# Patient Record
Sex: Female | Born: 1945
Health system: Southern US, Community
[De-identification: ages and names within clinical notes are randomized; demographics above are authoritative.]

## PROBLEM LIST (undated history)

## (undated) DIAGNOSIS — I10 Essential (primary) hypertension: Secondary | ICD-10-CM

## (undated) DIAGNOSIS — M545 Low back pain, unspecified: Secondary | ICD-10-CM

## (undated) DIAGNOSIS — R7309 Other abnormal glucose: Secondary | ICD-10-CM

## (undated) DIAGNOSIS — M5430 Sciatica, unspecified side: Secondary | ICD-10-CM

## (undated) DIAGNOSIS — E7800 Pure hypercholesterolemia, unspecified: Secondary | ICD-10-CM

## (undated) DIAGNOSIS — E559 Vitamin D deficiency, unspecified: Secondary | ICD-10-CM

## (undated) DIAGNOSIS — M810 Age-related osteoporosis without current pathological fracture: Secondary | ICD-10-CM

## (undated) HISTORY — DX: Essential (primary) hypertension: I10

## (undated) HISTORY — DX: Low back pain: M54.5

## (undated) HISTORY — DX: Vitamin D deficiency, unspecified: E55.9

## (undated) HISTORY — PX: TUBAL LIGATION: SHX77

## (undated) HISTORY — DX: Low back pain, unspecified: M54.50

## (undated) HISTORY — DX: Pure hypercholesterolemia, unspecified: E78.00

## (undated) HISTORY — DX: Other abnormal glucose: R73.09

## (undated) HISTORY — DX: Age-related osteoporosis without current pathological fracture: M81.0

## (undated) HISTORY — DX: Sciatica, unspecified side: M54.30

---

## 2007-12-22 ENCOUNTER — Emergency Department (HOSPITAL_COMMUNITY): Admission: EM | Admit: 2007-12-22 | Discharge: 2007-12-22 | Payer: Self-pay | Admitting: Emergency Medicine

## 2010-12-07 LAB — DIFFERENTIAL
Eosinophils Absolute: 0
Eosinophils Relative: 1
Lymphs Abs: 1.6
Monocytes Absolute: 0.4
Monocytes Relative: 6

## 2010-12-07 LAB — CBC
HCT: 43.7
Hemoglobin: 14.2
MCV: 91.1
RBC: 4.8
RDW: 13.4
WBC: 6.5

## 2010-12-07 LAB — POCT CARDIAC MARKERS
CKMB, poc: 1.4
Myoglobin, poc: 56.8
Troponin i, poc: <0.05

## 2010-12-07 LAB — POCT I-STAT, CHEM 8
Glucose, Bld: 112 — ABNORMAL HIGH
Potassium: 3.1 — ABNORMAL LOW
Sodium: 143
TCO2: 27

## 2010-12-07 LAB — URINALYSIS, ROUTINE W REFLEX MICROSCOPIC
Nitrite: NEGATIVE
Specific Gravity, Urine: 1.009
Urobilinogen, UA: 0.2

## 2010-12-07 LAB — URINE MICROSCOPIC-ADD ON

## 2012-02-15 DIAGNOSIS — E785 Hyperlipidemia, unspecified: Secondary | ICD-10-CM | POA: Diagnosis not present

## 2012-02-15 DIAGNOSIS — Z79899 Other long term (current) drug therapy: Secondary | ICD-10-CM | POA: Diagnosis not present

## 2012-02-15 DIAGNOSIS — I10 Essential (primary) hypertension: Secondary | ICD-10-CM | POA: Diagnosis not present

## 2012-02-21 DIAGNOSIS — I10 Essential (primary) hypertension: Secondary | ICD-10-CM | POA: Diagnosis not present

## 2012-03-29 DIAGNOSIS — I10 Essential (primary) hypertension: Secondary | ICD-10-CM | POA: Diagnosis not present

## 2012-03-29 DIAGNOSIS — R002 Palpitations: Secondary | ICD-10-CM | POA: Diagnosis not present

## 2012-03-29 DIAGNOSIS — R0602 Shortness of breath: Secondary | ICD-10-CM | POA: Diagnosis not present

## 2012-03-29 DIAGNOSIS — I4510 Unspecified right bundle-branch block: Secondary | ICD-10-CM | POA: Diagnosis not present

## 2012-04-09 DIAGNOSIS — Q211 Atrial septal defect: Secondary | ICD-10-CM | POA: Diagnosis not present

## 2012-04-09 DIAGNOSIS — I4510 Unspecified right bundle-branch block: Secondary | ICD-10-CM | POA: Diagnosis not present

## 2012-04-09 DIAGNOSIS — R0602 Shortness of breath: Secondary | ICD-10-CM | POA: Diagnosis not present

## 2012-04-12 DIAGNOSIS — I4510 Unspecified right bundle-branch block: Secondary | ICD-10-CM | POA: Diagnosis not present

## 2012-04-12 DIAGNOSIS — R0602 Shortness of breath: Secondary | ICD-10-CM | POA: Diagnosis not present

## 2012-04-12 DIAGNOSIS — Q211 Atrial septal defect: Secondary | ICD-10-CM | POA: Diagnosis not present

## 2012-04-18 DIAGNOSIS — R0602 Shortness of breath: Secondary | ICD-10-CM | POA: Diagnosis not present

## 2012-04-18 DIAGNOSIS — E785 Hyperlipidemia, unspecified: Secondary | ICD-10-CM | POA: Diagnosis not present

## 2012-04-18 DIAGNOSIS — Z8249 Family history of ischemic heart disease and other diseases of the circulatory system: Secondary | ICD-10-CM | POA: Diagnosis not present

## 2012-04-18 DIAGNOSIS — Z79899 Other long term (current) drug therapy: Secondary | ICD-10-CM | POA: Diagnosis not present

## 2012-04-18 DIAGNOSIS — R002 Palpitations: Secondary | ICD-10-CM | POA: Diagnosis not present

## 2012-04-18 DIAGNOSIS — I10 Essential (primary) hypertension: Secondary | ICD-10-CM | POA: Diagnosis not present

## 2012-05-01 DIAGNOSIS — I10 Essential (primary) hypertension: Secondary | ICD-10-CM | POA: Diagnosis not present

## 2012-05-01 DIAGNOSIS — R0602 Shortness of breath: Secondary | ICD-10-CM | POA: Diagnosis not present

## 2012-05-16 DIAGNOSIS — I10 Essential (primary) hypertension: Secondary | ICD-10-CM | POA: Diagnosis not present

## 2012-05-30 DIAGNOSIS — I10 Essential (primary) hypertension: Secondary | ICD-10-CM | POA: Diagnosis not present

## 2012-05-30 DIAGNOSIS — M5430 Sciatica, unspecified side: Secondary | ICD-10-CM | POA: Diagnosis not present

## 2012-05-30 DIAGNOSIS — M545 Low back pain: Secondary | ICD-10-CM | POA: Diagnosis not present

## 2012-07-12 DIAGNOSIS — IMO0002 Reserved for concepts with insufficient information to code with codable children: Secondary | ICD-10-CM | POA: Diagnosis not present

## 2012-07-12 DIAGNOSIS — M545 Low back pain: Secondary | ICD-10-CM | POA: Diagnosis not present

## 2012-07-12 DIAGNOSIS — M5430 Sciatica, unspecified side: Secondary | ICD-10-CM | POA: Diagnosis not present

## 2012-07-12 DIAGNOSIS — I10 Essential (primary) hypertension: Secondary | ICD-10-CM | POA: Diagnosis not present

## 2012-07-26 DIAGNOSIS — M545 Low back pain: Secondary | ICD-10-CM | POA: Diagnosis not present

## 2012-07-26 DIAGNOSIS — I10 Essential (primary) hypertension: Secondary | ICD-10-CM | POA: Diagnosis not present

## 2012-08-08 ENCOUNTER — Ambulatory Visit (INDEPENDENT_AMBULATORY_CARE_PROVIDER_SITE_OTHER): Payer: Medicare Other | Admitting: Neurology

## 2012-08-08 ENCOUNTER — Encounter: Payer: Self-pay | Admitting: Neurology

## 2012-08-08 VITALS — BP 148/88 | HR 79 | Ht 64.0 in | Wt 150.0 lb

## 2012-08-08 DIAGNOSIS — M545 Low back pain, unspecified: Secondary | ICD-10-CM

## 2012-08-08 MED ORDER — DICLOFENAC SODIUM 1 % TD GEL: 4.0000 g | Freq: Four times a day (QID) | TRANSDERMAL | Status: DC

## 2012-08-08 NOTE — Progress Notes (Signed)
History of present illness: Julia Gates is a 67 years old right-handed African American female, accompanied by her husband, referred by her primary care physician Dr. Andi Devon for evaluation of low back pain  She denies previous history of low back pain, in March 2014, while travelling, she dragged a heavy suitcase crossing the belt, twisted her right back, 2 days later, she began to experience excruciating right-sided low back pain, radiating to her right buttock area, also along right lateral thigh, lateral leg, to right ankle, involving right lateral 3 toes, The intense pain lasts for couple months, was helped by celebrax and Percocet ,she has gait difficulty attributed to pain,  She denied left t leg symptoms, denied bowel bladder incontinence, she reported 50% improvement now  Review of Systems  Out of a complete 14 system review, the patient complains of only the following symptoms, and all other reviewed systems are negative.   Constitutional:   N/A Cardiovascular:  Palpitation Nose/Throat:  N/A Skin:  birthmark, moles  Eyes: N/A Respiratory:  snoring  Gastroitestinal: N/A    Hematology/Lymphatic:  N/A Endocrine:  N/A Musculoskeletal: joints pain  Allergy/Immunology: N/A Neurological:  dizziness, snoring Psychiatric:    N/A  PHYSICAL EXAMINATOINS:  Generalized: In no acute distress  Neck: Supple, no carotid bruits   Cardiac: Regular rate rhythm  Pulmonary: Clear to auscultation bilaterally  Musculoskeletal: No deformity  Neurological examination  Mentation: Alert oriented to time, place, history taking, and causual conversation  Cranial nerve II-XII: Pupils were equal round reactive to light extraocular movements were full, visual field were full on confrontational test. facial sensation and strength were normal. hearing was intact to finger rubbing bilaterally. Uvula tongue midline.  head turning and shoulder shrug and were normal and symmetric.Tongue protrusion into  cheek strength was normal.  Motor: normal tone, bulk and strength, with exception of mild right toe extension, right ankle dorsi flexion, eversion weakness   Sensory: Intact to fine touch, pinprick, preserved vibratory sensation, and proprioception at toes.  Coordination: Normal finger to nose, heel-to-shin bilaterally there was no truncal ataxia  Gait: atalgic due to right back pain, she has difficulty with right heel walking, able to walk on tiptoe  Romberg signs: Negative  Deep tendon reflexes: Brachioradialis 2/2, biceps 2/2, triceps 2/2, patellar 2/2, Achilles 2/2, plantar responses were flexor bilaterally.  Assessment and plan:    67 years old [place] American female, with acute onset of right-sided low back pain, radiating pain to her right lateral leg, positive right straight leg testing, preserved bilaterally ankle reflexes, mild right ankle dorsi flexion, toe flexion, ankle eversion weakness,   1. Most consistent with acute right L5  lumbar radiculopathy 2. MRI lumbar, EMG nerve conduction study 3. Celexa, Percocet as needed, Voltaren gel, hot compression  4. RTC

## 2012-08-16 DIAGNOSIS — Z01419 Encounter for gynecological examination (general) (routine) without abnormal findings: Secondary | ICD-10-CM | POA: Diagnosis not present

## 2012-08-16 DIAGNOSIS — E559 Vitamin D deficiency, unspecified: Secondary | ICD-10-CM | POA: Diagnosis not present

## 2012-08-16 DIAGNOSIS — E785 Hyperlipidemia, unspecified: Secondary | ICD-10-CM | POA: Diagnosis not present

## 2012-08-16 DIAGNOSIS — M5430 Sciatica, unspecified side: Secondary | ICD-10-CM | POA: Diagnosis not present

## 2012-08-16 DIAGNOSIS — R7309 Other abnormal glucose: Secondary | ICD-10-CM | POA: Diagnosis not present

## 2012-08-16 DIAGNOSIS — Z124 Encounter for screening for malignant neoplasm of cervix: Secondary | ICD-10-CM | POA: Diagnosis not present

## 2012-08-16 DIAGNOSIS — Z23 Encounter for immunization: Secondary | ICD-10-CM | POA: Diagnosis not present

## 2012-08-16 DIAGNOSIS — I10 Essential (primary) hypertension: Secondary | ICD-10-CM | POA: Diagnosis not present

## 2012-08-16 DIAGNOSIS — Z0000 Encounter for general adult medical examination without abnormal findings: Secondary | ICD-10-CM | POA: Diagnosis not present

## 2012-08-19 ENCOUNTER — Ambulatory Visit
Admission: RE | Admit: 2012-08-19 | Discharge: 2012-08-19 | Disposition: A | Discharge status: Home / Self Care | Payer: Medicare Other | Source: Ambulatory Visit | Attending: Neurology | Admitting: Neurology

## 2012-08-19 DIAGNOSIS — M545 Low back pain: Secondary | ICD-10-CM

## 2012-08-20 ENCOUNTER — Encounter (INDEPENDENT_AMBULATORY_CARE_PROVIDER_SITE_OTHER): Payer: Medicare Other | Admitting: Radiology

## 2012-08-20 ENCOUNTER — Ambulatory Visit (INDEPENDENT_AMBULATORY_CARE_PROVIDER_SITE_OTHER): Payer: Medicare Other | Admitting: Neurology

## 2012-08-20 DIAGNOSIS — M545 Low back pain: Secondary | ICD-10-CM | POA: Diagnosis not present

## 2012-08-20 DIAGNOSIS — Z0289 Encounter for other administrative examinations: Secondary | ICD-10-CM

## 2012-08-20 NOTE — Procedures (Signed)
History of present illness: 67 years old African American female complains of right-sided low back pain, radiating pain to right lower extremity since March 2014.  Nerve conduction studies:  Bilateral sural sensory responses were normal, bilateral peroneal sensory responses were normal. Bilateral tibial motor responses were normal. Tibial H reflexes were normal and symmetric symmetric. Right peroneal to EDB motor responses were normal. Left peroneal to EDB motor response showed mildly decreased CMAP amplitude, with normal distal latency, conduction velocity, and F wave latency.  Electromyography:  Selected needle examination was performed at right lower extremity muscles, and right lumbosacral paraspinal muscles.  Needle examination of right tibialis anterior, tibialis posterior, medial gastrocnemius, peroneal longus, vastus lateralis, biceps femoris short head was normal.  There was no spontaneous activity at right lumbosacral paraspinal muscles, right L4, 5, S1.   IN CONCLUSION: This is a normal study. There is no electrodiagnostic evidence of large fiber peripheral neuropathy, right lower extremity neuropathy, or right lumbosacral radiculopathy.

## 2012-08-23 ENCOUNTER — Telehealth: Payer: Self-pay | Admitting: Neurology

## 2012-08-23 NOTE — Telephone Encounter (Signed)
I have left message for her   At L5-S1: Pseudo-disc bulging, facet arthropathy with severe right and moderate left foraminal stenosis; no spinal stenosis; mild anterior spondylolisthesis of L5 on S1 measuring 3 mm.  2. At L1-2, L2-3, L3-4, L4-5: disc bulging, facet hypertrophic mild bilateral foraminal stenosis; no spinal stenosis. 3. Incidental right renal cyst (9 mm).  Please call her again, also check on her low back pain, if she continues to have low back pain, may refer her to [facility] pain management

## 2012-08-29 DIAGNOSIS — R0602 Shortness of breath: Secondary | ICD-10-CM | POA: Diagnosis not present

## 2012-08-29 DIAGNOSIS — I4510 Unspecified right bundle-branch block: Secondary | ICD-10-CM | POA: Diagnosis not present

## 2012-08-29 DIAGNOSIS — I10 Essential (primary) hypertension: Secondary | ICD-10-CM | POA: Diagnosis not present

## 2012-08-30 ENCOUNTER — Encounter: Payer: Self-pay | Admitting: Neurology

## 2012-08-31 ENCOUNTER — Telehealth: Payer: Self-pay | Admitting: Neurology

## 2012-08-31 DIAGNOSIS — M5416 Radiculopathy, lumbar region: Secondary | ICD-10-CM

## 2012-08-31 NOTE — Telephone Encounter (Signed)
I have left message :

## 2012-09-03 ENCOUNTER — Telehealth: Payer: Self-pay | Admitting: Neurology

## 2012-09-03 NOTE — Telephone Encounter (Signed)
I have left message 3rd time for her to call back.

## 2012-09-04 ENCOUNTER — Telehealth: Payer: Self-pay | Admitting: Neurology

## 2012-09-04 NOTE — Telephone Encounter (Signed)
Message copied by Levert Feinstein on Tue Sep 04, 2012  5:32 PM ------      Message from: Warren Lacy A      Created: Tue Sep 04, 2012 11:05 AM       Returning your call about her MRI ------

## 2012-09-04 NOTE — Telephone Encounter (Signed)
I have called her and explained MRI findings to her, she will see orthopedic surgeon in July 8th 2014.Marland Kitchen

## 2012-09-11 ENCOUNTER — Ambulatory Visit: Payer: Medicare Other

## 2012-10-10 ENCOUNTER — Other Ambulatory Visit: Payer: Self-pay

## 2012-10-30 DIAGNOSIS — R110 Nausea: Secondary | ICD-10-CM | POA: Diagnosis not present

## 2012-10-30 DIAGNOSIS — Z1211 Encounter for screening for malignant neoplasm of colon: Secondary | ICD-10-CM | POA: Diagnosis not present

## 2012-10-30 DIAGNOSIS — Z800 Family history of malignant neoplasm of digestive organs: Secondary | ICD-10-CM | POA: Diagnosis not present

## 2013-01-10 ENCOUNTER — Other Ambulatory Visit: Payer: Self-pay

## 2013-02-26 DIAGNOSIS — I10 Essential (primary) hypertension: Secondary | ICD-10-CM | POA: Diagnosis not present

## 2013-02-26 DIAGNOSIS — Z79899 Other long term (current) drug therapy: Secondary | ICD-10-CM | POA: Diagnosis not present

## 2013-02-26 DIAGNOSIS — R7309 Other abnormal glucose: Secondary | ICD-10-CM | POA: Diagnosis not present

## 2013-02-26 DIAGNOSIS — M25519 Pain in unspecified shoulder: Secondary | ICD-10-CM | POA: Diagnosis not present

## 2013-08-28 DIAGNOSIS — R0602 Shortness of breath: Secondary | ICD-10-CM | POA: Diagnosis not present

## 2013-08-28 DIAGNOSIS — I10 Essential (primary) hypertension: Secondary | ICD-10-CM | POA: Diagnosis not present

## 2013-08-28 DIAGNOSIS — I4510 Unspecified right bundle-branch block: Secondary | ICD-10-CM | POA: Diagnosis not present

## 2013-08-28 DIAGNOSIS — E785 Hyperlipidemia, unspecified: Secondary | ICD-10-CM | POA: Diagnosis not present

## 2013-09-27 DIAGNOSIS — I10 Essential (primary) hypertension: Secondary | ICD-10-CM | POA: Diagnosis not present

## 2013-09-27 DIAGNOSIS — R0602 Shortness of breath: Secondary | ICD-10-CM | POA: Diagnosis not present

## 2013-09-27 DIAGNOSIS — E785 Hyperlipidemia, unspecified: Secondary | ICD-10-CM | POA: Diagnosis not present

## 2013-10-02 DIAGNOSIS — I10 Essential (primary) hypertension: Secondary | ICD-10-CM | POA: Diagnosis not present

## 2013-10-02 DIAGNOSIS — E785 Hyperlipidemia, unspecified: Secondary | ICD-10-CM | POA: Diagnosis not present

## 2013-10-02 DIAGNOSIS — R0602 Shortness of breath: Secondary | ICD-10-CM | POA: Diagnosis not present

## 2013-11-14 DIAGNOSIS — R0602 Shortness of breath: Secondary | ICD-10-CM | POA: Diagnosis not present

## 2013-11-14 DIAGNOSIS — I10 Essential (primary) hypertension: Secondary | ICD-10-CM | POA: Diagnosis not present

## 2013-12-02 DIAGNOSIS — I10 Essential (primary) hypertension: Secondary | ICD-10-CM | POA: Diagnosis not present

## 2013-12-05 DIAGNOSIS — I4510 Unspecified right bundle-branch block: Secondary | ICD-10-CM | POA: Diagnosis not present

## 2013-12-05 DIAGNOSIS — R0602 Shortness of breath: Secondary | ICD-10-CM | POA: Diagnosis not present

## 2013-12-05 DIAGNOSIS — I10 Essential (primary) hypertension: Secondary | ICD-10-CM | POA: Diagnosis not present

## 2013-12-09 DIAGNOSIS — N182 Chronic kidney disease, stage 2 (mild): Secondary | ICD-10-CM | POA: Diagnosis not present

## 2013-12-09 DIAGNOSIS — I129 Hypertensive chronic kidney disease with stage 1 through stage 4 chronic kidney disease, or unspecified chronic kidney disease: Secondary | ICD-10-CM | POA: Diagnosis not present

## 2013-12-09 DIAGNOSIS — I10 Essential (primary) hypertension: Secondary | ICD-10-CM | POA: Diagnosis not present

## 2013-12-09 DIAGNOSIS — R0602 Shortness of breath: Secondary | ICD-10-CM | POA: Diagnosis not present

## 2013-12-12 DIAGNOSIS — R400 Somnolence: Secondary | ICD-10-CM | POA: Diagnosis not present

## 2013-12-16 DIAGNOSIS — R400 Somnolence: Secondary | ICD-10-CM | POA: Diagnosis not present

## 2013-12-31 DIAGNOSIS — I10 Essential (primary) hypertension: Secondary | ICD-10-CM | POA: Diagnosis not present

## 2014-01-09 DIAGNOSIS — I10 Essential (primary) hypertension: Secondary | ICD-10-CM | POA: Diagnosis not present

## 2014-01-17 DIAGNOSIS — I10 Essential (primary) hypertension: Secondary | ICD-10-CM | POA: Diagnosis not present

## 2014-01-17 DIAGNOSIS — G4734 Idiopathic sleep related nonobstructive alveolar hypoventilation: Secondary | ICD-10-CM | POA: Diagnosis not present

## 2014-01-17 DIAGNOSIS — N182 Chronic kidney disease, stage 2 (mild): Secondary | ICD-10-CM | POA: Diagnosis not present

## 2014-01-17 DIAGNOSIS — I129 Hypertensive chronic kidney disease with stage 1 through stage 4 chronic kidney disease, or unspecified chronic kidney disease: Secondary | ICD-10-CM | POA: Diagnosis not present

## 2014-03-11 DIAGNOSIS — I10 Essential (primary) hypertension: Secondary | ICD-10-CM | POA: Diagnosis not present

## 2014-03-11 DIAGNOSIS — E780 Pure hypercholesterolemia: Secondary | ICD-10-CM | POA: Diagnosis not present

## 2014-03-20 DIAGNOSIS — I10 Essential (primary) hypertension: Secondary | ICD-10-CM | POA: Diagnosis not present

## 2014-03-20 DIAGNOSIS — N182 Chronic kidney disease, stage 2 (mild): Secondary | ICD-10-CM | POA: Diagnosis not present

## 2014-03-20 DIAGNOSIS — I701 Atherosclerosis of renal artery: Secondary | ICD-10-CM | POA: Diagnosis not present

## 2014-03-20 DIAGNOSIS — I129 Hypertensive chronic kidney disease with stage 1 through stage 4 chronic kidney disease, or unspecified chronic kidney disease: Secondary | ICD-10-CM | POA: Diagnosis not present

## 2014-09-18 DIAGNOSIS — I10 Essential (primary) hypertension: Secondary | ICD-10-CM | POA: Diagnosis not present

## 2014-09-18 DIAGNOSIS — E780 Pure hypercholesterolemia: Secondary | ICD-10-CM | POA: Diagnosis not present

## 2014-09-25 DIAGNOSIS — R0602 Shortness of breath: Secondary | ICD-10-CM | POA: Diagnosis not present

## 2014-09-25 DIAGNOSIS — I129 Hypertensive chronic kidney disease with stage 1 through stage 4 chronic kidney disease, or unspecified chronic kidney disease: Secondary | ICD-10-CM | POA: Diagnosis not present

## 2014-09-25 DIAGNOSIS — E780 Pure hypercholesterolemia: Secondary | ICD-10-CM | POA: Diagnosis not present

## 2014-09-25 DIAGNOSIS — I10 Essential (primary) hypertension: Secondary | ICD-10-CM | POA: Diagnosis not present

## 2014-12-10 DIAGNOSIS — D519 Vitamin B12 deficiency anemia, unspecified: Secondary | ICD-10-CM | POA: Diagnosis not present

## 2014-12-10 DIAGNOSIS — E559 Vitamin D deficiency, unspecified: Secondary | ICD-10-CM | POA: Diagnosis not present

## 2014-12-10 DIAGNOSIS — Z8249 Family history of ischemic heart disease and other diseases of the circulatory system: Secondary | ICD-10-CM | POA: Diagnosis not present

## 2014-12-10 DIAGNOSIS — Z1239 Encounter for other screening for malignant neoplasm of breast: Secondary | ICD-10-CM | POA: Diagnosis not present

## 2014-12-10 DIAGNOSIS — Z23 Encounter for immunization: Secondary | ICD-10-CM | POA: Diagnosis not present

## 2014-12-10 DIAGNOSIS — I10 Essential (primary) hypertension: Secondary | ICD-10-CM | POA: Diagnosis not present

## 2014-12-10 DIAGNOSIS — Z1211 Encounter for screening for malignant neoplasm of colon: Secondary | ICD-10-CM | POA: Diagnosis not present

## 2014-12-10 DIAGNOSIS — R7309 Other abnormal glucose: Secondary | ICD-10-CM | POA: Diagnosis not present

## 2014-12-10 DIAGNOSIS — Z1389 Encounter for screening for other disorder: Secondary | ICD-10-CM | POA: Diagnosis not present

## 2014-12-22 ENCOUNTER — Other Ambulatory Visit: Payer: Self-pay

## 2014-12-22 DIAGNOSIS — Z1231 Encounter for screening mammogram for malignant neoplasm of breast: Secondary | ICD-10-CM

## 2015-02-09 ENCOUNTER — Ambulatory Visit
Admission: RE | Admit: 2015-02-09 | Discharge: 2015-02-09 | Disposition: A | Discharge status: Home / Self Care | Payer: Medicare Other | Source: Ambulatory Visit

## 2015-02-09 DIAGNOSIS — Z1231 Encounter for screening mammogram for malignant neoplasm of breast: Secondary | ICD-10-CM

## 2015-04-15 DIAGNOSIS — E784 Other hyperlipidemia: Secondary | ICD-10-CM | POA: Diagnosis not present

## 2015-04-15 DIAGNOSIS — E119 Type 2 diabetes mellitus without complications: Secondary | ICD-10-CM | POA: Diagnosis not present

## 2015-04-15 DIAGNOSIS — I10 Essential (primary) hypertension: Secondary | ICD-10-CM | POA: Diagnosis not present

## 2015-04-23 DIAGNOSIS — I129 Hypertensive chronic kidney disease with stage 1 through stage 4 chronic kidney disease, or unspecified chronic kidney disease: Secondary | ICD-10-CM | POA: Diagnosis not present

## 2015-04-23 DIAGNOSIS — I10 Essential (primary) hypertension: Secondary | ICD-10-CM | POA: Diagnosis not present

## 2015-04-23 DIAGNOSIS — N181 Chronic kidney disease, stage 1: Secondary | ICD-10-CM | POA: Diagnosis not present

## 2015-04-23 DIAGNOSIS — I701 Atherosclerosis of renal artery: Secondary | ICD-10-CM | POA: Diagnosis not present

## 2015-05-13 DIAGNOSIS — I10 Essential (primary) hypertension: Secondary | ICD-10-CM | POA: Diagnosis not present

## 2015-06-05 DIAGNOSIS — R079 Chest pain, unspecified: Secondary | ICD-10-CM | POA: Diagnosis not present

## 2015-06-05 DIAGNOSIS — R0602 Shortness of breath: Secondary | ICD-10-CM | POA: Diagnosis not present

## 2015-06-05 DIAGNOSIS — I10 Essential (primary) hypertension: Secondary | ICD-10-CM | POA: Diagnosis not present

## 2015-06-05 DIAGNOSIS — I701 Atherosclerosis of renal artery: Secondary | ICD-10-CM | POA: Diagnosis not present

## 2015-06-15 DIAGNOSIS — R0789 Other chest pain: Secondary | ICD-10-CM | POA: Diagnosis not present

## 2015-06-15 DIAGNOSIS — R0602 Shortness of breath: Secondary | ICD-10-CM | POA: Diagnosis not present

## 2015-06-25 DIAGNOSIS — R0602 Shortness of breath: Secondary | ICD-10-CM | POA: Diagnosis not present

## 2015-06-25 DIAGNOSIS — E119 Type 2 diabetes mellitus without complications: Secondary | ICD-10-CM | POA: Diagnosis not present

## 2015-06-25 DIAGNOSIS — I10 Essential (primary) hypertension: Secondary | ICD-10-CM | POA: Diagnosis not present

## 2015-06-25 DIAGNOSIS — R079 Chest pain, unspecified: Secondary | ICD-10-CM | POA: Diagnosis not present

## 2015-07-15 DIAGNOSIS — I10 Essential (primary) hypertension: Secondary | ICD-10-CM | POA: Diagnosis not present

## 2015-07-15 DIAGNOSIS — R7309 Other abnormal glucose: Secondary | ICD-10-CM | POA: Diagnosis not present

## 2015-07-15 DIAGNOSIS — E784 Other hyperlipidemia: Secondary | ICD-10-CM | POA: Diagnosis not present

## 2015-07-15 DIAGNOSIS — R0600 Dyspnea, unspecified: Secondary | ICD-10-CM | POA: Diagnosis not present

## 2015-10-21 DIAGNOSIS — I10 Essential (primary) hypertension: Secondary | ICD-10-CM | POA: Diagnosis not present

## 2015-10-21 DIAGNOSIS — E119 Type 2 diabetes mellitus without complications: Secondary | ICD-10-CM | POA: Diagnosis not present

## 2015-12-16 DIAGNOSIS — Z1211 Encounter for screening for malignant neoplasm of colon: Secondary | ICD-10-CM | POA: Diagnosis not present

## 2015-12-16 DIAGNOSIS — Z13820 Encounter for screening for osteoporosis: Secondary | ICD-10-CM | POA: Diagnosis not present

## 2015-12-16 DIAGNOSIS — R7309 Other abnormal glucose: Secondary | ICD-10-CM | POA: Diagnosis not present

## 2015-12-16 DIAGNOSIS — Z23 Encounter for immunization: Secondary | ICD-10-CM | POA: Diagnosis not present

## 2015-12-16 DIAGNOSIS — Z0000 Encounter for general adult medical examination without abnormal findings: Secondary | ICD-10-CM | POA: Diagnosis not present

## 2015-12-16 DIAGNOSIS — I10 Essential (primary) hypertension: Secondary | ICD-10-CM | POA: Diagnosis not present

## 2015-12-21 DIAGNOSIS — R0602 Shortness of breath: Secondary | ICD-10-CM | POA: Diagnosis not present

## 2015-12-21 DIAGNOSIS — E119 Type 2 diabetes mellitus without complications: Secondary | ICD-10-CM | POA: Diagnosis not present

## 2015-12-21 DIAGNOSIS — I10 Essential (primary) hypertension: Secondary | ICD-10-CM | POA: Diagnosis not present

## 2015-12-21 DIAGNOSIS — E785 Hyperlipidemia, unspecified: Secondary | ICD-10-CM | POA: Diagnosis not present

## 2016-01-21 DIAGNOSIS — R152 Fecal urgency: Secondary | ICD-10-CM | POA: Diagnosis not present

## 2016-01-21 DIAGNOSIS — R194 Change in bowel habit: Secondary | ICD-10-CM | POA: Diagnosis not present

## 2016-01-21 DIAGNOSIS — R140 Abdominal distension (gaseous): Secondary | ICD-10-CM | POA: Diagnosis not present

## 2016-01-21 DIAGNOSIS — Z1211 Encounter for screening for malignant neoplasm of colon: Secondary | ICD-10-CM | POA: Diagnosis not present

## 2016-01-21 DIAGNOSIS — K219 Gastro-esophageal reflux disease without esophagitis: Secondary | ICD-10-CM | POA: Diagnosis not present

## 2016-03-16 DIAGNOSIS — Z9114 Patient's other noncompliance with medication regimen: Secondary | ICD-10-CM | POA: Diagnosis not present

## 2016-03-16 DIAGNOSIS — E119 Type 2 diabetes mellitus without complications: Secondary | ICD-10-CM | POA: Diagnosis not present

## 2016-03-16 DIAGNOSIS — I10 Essential (primary) hypertension: Secondary | ICD-10-CM | POA: Diagnosis not present

## 2016-04-06 DIAGNOSIS — R194 Change in bowel habit: Secondary | ICD-10-CM | POA: Diagnosis not present

## 2016-04-06 DIAGNOSIS — D125 Benign neoplasm of sigmoid colon: Secondary | ICD-10-CM | POA: Diagnosis not present

## 2016-04-06 DIAGNOSIS — Z1211 Encounter for screening for malignant neoplasm of colon: Secondary | ICD-10-CM | POA: Diagnosis not present

## 2016-04-06 DIAGNOSIS — K635 Polyp of colon: Secondary | ICD-10-CM | POA: Diagnosis not present

## 2016-04-06 DIAGNOSIS — D123 Benign neoplasm of transverse colon: Secondary | ICD-10-CM | POA: Diagnosis not present

## 2016-04-06 LAB — HM COLONOSCOPY

## 2016-04-14 DIAGNOSIS — H25813 Combined forms of age-related cataract, bilateral: Secondary | ICD-10-CM | POA: Diagnosis not present

## 2016-04-14 DIAGNOSIS — E119 Type 2 diabetes mellitus without complications: Secondary | ICD-10-CM | POA: Diagnosis not present

## 2016-06-23 DIAGNOSIS — I10 Essential (primary) hypertension: Secondary | ICD-10-CM | POA: Diagnosis not present

## 2016-06-23 DIAGNOSIS — M79604 Pain in right leg: Secondary | ICD-10-CM | POA: Diagnosis not present

## 2016-06-23 DIAGNOSIS — E784 Other hyperlipidemia: Secondary | ICD-10-CM | POA: Diagnosis not present

## 2016-06-23 DIAGNOSIS — E119 Type 2 diabetes mellitus without complications: Secondary | ICD-10-CM | POA: Diagnosis not present

## 2016-09-14 DIAGNOSIS — R0600 Dyspnea, unspecified: Secondary | ICD-10-CM | POA: Diagnosis not present

## 2016-09-14 DIAGNOSIS — E119 Type 2 diabetes mellitus without complications: Secondary | ICD-10-CM | POA: Diagnosis not present

## 2016-09-14 DIAGNOSIS — I10 Essential (primary) hypertension: Secondary | ICD-10-CM | POA: Diagnosis not present

## 2016-09-26 ENCOUNTER — Encounter: Payer: Self-pay | Admitting: Pulmonary Disease

## 2016-09-26 ENCOUNTER — Ambulatory Visit (INDEPENDENT_AMBULATORY_CARE_PROVIDER_SITE_OTHER)
Admission: RE | Admit: 2016-09-26 | Discharge: 2016-09-26 | Disposition: A | Discharge status: Home / Self Care | Payer: Medicare Other | Source: Ambulatory Visit | Attending: Pulmonary Disease | Admitting: Pulmonary Disease

## 2016-09-26 ENCOUNTER — Ambulatory Visit (INDEPENDENT_AMBULATORY_CARE_PROVIDER_SITE_OTHER): Payer: Medicare Other | Admitting: Pulmonary Disease

## 2016-09-26 VITALS — BP 122/70 | HR 56 | Ht 64.0 in | Wt 154.0 lb

## 2016-09-26 DIAGNOSIS — R0600 Dyspnea, unspecified: Secondary | ICD-10-CM

## 2016-09-26 DIAGNOSIS — R05 Cough: Secondary | ICD-10-CM | POA: Diagnosis not present

## 2016-09-26 NOTE — Progress Notes (Signed)
Subjective:    Patient ID: Julia Gates, female    DOB: 17-Jan-1946, 71 y.o.   MRN: [id number]  HPI  Chief Complaint  Patient presents with  . Consult    Increased SOB during the Summer months. States that when she is lying down flat, she is gasping for air. Notices it some in the winter months but not as bad as the Summer. Has been going on for the past 5 years. Non-productive cough, the urge to cough.     71 year old never smoker presents for evaluation of increased shortness of breath especially during the summer months. She can walk about a mile or 2 per day although she admits that she has become more sedentary recently however when she lies down to sleep at night, she cannot lie down flat and has to sleep elevated. She has noticed this for 5-6 years but feels that this is progressing. She has always slept with 2-3 pillows but has her bed elevated.  For the past few weeks, she also indicates a dry cough that starts off as a tickle in her throat. She reports occasional heartburn which she takes herbal remedies for this. She denies wheezing, sputum production or frequent chest colds. She is a diabetic, hypertensive and is on an ARB.  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 She lived in [place] most of her life and in [place] for the last 20 years  She reports a negative treadmill stress test by her cardiologist about a year ago. She also reports nocturnal oximetry which showed drop in her oxygen levels but is not interested in pursuing this test at this time   Spirometry was poor effort, for seconds, FEV1 90% and FVC 76% probably related to effort, no evidence of obstruction    Past Medical History:  Diagnosis Date  . Abnormal glucose level   . Benign essential HTN   . High blood pressure   . High cholesterol   . Lumbago   . Osteoporosis   . Sciatica   . Vitamin D deficiency      No past surgical history on file.   Allergies  Allergen Reactions  . Penicillins     Social History   Social History  . Marital status: Married    Spouse name: N/A  . Number of children: 3  . Years of education: N/A   Occupational History  . Not on file.   Social History Main Topics  . Smoking status: Never Smoker  . Smokeless tobacco: Never Used  . Alcohol use No     Comment: drinks tea occ  . Drug use: No  . Sexual activity: Not on file   Other Topics Concern  . Not on file   Social History Narrative   Patient lives at home with her husband and she is retired. She has a high school education and 3 children    Family History  Problem Relation Age of Onset  . Heart disease Mother   . Diabetes Mother   . Diabetes Brother   . Diabetes Sister      Review of Systems   Positive for shortness of breath with activity, dentures, nasal congestion, anxiety and leg pain  Constitutional: negative for anorexia, fevers and sweats  Eyes: negative for irritation, redness and visual disturbance  Ears, nose, mouth, throat, and face: negative for earaches, epistaxis, nasal congestion and sore throat  Respiratory:  negative for cough, dyspnea on exertion, sputum and wheezing  Cardiovascular: negative for chest pain, dyspnea, lower extremity edema, orthopnea, palpitations and syncope  Gastrointestinal: negative for abdominal pain, constipation, diarrhea, melena, nausea and vomiting  Genitourinary:negative for dysuria, frequency and hematuria  Hematologic/lymphatic: negative for bleeding, easy bruising and lymphadenopathy  Musculoskeletal:negative for arthralgias, muscle weakness and stiff joints  Neurological: negative for coordination problems, gait problems, headaches and weakness  Endocrine: negative for diabetic symptoms including polydipsia, polyuria and weight loss     Objective:   Physical Exam  Gen. Pleasant, well-nourished, in no distress, normal affect ENT -  no lesions, no post nasal drip, congested nasal turbinates Neck: No JVD, no thyromegaly, no carotid bruits Lungs: no use of accessory muscles, no dullness to percussion, clear without rales or rhonchi  Cardiovascular: Rhythm regular, heart sounds  normal, no murmurs or gallops, no peripheral edema Abdomen: soft and non-tender, no hepatosplenomegaly, BS normal. Musculoskeletal: No deformities, no cyanosis or clubbing Neuro:  alert, non focal       Assessment & Plan:

## 2016-09-26 NOTE — Progress Notes (Signed)
   Subjective:    Patient ID: Julia Gates, female    DOB: [birth year], 71 y.o.   MRN: [id number]  HPI    Review of Systems  Constitutional: Negative for fever and unexpected weight change.  HENT: Positive for congestion and dental problem. Negative for ear pain, nosebleeds, postnasal drip, rhinorrhea, sinus pressure, sneezing, sore throat and trouble swallowing.   Eyes: Negative for redness and itching.  Respiratory: Positive for chest tightness and shortness of breath. Negative for cough and wheezing.   Cardiovascular: Negative for palpitations and leg swelling.  Gastrointestinal: Negative for nausea and vomiting.  Genitourinary: Negative for dysuria.  Musculoskeletal: Positive for joint swelling.  Skin: Negative for rash.  Neurological: Negative for headaches.  Hematological: Does not bruise/bleed easily.  Psychiatric/Behavioral: Negative for dysphoric mood. The patient is nervous/anxious.        Objective:   Physical Exam        Assessment & Plan:

## 2016-09-26 NOTE — Patient Instructions (Addendum)
Breathing test appears ok  Chest x-ray today -we will call you with the results

## 2016-09-26 NOTE — Assessment & Plan Note (Signed)
No clear cause identified, spirometry is within normal and so obstructive lung disease in this never smoker. Exam does not suggest interstitial lung disease but will obtain chest x-ray for completion. Her dyspnea may simply be related to congestive nasal turbinates in her nocturnal symptoms related to heartburn. I offered her medication as a treatment trial to treat either/both of these conditions but she prefers herbal remedies.  I offered her nocturnal oximetry to check for nocturnal desaturations but she would like to defer at this point. I will schedule a follow-up in 3 months to see if she is worse and pursue this line of testing

## 2016-10-05 ENCOUNTER — Telehealth: Payer: Self-pay | Admitting: Pulmonary Disease

## 2016-10-05 NOTE — Telephone Encounter (Signed)
Notes recorded by Rigoberto Noel, MD on 09/27/2016 at 9:02 AM EDT Lungs clear   Spoke with patient. She is aware of results. Nothing else needed at time of call.

## 2016-10-13 DIAGNOSIS — H25813 Combined forms of age-related cataract, bilateral: Secondary | ICD-10-CM | POA: Diagnosis not present

## 2016-10-13 DIAGNOSIS — E119 Type 2 diabetes mellitus without complications: Secondary | ICD-10-CM | POA: Diagnosis not present

## 2016-12-27 ENCOUNTER — Encounter: Payer: Self-pay | Admitting: Adult Health

## 2016-12-27 ENCOUNTER — Ambulatory Visit (INDEPENDENT_AMBULATORY_CARE_PROVIDER_SITE_OTHER): Payer: Medicare Other | Admitting: Adult Health

## 2016-12-27 ENCOUNTER — Other Ambulatory Visit (INDEPENDENT_AMBULATORY_CARE_PROVIDER_SITE_OTHER): Payer: Medicare Other

## 2016-12-27 VITALS — BP 130/72 | HR 73 | Ht 66.0 in | Wt 158.4 lb

## 2016-12-27 DIAGNOSIS — J310 Chronic rhinitis: Secondary | ICD-10-CM | POA: Diagnosis not present

## 2016-12-27 DIAGNOSIS — R0600 Dyspnea, unspecified: Secondary | ICD-10-CM

## 2016-12-27 LAB — CBC WITH DIFFERENTIAL/PLATELET
BASOS ABS: 0 10*3/uL (ref 0.0–0.1)
Basophils Relative: 0.6 % (ref 0.0–3.0)
Eosinophils Absolute: 0.1 10*3/uL (ref 0.0–0.7)
Eosinophils Relative: 1.7 % (ref 0.0–5.0)
HEMATOCRIT: 42.1 % (ref 36.0–46.0)
Hemoglobin: 13.8 g/dL (ref 12.0–15.0)
LYMPHS PCT: 48.8 % — AB (ref 12.0–46.0)
Lymphs Abs: 2.1 10*3/uL (ref 0.7–4.0)
MCHC: 32.8 g/dL (ref 30.0–36.0)
MCV: 93.1 fl (ref 78.0–100.0)
MONOS PCT: 6.9 % (ref 3.0–12.0)
Monocytes Absolute: 0.3 10*3/uL (ref 0.1–1.0)
Neutro Abs: 1.8 10*3/uL (ref 1.4–7.7)
Neutrophils Relative %: 42 % — ABNORMAL LOW (ref 43.0–77.0)
Platelets: 195 10*3/uL (ref 150.0–400.0)
RBC: 4.52 Mil/uL (ref 3.87–5.11)
RDW: 14 % (ref 11.5–15.5)
WBC: 4.3 10*3/uL (ref 4.0–10.5)

## 2016-12-27 LAB — BASIC METABOLIC PANEL
BUN: 11 mg/dL (ref 6–23)
CHLORIDE: 103 meq/L (ref 96–112)
CO2: 30 meq/L (ref 19–32)
CREATININE: 0.99 mg/dL (ref 0.40–1.20)
Calcium: 9.7 mg/dL (ref 8.4–10.5)
GFR: 71.29 mL/min (ref >60.00)
Glucose, Bld: 100 mg/dL — ABNORMAL HIGH (ref 70–99)
Potassium: 3.8 mEq/L (ref 3.5–5.1)
Sodium: 139 mEq/L (ref 135–145)

## 2016-12-27 LAB — BRAIN NATRIURETIC PEPTIDE: Pro B Natriuretic peptide (BNP): 20 pg/mL (ref 0.0–100.0)

## 2016-12-27 NOTE — Assessment & Plan Note (Signed)
Dyspnea - nocturnal ? Etiology  Spirometry and walk test normal  Check ONO for nocturnal hypoxemia  Try saline nasal spray and nasal strip for nasal congestion - may be contributing to night symptoms   check labs with bnp and cbc   Plan  . Patient Instructions  Labs today .  Set up for overnight oximetry test .  Continue on current regimen  Begin Saline nasal spray Twice daily  .  Try nasal strip At bedtime  .  Follow up with Dr. Elsworth Soho  In 6-8 weeks and As needed

## 2016-12-27 NOTE — Assessment & Plan Note (Signed)
Add saline nasal spray  Nasal strips At bedtime .

## 2016-12-27 NOTE — Patient Instructions (Addendum)
Labs today .  Set up for overnight oximetry test .  Continue on current regimen  Begin Saline nasal spray Twice daily  .  Try nasal strip At bedtime  .  Follow up with Dr. Elsworth Soho  In 6-8 weeks and As needed

## 2016-12-27 NOTE — Progress Notes (Signed)
@Patient  ID: Julia Gates, female    DOB: 1945/04/20, 71 y.o.   MRN: [id number]  Chief Complaint  Patient presents with  . Follow-up    Dyspnea     Referring provider: Minette Brine, FNP  HPI: 71 yo female never smoker seen for pulmonary consult 09/26/16 for dyspnea for 5 years worse with lying down and dry cough .  Past medical history DM and hypertension.   TEST CXR 09/2016 lung clear Spirometry 09/2016 nml FEV1 90% and FVC 76% Walk test 12/27/2016 no desats, 96% walking on room air    12/27/2016  Follow up  : Dyspnea.  Patient returns for a three-month follow-up. Patient was seen last visit for a pulmonary consult for dyspnea for 5 years. She had associated dry cough. Symptoms were worse with lying flat, and tickle sensation in her throat. Reported a negative treadmill stress test by her cardiologist 2017. Chest x-ray in July showed clear lungs. Spirometry showed no airflow obstruction. She was recommended to have a overnight oximetry test. However, she declined at that time. She was recommended on treatment for sinus congestion, and heartburn. She declined treatment and preferred herbal remedies.. Since last visit she has not changed that much . Does not notice much dyspnea during daytime . Able to go to gym and exercise without difficulty . Has minimal dry cough .  Has minimal sinus congestion. GERD is not as bad now. Does not feel these are problems.  She is on multiple herbal products. She does not want to take rx if possible.    Denies chest pain, rash , hemoptysis , calf pain , edema , n/v., or fever. Appetite is good.  Some snoring and daytime sleepiness.   Allergies  Allergen Reactions  . Penicillins      There is no immunization history on file for this patient.  Past Medical History:  Diagnosis Date  . Abnormal glucose level   . Benign essential HTN   . High blood pressure   . High cholesterol   . Lumbago   . Osteoporosis   . Sciatica   . Vitamin D  deficiency     Tobacco History: History  Smoking Status  . Never Smoker  Smokeless Tobacco  . Never Used   Counseling given: Not Answered   Outpatient Encounter Prescriptions as of 12/27/2016  Medication Sig  . spironolactone (ALDACTONE) 25 MG tablet Take 25 mg by mouth daily.  . valsartan-hydrochlorothiazide (DIOVAN-HCT) 320-25 MG tablet Take 1 tablet by mouth daily.  . verapamil (VERELAN PM) 240 MG 24 hr capsule Take 240 mg by mouth at bedtime.   No facility-administered encounter medications on file as of 12/27/2016.      Review of Systems  Constitutional:   No  weight loss, night sweats,  Fevers, chills, fatigue, or  lassitude.  HEENT:   No headaches,  Difficulty swallowing,  Tooth/dental problems, or  Sore throat,                No sneezing, itching, ear ache, nasal congestion, post nasal drip,   CV:  No chest pain,  Orthopnea, PND, swelling in lower extremities, anasarca, dizziness, palpitations, syncope.   GI  No heartburn, indigestion, abdominal pain, nausea, vomiting, diarrhea, change in bowel habits, loss of appetite, bloody stools.   Resp: No shortness of breath with exertion or at rest.  No excess mucus, no productive cough,  No non-productive cough,  No coughing up of blood.  No change in color of mucus.  No wheezing.  No chest wall deformity  Skin: no rash or lesions.  GU: no dysuria, change in color of urine, no urgency or frequency.  No flank pain, no hematuria   MS:  No joint pain or swelling.  No decreased range of motion.  No back pain.    Physical Exam  BP 130/72 (BP Location: Left Arm, Cuff Size: Normal)   Pulse 73   Ht 5\' 6"  (1.676 m)   Wt 158 lb 6.4 oz (71.8 kg)   SpO2 100%   BMI 25.57 kg/m   GEN: A/Ox3; pleasant , NAD elderly    HEENT:  [place]/AT,  EACs-clear, TMs-wnl, NOSE- L>R turbinate edema , THROAT-clear, no lesions, no postnasal drip or exudate noted.   NECK:  Supple w/ fair ROM; no JVD; normal carotid impulses w/o bruits; no  thyromegaly or nodules palpated; no lymphadenopathy.    RESP  Clear  P & A; w/o, wheezes/ rales/ or rhonchi. no accessory muscle use, no dullness to percussion  CARD:  RRR, no m/r/g, no peripheral edema, pulses intact, no cyanosis or clubbing.  GI:   Soft & nt; nml bowel sounds; no organomegaly or masses detected.   Musco: Warm bil, no deformities or joint swelling noted.   Neuro: alert, no focal deficits noted.    Skin: Warm, no lesions or rashes    Lab Results:  CBC  BNP No results found for: BNP  ProBNP No results found for: PROBNP  Imaging: No results found.   Assessment & Plan:   Dyspnea Dyspnea - nocturnal ? Etiology  Spirometry and walk test normal  Check ONO for nocturnal hypoxemia  Try saline nasal spray and nasal strip for nasal congestion - may be contributing to night symptoms   check labs with bnp and cbc   Plan  . Patient Instructions  Labs today .  Set up for overnight oximetry test .  Continue on current regimen  Begin Saline nasal spray Twice daily  .  Try nasal strip At bedtime  .  Follow up with Dr. Elsworth Soho  In 6-8 weeks and As needed      Chronic rhinitis Add saline nasal spray  Nasal strips At bedtime .      Rexene Edison, NP 12/27/2016

## 2016-12-28 DIAGNOSIS — R7309 Other abnormal glucose: Secondary | ICD-10-CM | POA: Diagnosis not present

## 2016-12-28 DIAGNOSIS — Z124 Encounter for screening for malignant neoplasm of cervix: Secondary | ICD-10-CM | POA: Diagnosis not present

## 2016-12-28 DIAGNOSIS — Z0000 Encounter for general adult medical examination without abnormal findings: Secondary | ICD-10-CM | POA: Diagnosis not present

## 2016-12-28 DIAGNOSIS — Z13820 Encounter for screening for osteoporosis: Secondary | ICD-10-CM | POA: Diagnosis not present

## 2016-12-28 DIAGNOSIS — Z01419 Encounter for gynecological examination (general) (routine) without abnormal findings: Secondary | ICD-10-CM | POA: Diagnosis not present

## 2016-12-28 DIAGNOSIS — N281 Cyst of kidney, acquired: Secondary | ICD-10-CM | POA: Diagnosis not present

## 2016-12-28 DIAGNOSIS — E559 Vitamin D deficiency, unspecified: Secondary | ICD-10-CM | POA: Diagnosis not present

## 2016-12-28 DIAGNOSIS — I10 Essential (primary) hypertension: Secondary | ICD-10-CM | POA: Diagnosis not present

## 2016-12-28 DIAGNOSIS — Z1239 Encounter for other screening for malignant neoplasm of breast: Secondary | ICD-10-CM | POA: Diagnosis not present

## 2016-12-28 DIAGNOSIS — E2839 Other primary ovarian failure: Secondary | ICD-10-CM | POA: Diagnosis not present

## 2016-12-29 NOTE — Progress Notes (Signed)
Reviewed & agree with plan  

## 2017-01-03 DIAGNOSIS — R0902 Hypoxemia: Secondary | ICD-10-CM | POA: Diagnosis not present

## 2017-01-03 DIAGNOSIS — J449 Chronic obstructive pulmonary disease, unspecified: Secondary | ICD-10-CM | POA: Diagnosis not present

## 2017-01-06 ENCOUNTER — Other Ambulatory Visit: Payer: Self-pay | Admitting: Nurse Practitioner

## 2017-01-06 DIAGNOSIS — Z1231 Encounter for screening mammogram for malignant neoplasm of breast: Secondary | ICD-10-CM

## 2017-01-06 DIAGNOSIS — E2839 Other primary ovarian failure: Secondary | ICD-10-CM

## 2017-02-07 ENCOUNTER — Ambulatory Visit (INDEPENDENT_AMBULATORY_CARE_PROVIDER_SITE_OTHER): Payer: Medicare Other | Admitting: Pulmonary Disease

## 2017-02-07 ENCOUNTER — Encounter: Payer: Self-pay | Admitting: Pulmonary Disease

## 2017-02-07 DIAGNOSIS — R0600 Dyspnea, unspecified: Secondary | ICD-10-CM | POA: Diagnosis not present

## 2017-02-07 DIAGNOSIS — J310 Chronic rhinitis: Secondary | ICD-10-CM

## 2017-02-07 NOTE — Patient Instructions (Signed)
Sample of dymista nasal spray each nare at bedtime  Call us as needed if you have breathing issues

## 2017-02-07 NOTE — Assessment & Plan Note (Signed)
No clear cause identified. May be related to rhinitis issues Call us as needed if you have breathing issues

## 2017-02-07 NOTE — Progress Notes (Signed)
   Subjective:    Patient ID: Julia Gates, female    DOB: January 28, 1946, 71 y.o.   MRN: [id number]  HPI  71 year old never smoker for FU of increased shortness of breath especially when lying down during the summer months.  She is a diabetic, hypertensive and is on an ARB.  She reports a negative treadmill stress test by her cardiologist . She also reports nocturnal oximetry which showed drop in her oxygen levels but is not interested in pursuing this test at this time  Breathing is good most of the time but occasionally she feels dyspneic especially on climbing stairs.She  sleeps with 2-3 pillows & has her bed elevated. She did not desaturate on exertion today  Significant tests/ events reviewed   09/2016 Spirometry -  poor effort,  FEV1 90% and FVC 76% probably related to effort, no evidence of obstruction  Review of Systems neg for any significant sore throat, dysphagia, itching, sneezing, nasal congestion or excess/ purulent secretions, fever, chills, sweats, unintended wt loss, pleuritic or exertional cp, hempoptysis, orthopnea pnd or change in chronic leg swelling. Also denies presyncope, palpitations, heartburn, abdominal pain, nausea, vomiting, diarrhea or change in bowel or urinary habits, dysuria,hematuria, rash, arthralgias, visual complaints, headache, numbness weakness or ataxia.     Objective:   Physical Exam  Gen. Pleasant, well-nourished, in no distress ENT -swollen nasal turbinates, no post nasal drip Neck: No JVD, no thyromegaly, no carotid bruits Lungs: no use of accessory muscles, no dullness to percussion, clear without rales or rhonchi  Cardiovascular: Rhythm regular, heart sounds  normal, no murmurs or gallops, no peripheral edema Musculoskeletal: No deformities, no cyanosis or clubbing         Assessment & Plan:

## 2017-02-07 NOTE — Assessment & Plan Note (Signed)
Sample of dymista nasal spray each nare at bedtime

## 2017-02-08 ENCOUNTER — Ambulatory Visit
Admission: RE | Admit: 2017-02-08 | Discharge: 2017-02-08 | Disposition: A | Discharge status: Home / Self Care | Payer: Medicare Other | Source: Ambulatory Visit | Attending: Nurse Practitioner | Admitting: Nurse Practitioner

## 2017-02-08 DIAGNOSIS — Z780 Asymptomatic menopausal state: Secondary | ICD-10-CM | POA: Diagnosis not present

## 2017-02-08 DIAGNOSIS — Z1231 Encounter for screening mammogram for malignant neoplasm of breast: Secondary | ICD-10-CM | POA: Diagnosis not present

## 2017-02-08 DIAGNOSIS — M8589 Other specified disorders of bone density and structure, multiple sites: Secondary | ICD-10-CM | POA: Diagnosis not present

## 2017-02-08 DIAGNOSIS — E2839 Other primary ovarian failure: Secondary | ICD-10-CM

## 2017-06-28 LAB — BASIC METABOLIC PANEL
BUN: 12 (ref 4–21)
CREATININE: 0.9 (ref 0.5–1.1)
GLUCOSE: 89
POTASSIUM: 3.5 (ref 3.4–5.3)
SODIUM: 141 (ref 137–147)

## 2017-06-28 LAB — VITAMIN B12: VITAMIN B 12: 438

## 2017-06-28 LAB — LIPID PANEL
Cholesterol: 192 (ref 0–200)
HDL: 57 (ref 35–70)
LDL Cholesterol: 122
LDl/HDL Ratio: 2.1
Triglycerides: 65 (ref 40–160)

## 2017-06-28 LAB — HEMOGLOBIN A1C: HEMOGLOBIN A1C: 6.1

## 2017-06-28 LAB — HEPATIC FUNCTION PANEL
ALT: 15 (ref 7–35)
AST: 22 (ref 13–35)
Alkaline Phosphatase: 66 (ref 25–125)

## 2017-06-28 LAB — VITAMIN D 25 HYDROXY (VIT D DEFICIENCY, FRACTURES): Vit D, 25-Hydroxy: 32

## 2017-06-28 LAB — TSH: TSH: 2.33 (ref 0.41–5.90)

## 2017-12-09 ENCOUNTER — Encounter: Payer: Self-pay | Admitting: Internal Medicine

## 2017-12-09 DIAGNOSIS — I10 Essential (primary) hypertension: Secondary | ICD-10-CM

## 2017-12-28 ENCOUNTER — Ambulatory Visit (INDEPENDENT_AMBULATORY_CARE_PROVIDER_SITE_OTHER): Payer: Medicare Other | Admitting: Internal Medicine

## 2017-12-28 ENCOUNTER — Encounter: Payer: Self-pay | Admitting: Internal Medicine

## 2017-12-28 ENCOUNTER — Ambulatory Visit (INDEPENDENT_AMBULATORY_CARE_PROVIDER_SITE_OTHER): Payer: Medicare Other

## 2017-12-28 VITALS — BP 148/88 | HR 83 | Temp 98.0°F | Ht 64.0 in | Wt 163.8 lb

## 2017-12-28 VITALS — BP 146/92 | HR 83 | Temp 98.0°F | Ht 64.0 in | Wt 163.8 lb

## 2017-12-28 DIAGNOSIS — R0609 Other forms of dyspnea: Secondary | ICD-10-CM

## 2017-12-28 DIAGNOSIS — E1165 Type 2 diabetes mellitus with hyperglycemia: Secondary | ICD-10-CM

## 2017-12-28 DIAGNOSIS — I4510 Unspecified right bundle-branch block: Secondary | ICD-10-CM | POA: Diagnosis not present

## 2017-12-28 DIAGNOSIS — I10 Essential (primary) hypertension: Secondary | ICD-10-CM | POA: Diagnosis not present

## 2017-12-28 DIAGNOSIS — Z0000 Encounter for general adult medical examination without abnormal findings: Secondary | ICD-10-CM | POA: Diagnosis not present

## 2017-12-28 LAB — POCT URINALYSIS DIPSTICK
Bilirubin, UA: NEGATIVE
Blood, UA: NEGATIVE
Glucose, UA: NEGATIVE
Ketones, UA: NEGATIVE
LEUKOCYTES UA: NEGATIVE
Nitrite, UA: NEGATIVE
Protein, UA: NEGATIVE
SPEC GRAV UA: 1.015 (ref 1.010–1.025)
Urobilinogen, UA: 0.2 E.U./dL
pH, UA: 7.5 (ref 5.0–8.0)

## 2017-12-28 LAB — POCT UA - MICROALBUMIN
Creatinine, POC: 50 mg/dL
MICROALBUMIN (UR) POC: 10 mg/L

## 2017-12-28 MED ORDER — VERAPAMIL HCL ER 240 MG PO CP24: 240.0000 mg | ORAL_CAPSULE | Freq: Every day | ORAL | 0 refills | Status: DC

## 2017-12-28 MED ORDER — SPIRONOLACTONE 25 MG PO TABS: 25.0000 mg | ORAL_TABLET | Freq: Every day | ORAL | 0 refills | Status: DC

## 2017-12-28 NOTE — Patient Instructions (Signed)
Julia Gates , Thank you for taking time to come for your Medicare Wellness Visit. I appreciate your ongoing commitment to your health goals. Please review the following plan we discussed and let me know if I can assist you in the future.   Screening recommendations/referrals: Colonoscopy: up to date Mammogram: up to date Bone Density: up to date Recommended yearly ophthalmology/optometry visit for glaucoma screening and checkup Recommended yearly dental visit for hygiene and checkup  Vaccinations: Influenza vaccine: decline Pneumococcal vaccine: decline Tdap vaccine: up to date Shingles vaccine: decline    Advanced directives: Advance directive discussed with you today. I have provided a copy for you to complete at home and have notarized. Once this is complete please bring a copy in to our office so we can scan it into your chart.   Conditions/risks identified: Overweight: Patient wants to start exercising regularly.  Next appointment: 12/28/2012   Preventive Care 71 Years and Older, Female Preventive care refers to lifestyle choices and visits with your health care provider that can promote health and wellness. What does preventive care include?  A yearly physical exam. This is also called an annual well check.  Dental exams once or twice a year.  Routine eye exams. Ask your health care provider how often you should have your eyes checked.  Personal lifestyle choices, including:  Daily care of your teeth and gums.  Regular physical activity.  Eating a healthy diet.  Avoiding tobacco and drug use.  Limiting alcohol use.  Practicing safe sex.  Taking low-dose aspirin every day.  Taking vitamin and mineral supplements as recommended by your health care provider. What happens during an annual well check? The services and screenings done by your health care provider during your annual well check will depend on your age, overall health, lifestyle risk factors, and  family history of disease. Counseling  Your health care provider may ask you questions about your:  Alcohol use.  Tobacco use.  Drug use.  Emotional well-being.  Home and relationship well-being.  Sexual activity.  Eating habits.  History of falls.  Memory and ability to understand (cognition).  Work and work Statistician.  Reproductive health. Screening  You may have the following tests or measurements:  Height, weight, and BMI.  Blood pressure.  Lipid and cholesterol levels. These may be checked every 5 years, or more frequently if you are over 37 years old.  Skin check.  Lung cancer screening. You may have this screening every year starting at age 71 if you have a 30-pack-year history of smoking and currently smoke or have quit within the past 15 years.  Fecal occult blood test (FOBT) of the stool. You may have this test every year starting at age 71.  Flexible sigmoidoscopy or colonoscopy. You may have a sigmoidoscopy every 5 years or a colonoscopy every 10 years starting at age 71.  Hepatitis C blood test.  Hepatitis B blood test.  Sexually transmitted disease (STD) testing.  Diabetes screening. This is done by checking your blood sugar (glucose) after you have not eaten for a while (fasting). You may have this done every 1-3 years.  Bone density scan. This is done to screen for osteoporosis. You may have this done starting at age 71.  Mammogram. This may be done every 1-2 years. Talk to your health care provider about how often you should have regular mammograms. Talk with your health care provider about your test results, treatment options, and if necessary, the need for more tests. Vaccines  Your health care provider may recommend certain vaccines, such as:  Influenza vaccine. This is recommended every year.  Tetanus, diphtheria, and acellular pertussis (Tdap, Td) vaccine. You may need a Td booster every 10 years.  Zoster vaccine. You may need this  after age 73.  Pneumococcal 13-valent conjugate (PCV13) vaccine. One dose is recommended after age 71.  Pneumococcal polysaccharide (PPSV23) vaccine. One dose is recommended after age 71. Talk to your health care provider about which screenings and vaccines you need and how often you need them. This information is not intended to replace advice given to you by your health care provider. Make sure you discuss any questions you have with your health care provider. Document Released: 03/20/2015 Document Revised: 11/11/2015 Document Reviewed: 12/23/2014 Elsevier Interactive Patient Education  2017 [place] Prevention in the Home Falls can cause injuries. They can happen to people of all ages. There are many things you can do to make your home safe and to help prevent falls. What can I do on the outside of my home?  Regularly fix the edges of walkways and driveways and fix any cracks.  Remove anything that might make you trip as you walk through a door, such as a raised step or threshold.  Trim any bushes or trees on the path to your home.  Use bright outdoor lighting.  Clear any walking paths of anything that might make someone trip, such as rocks or tools.  Regularly check to see if handrails are loose or broken. Make sure that both sides of any steps have handrails.  Any raised decks and porches should have guardrails on the edges.  Have any leaves, snow, or ice cleared regularly.  Use sand or salt on walking paths during winter.  Clean up any spills in your garage right away. This includes oil or grease spills. What can I do in the bathroom?  Use night lights.  Install grab bars by the toilet and in the tub and shower. Do not use towel bars as grab bars.  Use non-skid mats or decals in the tub or shower.  If you need to sit down in the shower, use a plastic, non-slip stool.  Keep the floor dry. Clean up any water that spills on the floor as soon as it  happens.  Remove soap buildup in the tub or shower regularly.  Attach bath mats securely with double-sided non-slip rug tape.  Do not have throw rugs and other things on the floor that can make you trip. What can I do in the bedroom?  Use night lights.  Make sure that you have a light by your bed that is easy to reach.  Do not use any sheets or blankets that are too big for your bed. They should not hang down onto the floor.  Have a firm chair that has side arms. You can use this for support while you get dressed.  Do not have throw rugs and other things on the floor that can make you trip. What can I do in the kitchen?  Clean up any spills right away.  Avoid walking on wet floors.  Keep items that you use a lot in easy-to-reach places.  If you need to reach something above you, use a strong step stool that has a grab bar.  Keep electrical cords out of the way.  Do not use floor polish or wax that makes floors slippery. If you must use wax, use non-skid floor wax.  Do  not have throw rugs and other things on the floor that can make you trip. What can I do with my stairs?  Do not leave any items on the stairs.  Make sure that there are handrails on both sides of the stairs and use them. Fix handrails that are broken or loose. Make sure that handrails are as long as the stairways.  Check any carpeting to make sure that it is firmly attached to the stairs. Fix any carpet that is loose or worn.  Avoid having throw rugs at the top or bottom of the stairs. If you do have throw rugs, attach them to the floor with carpet tape.  Make sure that you have a light switch at the top of the stairs and the bottom of the stairs. If you do not have them, ask someone to add them for you. What else can I do to help prevent falls?  Wear shoes that:  Do not have high heels.  Have rubber bottoms.  Are comfortable and fit you well.  Are closed at the toe. Do not wear sandals.  If you  use a stepladder:  Make sure that it is fully opened. Do not climb a closed stepladder.  Make sure that both sides of the stepladder are locked into place.  Ask someone to hold it for you, if possible.  Clearly mark and make sure that you can see:  Any grab bars or handrails.  First and last steps.  Where the edge of each step is.  Use tools that help you move around (mobility aids) if they are needed. These include:  Canes.  Walkers.  Scooters.  Crutches.  Turn on the lights when you go into a dark area. Replace any light bulbs as soon as they burn out.  Set up your furniture so you have a clear path. Avoid moving your furniture around.  If any of your floors are uneven, fix them.  If there are any pets around you, be aware of where they are.  Review your medicines with your doctor. Some medicines can make you feel dizzy. This can increase your chance of falling. Ask your doctor what other things that you can do to help prevent falls. This information is not intended to replace advice given to you by your health care provider. Make sure you discuss any questions you have with your health care provider. Document Released: 12/18/2008 Document Revised: 07/30/2015 Document Reviewed: 03/28/2014 Elsevier Interactive Patient Education  2017 [employer].

## 2017-12-28 NOTE — Progress Notes (Signed)
Subjective:     Patient ID: Julia Gates , female    DOB: 1946/02/12 , 72 y.o.   MRN: [id number]   HPI   Pt is here for DM and HTN FU  1-She does not check her glucose since she cant work the glucometer. She was educated how to do it, but has not practiced enough to do it on her own. Denies having any episodes of hypoglycemia.   2- her Bps run in the 120's. She forgot to take it yesterday, so took it last night. She did not take it this am. Has seen her cardiologist this year, and he has released her to come back prn, so she needs refill of her medication.    Past Medical History:  Diagnosis Date  . Abnormal glucose level   . Benign essential HTN   . High blood pressure   . High cholesterol   . Lumbago   . Osteoporosis   . Sciatica   . Vitamin D deficiency       Current Outpatient Medications:  .  spironolactone (ALDACTONE) 25 MG tablet, Take 25 mg by mouth daily., Disp: , Rfl:  .  traMADol (ULTRAM) 50 MG tablet, Take by mouth every 6 (six) hours as needed., Disp: , Rfl:  .  valsartan-hydrochlorothiazide (DIOVAN-HCT) 320-25 MG tablet, Take 1 tablet by mouth daily., Disp: , Rfl:  .  verapamil (VERELAN PM) 240 MG 24 hr capsule, Take 240 mg by mouth at bedtime., Disp: , Rfl:  .  Zoster Vaccine Live, PF, (ZOSTAVAX) 52841 UNT/0.65ML injection, Inject 0.65 mLs into the skin once., Disp: , Rfl:    Allergies  Allergen Reactions  . Penicillins      Review of Systems  Constitutional: Negative for chills, diaphoresis and fever.  HENT: Negative for tinnitus.   Respiratory: Positive for shortness of breath. Negative for chest tightness.        Mild SOB with climbing 15 steps at home which she does several times a day  Cardiovascular: Positive for chest pain. Negative for palpitations and leg swelling.       Gets R chest wall tenderness lasting a few seconds  Gastrointestinal: Negative for abdominal distention and nausea.  Endocrine: Negative for polydipsia and polyphagia.   Genitourinary: Negative for dysuria and frequency.  Musculoskeletal: Negative for gait problem.  Skin: Negative for color change, rash and wound.  Neurological: Negative for dizziness and headaches.     Today's Vitals   12/28/17 1020  BP: (!) 146/92  Pulse: 83  Temp: 98 F (36.7 C)  TempSrc: Oral  Weight: 163 lb 12.8 oz (74.3 kg)  Height: 5\' 4"  (1.626 m)   Body mass index is 28.12 kg/m.   Objective:  Physical Exam  Constitutional: She is oriented to person, place, and time. She appears well-developed and well-nourished.  HENT:  Head: Normocephalic.  Right Ear: External ear normal.  Left Ear: External ear normal.  Nose: Nose normal.  Eyes: Conjunctivae and EOM are normal. Right eye exhibits no discharge. Left eye exhibits no discharge. No scleral icterus.  Neck: Neck supple. No thyromegaly present.  No carotid bruits  Cardiovascular: Normal rate, regular rhythm and intact distal pulses.  No murmur heard. Pulmonary/Chest: Effort normal and breath sounds normal. No stridor. No respiratory distress. She has no wheezes. She has no rales. She exhibits no tenderness.  Musculoskeletal: She exhibits no edema or deformity.  Lymphadenopathy:    She has no cervical adenopathy.  Neurological: She is alert and oriented to person, place,  and time.  Skin: Skin is warm and dry. Capillary refill takes less than 2 seconds. No rash noted.  Psychiatric: She has a normal mood and affect. Her behavior is normal. Judgment and thought content normal.  Nursing note and vitals reviewed.  EKG- unchanged from 2017. I also reviewed her last Myoview from 06/2015  Assessment And Plan:     1. Uncontrolled type 2 diabetes mellitus with hyperglycemia (HCC)- chronic, HGBA1C ordered FU 3 months  2- SOB with exertion- chronic, EKG unchanged. Has seen her cardiologist this year and she was cleared to return prn   3- RBBB- stable. Will continue Fu with cardiologist prn.  4- HTN- not well controlled  today. CBC and CMP ordered. Asked to call us with BP dietaries x 3 in the next week.          Joslynne Klatt RODRIGUEZ-SOUTHWORTH, PA-C

## 2017-12-28 NOTE — Patient Instructions (Addendum)
Please take your blood pressure medications every day, if it helps you to remember you can leave the bottle by your tooth brush if you brush them every morning. Call us with your BP reading 2-3 hours after you take your medication. ( three of them).  Ask the pharmacist or make a nurse visit here so we can re-view how to use the glucometer.

## 2017-12-28 NOTE — Addendum Note (Signed)
Addended by: Kellie Simmering on: 12/28/2017 02:34 PM   Modules accepted: Orders

## 2017-12-28 NOTE — Progress Notes (Signed)
Subjective:   Julia Gates is a 72 y.o. female who presents for Medicare Annual (Subsequent) preventive examination.  Review of Systems:  n/a Cardiac Risk Factors include: advanced age (>53men, >91 women);diabetes mellitus;hypertension;sedentary lifestyle     Objective:     Vitals: BP (!) 146/92 (BP Location: Left Arm)   Pulse 83   Temp 98 F (36.7 C)   Ht 5\' 4"  (1.626 m)   Wt 163 lb 12.8 oz (74.3 kg)   SpO2 97%   BMI 28.12 kg/m   Body mass index is 28.12 kg/m.  Advanced Directives 12/28/2017  Does Patient Have a Medical Advance Directive? No  Would patient like information on creating a medical advance directive? Yes (MAU/Ambulatory/Procedural Areas - Information given)    Tobacco Social History   Tobacco Use  Smoking Status Never Smoker  Smokeless Tobacco Never Used     Counseling given: Not Answered   Clinical Intake:  Pre-visit preparation completed: Yes  Pain : No/denies pain     Nutritional Status: BMI 25 -29 Overweight Nutritional Risks: None Diabetes: Yes CBG done?: No Did pt. bring in CBG monitor from home?: No  How often do you need to have someone help you when you read instructions, pamphlets, or other written materials from your doctor or pharmacy?: 1 - Never What is the last grade level you completed in school?: 12 th grade  Interpreter Needed?: No  Information entered by :: NAllen LPN  Past Medical History:  Diagnosis Date  . Abnormal glucose level   . Benign essential HTN   . High blood pressure   . High cholesterol   . Lumbago   . Osteoporosis   . Sciatica   . Vitamin D deficiency    History reviewed. No pertinent surgical history. Family History  Problem Relation Age of Onset  . Heart disease Mother   . Diabetes Mother   . Diabetes Brother   . Diabetes Sister    Social History   Socioeconomic History  . Marital status: Married    Spouse name: Not on file  . Number of children: 3  . Years of education: Not on  file  . Highest education level: Not on file  Occupational History  . Occupation: retired  Scientific laboratory technician  . Financial resource strain: Not hard at all  . Food insecurity:    Worry: Never true    Inability: Never true  . Transportation needs:    Medical: No    Non-medical: No  Tobacco Use  . Smoking status: Never Smoker  . Smokeless tobacco: Never Used  Substance and Sexual Activity  . Alcohol use: No    Comment: drinks tea occ  . Drug use: No  . Sexual activity: Yes  Lifestyle  . Physical activity:    Days per week: 0 days    Minutes per session: 0 min  . Stress: Not at all  Relationships  . Social connections:    Talks on phone: Not on file    Gets together: Not on file    Attends religious service: Not on file    Active member of club or organization: Not on file    Attends meetings of clubs or organizations: Not on file    Relationship status: Not on file  Other Topics Concern  . Not on file  Social History Narrative   Patient lives at home with her husband and she is retired. She has a high school education and 3 children    Outpatient Encounter  Medications as of 12/28/2017  Medication Sig  . traMADol (ULTRAM) 50 MG tablet Take by mouth every 6 (six) hours as needed.  . [DISCONTINUED] spironolactone (ALDACTONE) 25 MG tablet Take 25 mg by mouth daily.  . [DISCONTINUED] valsartan-hydrochlorothiazide (DIOVAN-HCT) 320-25 MG tablet Take 1 tablet by mouth daily.  Marland Kitchen Zoster Vaccine Live, PF, (ZOSTAVAX) 32202 UNT/0.65ML injection Inject 0.65 mLs into the skin once.  . [DISCONTINUED] verapamil (VERELAN PM) 240 MG 24 hr capsule Take 240 mg by mouth at bedtime.   No facility-administered encounter medications on file as of 12/28/2017.     Activities of Daily Living In your present state of health, do you have any difficulty performing the following activities: 12/28/2017  Hearing? N  Vision? Y  Comment cataracts  Difficulty concentrating or making decisions? Y  Comment  forgets to bring objects, forgets what she went to rooms  for  Walking or climbing stairs? N  Dressing or bathing? N  Doing errands, shopping? N  Preparing Food and eating ? N  Using the Toilet? N  In the past six months, have you accidently leaked urine? Y  Comment if does not go to the bathroom soon enough  Do you have problems with loss of bowel control? N  Managing your Medications? N  Managing your Finances? N  Housekeeping or managing your Housekeeping? N  Some recent data might be hidden    Patient Care Team: Minette Brine, FNP as PCP - General (General Practice)    Assessment:   This is a routine wellness examination for [place].  Exercise Activities and Dietary recommendations Current Exercise Habits: The patient does not participate in regular exercise at present, Exercise limited by: None identified  Goals    . Exercise 150 min/wk Moderate Activity (pt-stated)       Fall Risk Fall Risk  12/28/2017  Falls in the past year? No  Risk for fall due to : Medication side effect   Is the patient's home free of loose throw rugs in walkways, pet beds, electrical cords, etc?   yes      Grab bars in the bathroom? no      Handrails on the stairs?   yes      Adequate lighting?   yes  Timed Get Up and Go performed: n/a  Depression Screen PHQ 2/9 Scores 12/28/2017  PHQ - 2 Score 0  PHQ- 9 Score 1     Cognitive Function     6CIT Screen 12/28/2017  What Year? 0 points  What month? 0 points  What time? 0 points  Count back from 20 0 points  Months in reverse 0 points  Repeat phrase 0 points  Total Score 0     There is no immunization history on file for this patient.  Qualifies for Shingles Vaccine? Up to dateyes  Screening Tests Health Maintenance  Topic Date Due  . Hepatitis C Screening  June 15, 1945  . COLONOSCOPY  11/14/1996  . INFLUENZA VACCINE  12/29/2018 (Originally 10/05/2017)  . TETANUS/TDAP  12/29/2018 (Originally 11/14/1965)  . PNA vac Low Risk  Adult (1 of 2 - PCV13) 12/29/2018 (Originally 11/15/2011)  . MAMMOGRAM  02/09/2019  . DEXA SCAN  Completed    Cancer Screenings: Lung: Low Dose CT Chest recommended if Age 16-80 years, 30 pack-year currently smoking OR have quit w/in 15years. Patient does not qualify. Breast:  Up to date on Mammogram? Yes   Up to date of Bone Density/Dexa? Yes Colorectal: up to date  Additional Screenings: :  Hepatitis C Screening: n/a     Plan:   Patient would like to increase exercise. States she does not receive vaccinations.  I have personally reviewed and noted the following in the patient's chart:   . Medical and social history . Use of alcohol, tobacco or illicit drugs  . Current medications and supplements . Functional ability and status . Nutritional status . Physical activity . Advanced directives . List of other physicians . Hospitalizations, surgeries, and ER visits in previous 12 months . Vitals . Screenings to include cognitive, depression, and falls . Referrals and appointments  In addition, I have reviewed and discussed with patient certain preventive protocols, quality metrics, and best practice recommendations. A written personalized care plan for preventive services as well as general preventive health recommendations were provided to patient.     Kellie Simmering, LPN  03/[id number]

## 2017-12-29 LAB — CMP14 + ANION GAP
A/G RATIO: 1.8 (ref 1.2–2.2)
ALK PHOS: 59 IU/L (ref 39–117)
ALT: 20 IU/L (ref 0–32)
AST: 21 IU/L (ref 0–40)
Albumin: 4.5 g/dL (ref 3.5–4.8)
Anion Gap: 19 mmol/L — ABNORMAL HIGH (ref 10.0–18.0)
BILIRUBIN TOTAL: 0.5 mg/dL (ref 0.0–1.2)
BUN/Creatinine Ratio: 14 (ref 12–28)
BUN: 13 mg/dL (ref 8–27)
CHLORIDE: 108 mmol/L — AB (ref 96–106)
CO2: 16 mmol/L — AB (ref 20–29)
Calcium: 9.5 mg/dL (ref 8.7–10.3)
Creatinine, Ser: 0.94 mg/dL (ref 0.57–1.00)
GFR calc non Af Amer: 61 mL/min/{1.73_m2} (ref >59)
GFR, EST AFRICAN AMERICAN: 71 mL/min/{1.73_m2} (ref >59)
GLUCOSE: 85 mg/dL (ref 65–99)
Globulin, Total: 2.5 g/dL (ref 1.5–4.5)
POTASSIUM: 4.1 mmol/L (ref 3.5–5.2)
Sodium: 143 mmol/L (ref 134–144)
TOTAL PROTEIN: 7 g/dL (ref 6.0–8.5)

## 2017-12-29 LAB — LIPID PANEL
CHOLESTEROL TOTAL: 183 mg/dL (ref 100–199)
Chol/HDL Ratio: 3.2 ratio (ref 0.0–4.4)
HDL: 57 mg/dL (ref >39)
LDL Calculated: 111 mg/dL — ABNORMAL HIGH (ref 0–99)
TRIGLYCERIDES: 74 mg/dL (ref 0–149)
VLDL CHOLESTEROL CAL: 15 mg/dL (ref 5–40)

## 2017-12-29 LAB — CBC
HEMATOCRIT: 39.7 % (ref 34.0–46.6)
Hemoglobin: 13.3 g/dL (ref 11.1–15.9)
MCH: 30.5 pg (ref 26.6–33.0)
MCHC: 33.5 g/dL (ref 31.5–35.7)
MCV: 91 fL (ref 79–97)
PLATELETS: 192 10*3/uL (ref 150–450)
RBC: 4.36 x10E6/uL (ref 3.77–5.28)
RDW: 12.3 % (ref 12.3–15.4)
WBC: 4.5 10*3/uL (ref 3.4–10.8)

## 2017-12-29 LAB — HEMOGLOBIN A1C
Est. average glucose Bld gHb Est-mCnc: 128 mg/dL
Hgb A1c MFr Bld: 6.1 % — ABNORMAL HIGH (ref 4.8–5.6)

## 2018-04-04 ENCOUNTER — Encounter: Payer: Self-pay | Admitting: Nurse Practitioner

## 2018-04-04 ENCOUNTER — Ambulatory Visit (INDEPENDENT_AMBULATORY_CARE_PROVIDER_SITE_OTHER): Payer: Medicare Other | Admitting: Nurse Practitioner

## 2018-04-04 VITALS — BP 140/92 | HR 76 | Temp 97.8°F | Ht 63.6 in | Wt 158.0 lb

## 2018-04-04 DIAGNOSIS — I10 Essential (primary) hypertension: Secondary | ICD-10-CM | POA: Diagnosis not present

## 2018-04-04 DIAGNOSIS — R002 Palpitations: Secondary | ICD-10-CM

## 2018-04-04 DIAGNOSIS — R7303 Prediabetes: Secondary | ICD-10-CM | POA: Diagnosis not present

## 2018-04-04 DIAGNOSIS — M85851 Other specified disorders of bone density and structure, right thigh: Secondary | ICD-10-CM | POA: Insufficient documentation

## 2018-04-04 LAB — CMP14 + ANION GAP
A/G RATIO: 1.8 (ref 1.2–2.2)
ALBUMIN: 4.4 g/dL (ref 3.7–4.7)
ALK PHOS: 57 IU/L (ref 39–117)
ALT: 15 IU/L (ref 0–32)
AST: 20 IU/L (ref 0–40)
Anion Gap: 16 mmol/L (ref 10.0–18.0)
BILIRUBIN TOTAL: 1 mg/dL (ref 0.0–1.2)
BUN/Creatinine Ratio: 21 (ref 12–28)
BUN: 19 mg/dL (ref 8–27)
CHLORIDE: 101 mmol/L (ref 96–106)
CO2: 22 mmol/L (ref 20–29)
Calcium: 9.8 mg/dL (ref 8.7–10.3)
Creatinine, Ser: 0.9 mg/dL (ref 0.57–1.00)
GFR calc Af Amer: 74 mL/min/{1.73_m2} (ref >59)
GFR calc non Af Amer: 65 mL/min/{1.73_m2} (ref >59)
GLUCOSE: 96 mg/dL (ref 65–99)
Globulin, Total: 2.5 g/dL (ref 1.5–4.5)
POTASSIUM: 4.3 mmol/L (ref 3.5–5.2)
Sodium: 139 mmol/L (ref 134–144)
Total Protein: 6.9 g/dL (ref 6.0–8.5)

## 2018-04-04 LAB — HEMOGLOBIN A1C
Est. average glucose Bld gHb Est-mCnc: 131 mg/dL
HEMOGLOBIN A1C: 6.2 % — AB (ref 4.8–5.6)

## 2018-04-04 NOTE — Progress Notes (Addendum)
Subjective:     Patient ID: Julia Gates , female    DOB: 01/26/1946 , 73 y.o.   MRN: [id number]   Chief Complaint  Patient presents with  . Diabetes    HPI  Hypertension  This is a chronic problem. The current episode started more than 1 year ago. The problem is unchanged. The problem is controlled. Pertinent negatives include no anxiety, chest pain, headaches, palpitations or shortness of breath. There are no associated agents to hypertension. There are no known risk factors for coronary artery disease. Past treatments include ACE inhibitors and diuretics. The current treatment provides significant improvement. There are no compliance problems.  There is no history of angina. There is no history of chronic renal disease.  Palpitations   This is a new problem. The problem occurs intermittently. Progression since onset: occurring more frequently. Pertinent negatives include no anxiety, chest pain, dizziness, fever, shortness of breath or weakness. She has tried nothing for the symptoms. Risk factors include sedentary lifestyle. There is no history of anemia.     Past Medical History:  Diagnosis Date  . Abnormal glucose level   . Benign essential HTN   . High blood pressure   . High cholesterol   . Lumbago   . Osteoporosis   . Sciatica   . Vitamin D deficiency      Family History  Problem Relation Age of Onset  . Heart disease Mother   . Diabetes Mother   . Diabetes Brother   . Diabetes Sister      Current Outpatient Medications:  .  spironolactone (ALDACTONE) 25 MG tablet, Take 1 tablet (25 mg total) by mouth daily., Disp: 90 tablet, Rfl: 0 .  valsartan-hydrochlorothiazide (DIOVAN-HCT) 320-25 MG tablet, Take 1 tablet by mouth daily., Disp: , Rfl:    Allergies  Allergen Reactions  . Penicillins Rash     Review of Systems  Constitutional: Negative.  Negative for fatigue and fever.  Eyes: Negative for visual disturbance.  Respiratory: Negative.  Negative for  shortness of breath.   Cardiovascular: Negative.  Negative for chest pain, palpitations and leg swelling.  Gastrointestinal: Negative.   Endocrine: Negative.  Negative for polydipsia, polyphagia and polyuria.  Musculoskeletal: Negative.   Skin: Negative.   Neurological: Negative for dizziness, weakness and headaches.  Psychiatric/Behavioral: Negative for confusion. The patient is not nervous/anxious.      Today's Vitals   04/04/18 1015  BP: (!) 140/92  Pulse: 76  Temp: 97.8 F (36.6 C)  TempSrc: Oral  SpO2: 94%  Weight: 158 lb (71.7 kg)  Height: 5' 3.6" (1.615 m)  PainSc: 0-No pain   Body mass index is 27.46 kg/m.   Objective:  Physical Exam Vitals signs reviewed.  Constitutional:      General: She is not in acute distress.    Appearance: Normal appearance. She is well-developed.  HENT:     Head: Normocephalic and atraumatic.  Eyes:     Pupils: Pupils are equal, round, and reactive to light.  Cardiovascular:     Rate and Rhythm: Normal rate and regular rhythm.     Pulses: Normal pulses.     Heart sounds: Normal heart sounds. No murmur.  Pulmonary:     Effort: Pulmonary effort is normal.     Breath sounds: Normal breath sounds.  Musculoskeletal: Normal range of motion.  Skin:    General: Skin is warm and dry.     Capillary Refill: Capillary refill takes less than 2 seconds.  Neurological:  General: No focal deficit present.     Mental Status: She is alert and oriented to person, place, and time.     Cranial Nerves: No cranial nerve deficit.  Psychiatric:        Mood and Affect: Mood normal.        Behavior: Behavior normal.         Assessment And Plan:     1. Essential hypertension . B/P is elevated today she has not taken her blood pressure medication due to being 937 systolic this morning. I have encouraged her to take her blood pressure medication even when 120 only if less than 100 hold her medication.    . CMP ordered to check renal function.   . The importance of regular exercise and dietary modification was stressed to the patient.  . Stressed importance of losing ten percent of her body weight to help with B/P control.  . The weight loss would help with decreasing cardiac and cancer risk as well.  - CMP14 + Anion Gap  2. Prediabetes  Chronic, controlled  Continue with current medications  Encouraged to limit intake of sugary foods and drinks  Encouraged to increase physical activity to 150 minutes per week - Hemoglobin A1c  3. Osteopenia of neck of right femur  Discussed her results of bone density  She is to take 3,000 units vitamin d daily and 1200 mg calcium daily  4. Palpitation  Intermittent episodes none recently  Encouraged to avoid caffeine and to increase water intake  Advised stress can also cause these symptoms   Minette Brine, FNP

## 2018-04-04 NOTE — Patient Instructions (Addendum)
Prediabetes Eating Plan Prediabetes is a condition that causes blood sugar (glucose) levels to be higher than normal. This increases the risk for developing diabetes. In order to prevent diabetes from developing, your health care provider may recommend a diet and other lifestyle changes to help you:  Control your blood glucose levels.  Improve your cholesterol levels.  Manage your blood pressure. Your health care provider may recommend working with a diet and nutrition specialist (dietitian) to make a meal plan that is best for you. What are tips for following this plan? Lifestyle  Set weight loss goals with the help of your health care team. It is recommended that most people with prediabetes lose 7% of their current body weight.  Exercise for at least 30 minutes at least 5 days a week.  Attend a support group or seek ongoing support from a mental health counselor.  Take over-the-counter and prescription medicines only as told by your health care provider. Reading food labels  Read food labels to check the amount of fat, salt (sodium), and sugar in prepackaged foods. Avoid foods that have: ? Saturated fats. ? Trans fats. ? Added sugars.  Avoid foods that have more than 300 milligrams (mg) of sodium per serving. Limit your daily sodium intake to less than 2,300 mg each day. Shopping  Avoid buying pre-made and processed foods. Cooking  Cook with olive oil. Do not use butter, lard, or ghee.  Bake, broil, grill, or boil foods. Avoid frying. Meal planning   Work with your dietitian to develop an eating plan that is right for you. This may include: ? Tracking how many calories you take in. Use a food diary, notebook, or mobile application to track what you eat at each meal. ? Using the glycemic index (GI) to plan your meals. The index tells you how quickly a food will raise your blood glucose. Choose low-GI foods. These foods take a longer time to raise blood glucose.  Consider  following a Mediterranean diet. This diet includes: ? Several servings each day of fresh fruits and vegetables. ? Eating fish at least twice a week. ? Several servings each day of whole grains, beans, nuts, and seeds. ? Using olive oil instead of other fats. ? Moderate alcohol consumption. ? Eating small amounts of red meat and whole-fat dairy.  If you have high blood pressure, you may need to limit your sodium intake or follow a diet such as the DASH eating plan. DASH is an eating plan that aims to lower high blood pressure. What foods are recommended? The items listed below may not be a complete list. Talk with your dietitian about what dietary choices are best for you. Grains Whole grains, such as whole-wheat or whole-grain breads, crackers, cereals, and pasta. Unsweetened oatmeal. Bulgur. Barley. Quinoa. Brown rice. Corn or whole-wheat flour tortillas or taco shells. Vegetables Lettuce. Spinach. Peas. Beets. Cauliflower. Cabbage. Broccoli. Carrots. Tomatoes. Squash. Eggplant. Herbs. Peppers. Onions. Cucumbers. Brussels sprouts. Fruits Berries. Bananas. Apples. Oranges. Grapes. Papaya. Mango. Pomegranate. Kiwi. Grapefruit. Cherries. Meats and other protein foods Seafood. Poultry without skin. Lean cuts of pork and beef. Tofu. Eggs. Nuts. Beans. Dairy Low-fat or fat-free dairy products, such as yogurt, cottage cheese, and cheese. Beverages Water. Tea. Coffee. Sugar-free or diet soda. Seltzer water. Lowfat or no-fat milk. Milk alternatives, such as soy or almond milk. Fats and oils Olive oil. Canola oil. Sunflower oil. Grapeseed oil. Avocado. Walnuts. Sweets and desserts Sugar-free or low-fat pudding. Sugar-free or low-fat ice cream and other frozen treats.   Seasoning and other foods Herbs. Sodium-free spices. Mustard. Relish. Low-fat, low-sugar ketchup. Low-fat, low-sugar barbecue sauce. Low-fat or fat-free mayonnaise. What foods are not recommended? The items listed below may not be a  complete list. Talk with your dietitian about what dietary choices are best for you. Grains Refined white flour and flour products, such as bread, pasta, snack foods, and cereals. Vegetables Canned vegetables. Frozen vegetables with butter or cream sauce. Fruits Fruits canned with syrup. Meats and other protein foods Fatty cuts of meat. Poultry with skin. Breaded or fried meat. Processed meats. Dairy Full-fat yogurt, cheese, or milk. Beverages Sweetened drinks, such as sweet iced tea and soda. Fats and oils Butter. Lard. Ghee. Sweets and desserts Baked goods, such as cake, cupcakes, pastries, cookies, and cheesecake. Seasoning and other foods Spice mixes with added salt. Ketchup. Barbecue sauce. Mayonnaise. Summary  To prevent diabetes from developing, you may need to make diet and other lifestyle changes to help control blood sugar, improve cholesterol levels, and manage your blood pressure.  Set weight loss goals with the help of your health care team. It is recommended that most people with prediabetes lose 7 percent of their current body weight.  Consider following a Mediterranean diet that includes plenty of fresh fruits and vegetables, whole grains, beans, nuts, seeds, fish, lean meat, low-fat dairy, and healthy oils. This information is not intended to replace advice given to you by your health care provider. Make sure you discuss any questions you have with your health care provider. Document Released: 07/08/2014 Document Revised: 04/27/2016 Document Reviewed: 04/27/2016 Elsevier Interactive Patient Education  2019 [employer]. Hypertension Hypertension, commonly called high blood pressure, is when the force of blood pumping through the arteries is too strong. The arteries are the blood vessels that carry blood from the heart throughout the body. Hypertension forces the heart to work harder to pump blood and may cause arteries to become narrow or stiff. Having untreated or  uncontrolled hypertension can cause heart attacks, strokes, kidney disease, and other problems. A blood pressure reading consists of a higher number over a lower number. Ideally, your blood pressure should be below 120/80. The first ("top") number is called the systolic pressure. It is a measure of the pressure in your arteries as your heart beats. The second ("bottom") number is called the diastolic pressure. It is a measure of the pressure in your arteries as the heart relaxes. What are the causes? The cause of this condition is not known. What increases the risk? Some risk factors for high blood pressure are under your control. Others are not. Factors you can change  Smoking.  Having type 2 diabetes mellitus, high cholesterol, or both.  Not getting enough exercise or physical activity.  Being overweight.  Having too much fat, sugar, calories, or salt (sodium) in your diet.  Drinking too much alcohol. Factors that are difficult or impossible to change  Having chronic kidney disease.  Having a family history of high blood pressure.  Age. Risk increases with age.  Race. You may be at higher risk if you are African-American.  Gender. Men are at higher risk than women before age 84. After age 4, women are at higher risk than men.  Having obstructive sleep apnea.  Stress. What are the signs or symptoms? Extremely high blood pressure (hypertensive crisis) may cause:  Headache.  Anxiety.  Shortness of breath.  Nosebleed.  Nausea and vomiting.  Severe chest pain.  Jerky movements you cannot control (seizures). How is this diagnosed?  This condition is diagnosed by measuring your blood pressure while you are seated, with your arm resting on a surface. The cuff of the blood pressure monitor will be placed directly against the skin of your upper arm at the level of your heart. It should be measured at least twice using the same arm. Certain conditions can cause a difference  in blood pressure between your right and left arms. Certain factors can cause blood pressure readings to be lower or higher than normal (elevated) for a short period of time:  When your blood pressure is higher when you are in a health care provider's office than when you are at home, this is called white coat hypertension. Most people with this condition do not need medicines.  When your blood pressure is higher at home than when you are in a health care provider's office, this is called masked hypertension. Most people with this condition may need medicines to control blood pressure. If you have a high blood pressure reading during one visit or you have normal blood pressure with other risk factors:  You may be asked to return on a different day to have your blood pressure checked again.  You may be asked to monitor your blood pressure at home for 1 week or longer. If you are diagnosed with hypertension, you may have other blood or imaging tests to help your health care provider understand your overall risk for other conditions. How is this treated? This condition is treated by making healthy lifestyle changes, such as eating healthy foods, exercising more, and reducing your alcohol intake. Your health care provider may prescribe medicine if lifestyle changes are not enough to get your blood pressure under control, and if:  Your systolic blood pressure is above 130.  Your diastolic blood pressure is above 80. Your personal target blood pressure may vary depending on your medical conditions, your age, and other factors. Follow these instructions at home: Eating and drinking   Eat a diet that is high in fiber and potassium, and low in sodium, added sugar, and fat. An example eating plan is called the DASH (Dietary Approaches to Stop Hypertension) diet. To eat this way: ? Eat plenty of fresh fruits and vegetables. Try to fill half of your plate at each meal with fruits and vegetables. ? Eat  whole grains, such as whole wheat pasta, brown rice, or whole grain bread. Fill about one quarter of your plate with whole grains. ? Eat or drink low-fat dairy products, such as skim milk or low-fat yogurt. ? Avoid fatty cuts of meat, processed or cured meats, and poultry with skin. Fill about one quarter of your plate with lean proteins, such as fish, chicken without skin, beans, eggs, and tofu. ? Avoid premade and processed foods. These tend to be higher in sodium, added sugar, and fat.  Reduce your daily sodium intake. Most people with hypertension should eat less than 1,500 mg of sodium a day.  Limit alcohol intake to no more than 1 drink a day for nonpregnant women and 2 drinks a day for men. One drink equals 12 oz of beer, 5 oz of wine, or 1 oz of hard liquor. Lifestyle   Work with your health care provider to maintain a healthy body weight or to lose weight. Ask what an ideal weight is for you.  Get at least 30 minutes of exercise that causes your heart to beat faster (aerobic exercise) most days of the week. Activities may include walking, swimming,  or biking.  Include exercise to strengthen your muscles (resistance exercise), such as pilates or lifting weights, as part of your weekly exercise routine. Try to do these types of exercises for 30 minutes at least 3 days a week.  Do not use any products that contain nicotine or tobacco, such as cigarettes and e-cigarettes. If you need help quitting, ask your health care provider.  Monitor your blood pressure at home as told by your health care provider.  Keep all follow-up visits as told by your health care provider. This is important. Medicines  Take over-the-counter and prescription medicines only as told by your health care provider. Follow directions carefully. Blood pressure medicines must be taken as prescribed.  Do not skip doses of blood pressure medicine. Doing this puts you at risk for problems and can make the medicine less  effective.  Ask your health care provider about side effects or reactions to medicines that you should watch for. Contact a health care provider if:  You think you are having a reaction to a medicine you are taking.  You have headaches that keep coming back (recurring).  You feel dizzy.  You have swelling in your ankles.  You have trouble with your vision. Get help right away if:  You develop a severe headache or confusion.  You have unusual weakness or numbness.  You feel faint.  You have severe pain in your chest or abdomen.  You vomit repeatedly.  You have trouble breathing. Summary  Hypertension is when the force of blood pumping through your arteries is too strong. If this condition is not controlled, it may put you at risk for serious complications.  Your personal target blood pressure may vary depending on your medical conditions, your age, and other factors. For most people, a normal blood pressure is less than 120/80.  Hypertension is treated with lifestyle changes, medicines, or a combination of both. Lifestyle changes include weight loss, eating a healthy, low-sodium diet, exercising more, and limiting alcohol. This information is not intended to replace advice given to you by your health care provider. Make sure you discuss any questions you have with your health care provider. Document Released: 02/21/2005 Document Revised: 01/20/2016 Document Reviewed: 01/20/2016 Elsevier Interactive Patient Education  2019 [employer]     Osteopenia  Osteopenia is a loss of thickness (density) inside of the bones. Another name for osteopenia is low bone mass. Mild osteopenia is a normal part of aging. It is not a disease, and it does not cause symptoms. However, if you have osteopenia and continue to lose bone mass, you could develop a condition that causes the bones to become thin and break more easily (osteoporosis). You may also lose some height, have back pain, and  have a stooped posture. Although osteopenia is not a disease, making changes to your lifestyle and diet can help to prevent osteopenia from developing into osteoporosis. What are the causes? Osteopenia is caused by loss of calcium in the bones.  Bones are constantly changing. Old bone cells are continually being replaced with new bone cells. This process builds new bone. The mineral calcium is needed to build new bone and maintain bone density. Bone density is usually highest around age 58. After that, most people's bodies cannot replace all the bone they have lost with new bone. What increases the risk? You are more likely to develop this condition if:  You are older than age 77.  You are a woman who went through menopause early.  You have a long illness that keeps you in bed.  You do not get enough exercise.  You lack certain nutrients (malnutrition).  You have an overactive thyroid gland (hyperthyroidism).  You smoke.  You drink a lot of alcohol.  You are taking medicines that weaken the bones, such as steroids. What are the signs or symptoms? This condition does not cause any symptoms. You may have a slightly higher risk for bone breaks (fractures), so getting fractures more easily than normal may be an indication of osteopenia. How is this diagnosed? Your health care provider can diagnose this condition with a special type of X-ray exam that measures bone density (dual-energy X-ray absorptiometry, DEXA). This test can measure bone density in your hips, spine, and wrists. Osteopenia has no symptoms, so this condition is usually diagnosed after a routine bone density screening test is done for osteoporosis. This routine screening is usually done for:  Women who are age 81 or older.  Men who are age 65 or older. If you have risk factors for osteopenia, you may have the screening test at an earlier age. How is this treated? Making dietary and lifestyle changes can lower your risk  for osteoporosis. If you have severe osteopenia that is close to becoming osteoporosis, your health care provider may prescribe medicines and dietary supplements such as calcium and vitamin D. These supplements help to rebuild bone density. Follow these instructions at home:   Take over-the-counter and prescription medicines only as told by your health care provider. These include vitamins and supplements.  Eat a diet that is high in calcium and vitamin D. ? Calcium is found in dairy products, beans, salmon, and leafy green vegetables like spinach and broccoli. ? Look for foods that have vitamin D and calcium added to them (fortified foods), such as orange juice, cereal, and bread.  Do 30 or more minutes of a weight-bearing exercise every day, such as walking, jogging, or playing a sport. These types of exercises strengthen the bones.  Take precautions at home to lower your risk of falling, such as: ? Keeping rooms well-lit and free of clutter, such as cords. ? Installing safety rails on stairs. ? Using rubber mats in the bathroom or other areas that are often wet or slippery.  Do not use any products that contain nicotine or tobacco, such as cigarettes and e-cigarettes. If you need help quitting, ask your health care provider.  Avoid alcohol or limit alcohol intake to no more than 1 drink a day for nonpregnant women and 2 drinks a day for men. One drink equals 12 oz of beer, 5 oz of wine, or 1 oz of hard liquor.  Keep all follow-up visits as told by your health care provider. This is important. Contact a health care provider if:  You have not had a bone density screening for osteoporosis and you are: ? A woman, age 18 or older. ? A man, age 38 or older.  You are a postmenopausal woman who has not had a bone density screening for osteoporosis.  You are older than age 73 and you want to know if you should have bone density screening for osteoporosis. Summary  Osteopenia is a loss of  thickness (density) inside of the bones. Another name for osteopenia is low bone mass.  Osteopenia is not a disease, but it may increase your risk for a condition that causes the bones to become thin and break more easily (osteoporosis).  You may be at risk for osteopenia  if you are older than age 44 or if you are a woman who went through early menopause.  Osteopenia does not cause any symptoms, but it can be diagnosed with a bone density screening test.  Dietary and lifestyle changes are the first treatment for osteopenia. These may lower your risk for osteoporosis. This information is not intended to replace advice given to you by your health care provider. Make sure you discuss any questions you have with your health care provider. Document Released: 11/30/2016 Document Revised: 11/30/2016 Document Reviewed: 11/30/2016 Elsevier Interactive Patient Education  2019 [employer]    Take 3,000 units of vitamin d daily and 1200 mg calcium daily  We will recheck your bone density this year in December.

## 2018-07-04 ENCOUNTER — Ambulatory Visit: Payer: Medicare Other | Admitting: Nurse Practitioner

## 2018-07-05 ENCOUNTER — Other Ambulatory Visit: Payer: Self-pay

## 2018-07-05 ENCOUNTER — Encounter: Payer: Self-pay | Admitting: Nurse Practitioner

## 2018-07-05 ENCOUNTER — Ambulatory Visit (INDEPENDENT_AMBULATORY_CARE_PROVIDER_SITE_OTHER): Payer: Medicare Other | Admitting: Nurse Practitioner

## 2018-07-05 VITALS — BP 144/84 | HR 90 | Temp 98.5°F | Ht 64.2 in | Wt 164.0 lb

## 2018-07-05 DIAGNOSIS — R7303 Prediabetes: Secondary | ICD-10-CM | POA: Diagnosis not present

## 2018-07-05 DIAGNOSIS — M79661 Pain in right lower leg: Secondary | ICD-10-CM | POA: Diagnosis not present

## 2018-07-05 DIAGNOSIS — I10 Essential (primary) hypertension: Secondary | ICD-10-CM | POA: Diagnosis not present

## 2018-07-05 DIAGNOSIS — M6281 Muscle weakness (generalized): Secondary | ICD-10-CM | POA: Diagnosis not present

## 2018-07-05 MED ORDER — VALSARTAN-HYDROCHLOROTHIAZIDE 320-25 MG PO TABS: 1.0000 | ORAL_TABLET | Freq: Every day | ORAL | 1 refills | Status: DC

## 2018-07-05 MED ORDER — SPIRONOLACTONE 25 MG PO TABS: 25.0000 mg | ORAL_TABLET | Freq: Every day | ORAL | 1 refills | Status: DC

## 2018-07-05 NOTE — Progress Notes (Signed)
Subjective:     Patient ID: Julia Gates , female    DOB: February 03, 1946 , 73 y.o.   MRN: [id number]   Chief Complaint  Patient presents with  . Hypertension    HPI  She has been trying a new seasoning recently  Also having right side arm pain   Hypertension  This is a chronic problem. The current episode started more than 1 year ago. The problem has been gradually worsening since onset. The problem is uncontrolled. Pertinent negatives include no anxiety, blurred vision, chest pain, headaches or palpitations. There are no associated agents to hypertension. Risk factors for coronary artery disease include sedentary lifestyle. Past treatments include angiotensin blockers and diuretics. The current treatment provides mild improvement. There are no compliance problems.  There is no history of angina, kidney disease or CVA. There is no history of chronic renal disease.  Leg Pain   The incident occurred more than 1 week ago. There was no injury mechanism. The pain is present in the right leg. The pain is mild. She reports no foreign bodies present. She has tried nothing for the symptoms. The treatment provided no relief.     Past Medical History:  Diagnosis Date  . Abnormal glucose level   . Benign essential HTN   . High blood pressure   . High cholesterol   . Lumbago   . Osteoporosis   . Sciatica   . Vitamin D deficiency      Family History  Problem Relation Age of Onset  . Heart disease Mother   . Diabetes Mother   . Diabetes Brother   . Diabetes Sister      Current Outpatient Medications:  .  spironolactone (ALDACTONE) 25 MG tablet, Take 1 tablet (25 mg total) by mouth daily., Disp: 90 tablet, Rfl: 0 .  valsartan-hydrochlorothiazide (DIOVAN-HCT) 320-25 MG tablet, Take 1 tablet by mouth daily., Disp: , Rfl:    Allergies  Allergen Reactions  . Penicillins Rash     Review of Systems  Constitutional: Negative.   Eyes: Negative for blurred vision.  Respiratory: Negative.    Cardiovascular: Negative.  Negative for chest pain, palpitations and leg swelling.  Musculoskeletal: Negative.        Having a shooting pain from ankle up to arm.    Neurological: Negative for dizziness and headaches.     Today's Vitals   07/05/18 0930  BP: (!) 144/84  Pulse: 90  Temp: 98.5 F (36.9 C)  TempSrc: Oral  Weight: 164 lb (74.4 kg)  Height: 5' 4.2" (1.631 m)  PainSc: 0-No pain   Body mass index is 27.98 kg/m.   Objective:  Physical Exam Vitals signs reviewed.  Constitutional:      Appearance: Normal appearance.  Cardiovascular:     Rate and Rhythm: Normal rate and regular rhythm.  Pulmonary:     Effort: Pulmonary effort is normal.     Breath sounds: Normal breath sounds.  Musculoskeletal:        General: No swelling or tenderness.     Comments: Right lower extremity strength 4/5  Left lower extremity strength 5/5  Skin:    General: Skin is warm and dry.     Capillary Refill: Capillary refill takes less than 2 seconds.  Neurological:     General: No focal deficit present.     Mental Status: She is alert and oriented to person, place, and time.  Psychiatric:        Mood and Affect: Mood normal.  Behavior: Behavior normal.        Thought Content: Thought content normal.        Judgment: Judgment normal.         Assessment And Plan:   1. Essential hypertension  Blood pressure elevated today likely related to seasoning  2. Prediabetes  Chronic, controlled  Continue with current medications  Encouraged to limit intake of sugary foods and drinks  Encouraged to increase physical activity to 150 minutes per week  - CBC no Diff - BMP8+eGFR  3. Muscle weakness (generalized)  Slightly decrease in strength to right lower extremity  Will check ct scan since she is reporting this is new to evaluate for structural damage - CT Head Wo Contrast   Minette Brine, FNP    THE PATIENT IS ENCOURAGED TO PRACTICE SOCIAL DISTANCING DUE TO THE  COVID-19 PANDEMIC.

## 2018-07-06 LAB — CBC
Hematocrit: 39.7 % (ref 34.0–46.6)
Hemoglobin: 13.5 g/dL (ref 11.1–15.9)
MCH: 30.4 pg (ref 26.6–33.0)
MCHC: 34 g/dL (ref 31.5–35.7)
MCV: 89 fL (ref 79–97)
Platelets: 217 10*3/uL (ref 150–450)
RBC: 4.44 x10E6/uL (ref 3.77–5.28)
RDW: 12.7 % (ref 11.7–15.4)
WBC: 5.5 10*3/uL (ref 3.4–10.8)

## 2018-07-06 LAB — BMP8+EGFR
BUN/Creatinine Ratio: 16 (ref 12–28)
BUN: 15 mg/dL (ref 8–27)
CO2: 22 mmol/L (ref 20–29)
Calcium: 9.9 mg/dL (ref 8.7–10.3)
Chloride: 99 mmol/L (ref 96–106)
Creatinine, Ser: 0.94 mg/dL (ref 0.57–1.00)
GFR calc Af Amer: 71 mL/min/{1.73_m2} (ref >59)
GFR calc non Af Amer: 61 mL/min/{1.73_m2} (ref >59)
Glucose: 96 mg/dL (ref 65–99)
Potassium: 4 mmol/L (ref 3.5–5.2)
Sodium: 139 mmol/L (ref 134–144)

## 2018-07-06 LAB — HEMOGLOBIN A1C
Est. average glucose Bld gHb Est-mCnc: 137 mg/dL
Hgb A1c MFr Bld: 6.4 % — ABNORMAL HIGH (ref 4.8–5.6)

## 2018-07-10 ENCOUNTER — Other Ambulatory Visit: Payer: Self-pay | Admitting: Nurse Practitioner

## 2018-07-11 ENCOUNTER — Telehealth: Payer: Self-pay

## 2018-07-11 NOTE — Telephone Encounter (Signed)
Pt of [facility] -called stating that she has been having leg cramping and numbness in lower rt leg x 3 days. SOB and chest pressure has started as well and her bp is running 140-150's/70-80's. She is wanting to be seen due to just not feeling well. Please advise.//ah

## 2018-07-11 NOTE — Telephone Encounter (Signed)
Pt aware. Call transferred to ER to schedule.//ah

## 2018-07-11 NOTE — Telephone Encounter (Signed)
Sure, she can be scheduled for an appt. Either later this week or early next week depending upon availability.

## 2018-07-12 ENCOUNTER — Other Ambulatory Visit: Payer: Self-pay

## 2018-07-12 ENCOUNTER — Encounter: Payer: Self-pay | Admitting: Cardiology

## 2018-07-12 ENCOUNTER — Ambulatory Visit (INDEPENDENT_AMBULATORY_CARE_PROVIDER_SITE_OTHER): Payer: Medicare Other | Admitting: Cardiology

## 2018-07-12 VITALS — BP 141/83 | HR 71 | Temp 97.6°F | Ht 64.2 in | Wt 161.0 lb

## 2018-07-12 DIAGNOSIS — R7303 Prediabetes: Secondary | ICD-10-CM

## 2018-07-12 DIAGNOSIS — R0789 Other chest pain: Secondary | ICD-10-CM

## 2018-07-12 DIAGNOSIS — R0609 Other forms of dyspnea: Secondary | ICD-10-CM

## 2018-07-12 DIAGNOSIS — I10 Essential (primary) hypertension: Secondary | ICD-10-CM | POA: Diagnosis not present

## 2018-07-12 DIAGNOSIS — E7800 Pure hypercholesterolemia, unspecified: Secondary | ICD-10-CM | POA: Diagnosis not present

## 2018-07-12 NOTE — Progress Notes (Signed)
Primary Physician/Referring:  Minette Brine, FNP  Patient ID: Julia Gates, female    DOB: [birth year], 73 y.o.   MRN: [id number]  Chief Complaint  Patient presents with  . Shortness of Breath    pt c/o leg cramping, chest pressure and SOB   . Chest Pain    HPI: Julia Gates  is a 73 y.o. female  with difficult control hypertension, diet-controlled diabetes, chronic shortness breath and hypertension with hypertensive heart disease, is very particular with need to taking her medications, does not want to be on statins, presents for evaluation of chest pain and dyspnea and self made appointment today.  Patient started having chest pressure, retrosternal and also to the left side of the chest about a week ago.  Symptoms comes on and off, can last Totally forfor several hours, usually episodes come on and off lasting 30 minutes to an hour.  She is also noticed marked dyspnea on exertion, climbing 1 flight of stairs she gets markedly dyspneic. BP has been stable and blood sugar has been high. Not on any medications for hyperglycemia.  Past Medical History:  Diagnosis Date  . Abnormal glucose level   . High cholesterol   . Lumbago   . Sciatica   . Vitamin D deficiency     History reviewed. No pertinent surgical history.  Social History   Socioeconomic History  . Marital status: Married    Spouse name: Not on file  . Number of children: 3  . Years of education: Not on file  . Highest education level: Not on file  Occupational History  . Occupation: retired  Scientific laboratory technician  . Financial resource strain: Not hard at all  . Food insecurity:    Worry: Never true    Inability: Never true  . Transportation needs:    Medical: No    Non-medical: No  Tobacco Use  . Smoking status: Never Smoker  . Smokeless tobacco: Never Used  Substance and Sexual Activity  . Alcohol use: No    Comment: drinks tea occ  . Drug use: No  . Sexual activity: Yes  Lifestyle  . Physical activity:     Days per week: 0 days    Minutes per session: 0 min  . Stress: Not at all  Relationships  . Social connections:    Talks on phone: Not on file    Gets together: Not on file    Attends religious service: Not on file    Active member of club or organization: Not on file    Attends meetings of clubs or organizations: Not on file    Relationship status: Not on file  . Intimate partner violence:    Fear of current or ex partner: No    Emotionally abused: No    Physically abused: No    Forced sexual activity: No  Other Topics Concern  . Not on file  Social History Narrative   Patient lives at home with her husband and she is retired. She has a high school education and 3 children    Current Outpatient Medications on File Prior to Visit  Medication Sig Dispense Refill  . Ascorbic Acid (VITAMIN C) 100 MG tablet Take 100 mg by mouth as needed.    . cholecalciferol (VITAMIN D3) 25 MCG (1000 UT) tablet Take 1,000 Units by mouth daily.    Marland Kitchen co-enzyme Q-10 30 MG capsule Take 30 mg by mouth 3 (three) times daily.    . NON FORMULARY Green protein drink    .  NON FORMULARY Black seed drink    . Omega-3-6-9 CAPS Take by mouth.    . spironolactone (ALDACTONE) 25 MG tablet Take 1 tablet (25 mg total) by mouth daily. 15 tablet 1  . valsartan-hydrochlorothiazide (DIOVAN-HCT) 320-25 MG tablet Take 1 tablet by mouth daily. 15 tablet 1   No current facility-administered medications on file prior to visit.     Review of Systems  Constitution: Negative for chills, decreased appetite, malaise/fatigue and weight gain.  Cardiovascular: Positive for chest pain (pressure). Negative for dyspnea on exertion, leg swelling, palpitations and syncope.  Respiratory: Positive for shortness of breath and snoring. Negative for hemoptysis.   Endocrine: Negative for cold intolerance.  Hematologic/Lymphatic: Does not bruise/bleed easily.  Musculoskeletal: Negative for joint swelling.  Gastrointestinal: Negative for  abdominal pain, anorexia and change in bowel habit.  Neurological: Positive for paresthesias (right leg cramps and left arm tingling). Negative for headaches and light-headedness.  Psychiatric/Behavioral: Negative for depression and substance abuse.  All other systems reviewed and are negative.     Objective  Blood pressure (!) 141/83, pulse 71, temperature 97.6 F (36.4 C), height 5' 4.2" (1.631 m), weight 161 lb (73 kg), SpO2 99 %. Body mass index is 27.46 kg/m.    Physical Exam  Constitutional: She appears well-developed and well-nourished. No distress.  HENT:  Head: Atraumatic.  Eyes: Conjunctivae are normal.  Neck: Neck supple. No JVD present. No thyromegaly present.  Cardiovascular: Normal rate, regular rhythm, normal heart sounds and intact distal pulses. Exam reveals no gallop.  No murmur heard. Pulmonary/Chest: Effort normal and breath sounds normal.  Abdominal: Soft. Bowel sounds are normal.  Musculoskeletal: Normal range of motion.        General: No edema.  Neurological: She is alert.  Skin: Skin is warm and dry.  Psychiatric: She has a normal mood and affect.   Radiology: No results found.  Laboratory examination:    CMP Latest Ref Rng & Units 07/05/2018 04/04/2018 12/28/2017  Glucose 65 - 99 mg/dL 96 96 85  BUN 8 - 27 mg/dL 15 19 13   Creatinine 0.57 - 1.00 mg/dL 0.94 0.90 0.94  Sodium 134 - 144 mmol/L 139 139 143  Potassium 3.5 - 5.2 mmol/L 4.0 4.3 4.1  Chloride 96 - 106 mmol/L 99 101 108(H)  CO2 20 - 29 mmol/L 22 22 16(L)  Calcium 8.7 - 10.3 mg/dL 9.9 9.8 9.5  Total Protein 6.0 - 8.5 g/dL - 6.9 7.0  Total Bilirubin 0.0 - 1.2 mg/dL - 1.0 0.5  Alkaline Phos 39 - 117 IU/L - 57 59  AST 0 - 40 IU/L - 20 21  ALT 0 - 32 IU/L - 15 20   CBC Latest Ref Rng & Units 07/05/2018 12/28/2017 12/27/2016  WBC 3.4 - 10.8 x10E3/uL 5.5 4.5 4.3  Hemoglobin 11.1 - 15.9 g/dL 13.5 13.3 13.8  Hematocrit 34.0 - 46.6 % 39.7 39.7 42.1  Platelets 150 - 450 x10E3/uL 217 192 195.0    Lipid Panel     Component Value Date/Time   CHOL 183 12/28/2017 1154   TRIG 74 12/28/2017 1154   HDL 57 12/28/2017 1154   CHOLHDL 3.2 12/28/2017 1154   LDLCALC 111 (H) 12/28/2017 1154   HEMOGLOBIN A1C Lab Results  Component Value Date   HGBA1C 6.4 (H) 07/05/2018   TSH No results for input(s): TSH in the last 8760 hours.  Cardiac Studies:    Nuclear stress test [June 27, 2015]: 1. The resting electrocardiogram demonstrated normal sinus rhythm, RBBB and no resting arrhythmias. The  stress electrocardiogram was normal. Patient exercised on Bruce protocol for 5:03 minutes and achieved 7.05 METS. Stress test terminated due to target heart rate( 87% MPHR) and dyspnea. 2. Myocardial perfusion imaging is normal. Overall left ventricular systolic function was normal without regional wall motion abnormalities. The left ventricular ejection fraction was 80%.  Renal Dopplers [05/13/2015]: No evidence of renal artery occlusive disease in either renal artery. Normal intrarenal vascular perfusion is noted in both kidneys. Mild increase in the echogenicity of both kidneys suggest medico-renal disease. Normal sized kidneys. Compared to the study done on 01/09/14, right renal artery stenosis not evident in the present study.  Echocardiogram [12/05/2013]:  - Left ventricle cavity is normal in size. Moderate concentric hypertrophy of the left ventricle. Hyperdynamic global wall motion. Calculated EF- 70% Doppler evidence of grade I (impaired) diastolic dysfunction. Calculated EF 70%. - Left atrial cavity is slightly dilated. An aneurysm of interatrial septum is seen. No convincing findings for PFO on this study. - Structurally normal mitral valve with mild regurgitation. Mild tricuspid regurgitation. No evidence of pulmonary hypertension.  Renal Dopplers [01/09/2014]: Renal artery duplex: Hemodynamically significant stenosis of the right renal artery. Normal intrarenal vascular perfusion is noted in both  kidneys. < 50% stenosis of the left renal artery ostium. Bilateral renal size is normal.  Assessment   Atypical chest pain - Plan: EKG 12-Lead  Dyspnea on exertion - Plan: EKG 12-Lead, PCV ECHOCARDIOGRAM COMPLETE  Essential hypertension  Mild hypercholesterolemia  Pre-diabetes  EKG 07/12/2018: Normal sinus rhythm at rate of 71 bpm, left atrial enlargement, normal axis.  Right bundle branch block.  No evidence of ischemia. No significant change from  EKG 05/08/2017  Recommendations:    Ms. Julia Gates is a pleasant Afro-American female with difficult control hypertension, diet-controlled diabetes, chronic shortness breath and hypertension with hypertensive heart wanted to be seen due to chest tightness and worsening dyspnea. CP appears to be either GERD, but CAD cannot be excluded. Patient feels comfortable with explanation, if symptoms persist will consider either repeating a stress test or cardiac coronary CTA.  Advised her that we should continue primary prevention with aggressive control of hypertension, diabetes and hyperlipidemia and daily exercise. Her dyspnea is related to diastolic dysfunction for hypertension, will obtain an echocardiogram.   She was previously on Lipitor but that caused her to have constipation hence had discontinued this. Given her pre diabetic state, I would highly recommend that she be on a statin, simvastatin would be appropriate at 40 mg with evening. She does not like to be on statins. She will think about this. I'd like to see her back in 4-6 weeks for follow-up.  Her main concern today has been pain and discomfort in her right leg that is radiating to her feet.  It appears to be more consistent with sciatica that she has had previously and could be a component of neuropathy.  She also discontinued metformin and states she is trying her best to control the diabetes with diet.   Adrian Prows, MD, [facility] 07/12/2018, 3:10 PM [facility] [place]  Pager: [phone] Office: [phone] If no answer Cell [phone]

## 2018-07-17 ENCOUNTER — Other Ambulatory Visit: Payer: Medicare Other

## 2018-07-30 ENCOUNTER — Encounter: Payer: Self-pay | Admitting: Nurse Practitioner

## 2018-07-31 ENCOUNTER — Other Ambulatory Visit: Payer: Self-pay

## 2018-07-31 ENCOUNTER — Ambulatory Visit (INDEPENDENT_AMBULATORY_CARE_PROVIDER_SITE_OTHER): Payer: Medicare Other

## 2018-07-31 DIAGNOSIS — R0609 Other forms of dyspnea: Secondary | ICD-10-CM | POA: Diagnosis not present

## 2018-08-23 ENCOUNTER — Encounter: Payer: Self-pay | Admitting: Cardiology

## 2018-08-23 ENCOUNTER — Other Ambulatory Visit: Payer: Self-pay

## 2018-08-23 ENCOUNTER — Ambulatory Visit (INDEPENDENT_AMBULATORY_CARE_PROVIDER_SITE_OTHER): Payer: Medicare Other | Admitting: Cardiology

## 2018-08-23 VITALS — BP 124/86 | HR 79 | Ht 65.0 in | Wt 162.0 lb

## 2018-08-23 DIAGNOSIS — R0609 Other forms of dyspnea: Secondary | ICD-10-CM | POA: Diagnosis not present

## 2018-08-23 DIAGNOSIS — R0789 Other chest pain: Secondary | ICD-10-CM

## 2018-08-23 DIAGNOSIS — I10 Essential (primary) hypertension: Secondary | ICD-10-CM | POA: Diagnosis not present

## 2018-08-23 MED ORDER — LABETALOL HCL 100 MG PO TABS: 100.0000 mg | ORAL_TABLET | Freq: Two times a day (BID) | ORAL | 1 refills | Status: DC

## 2018-08-23 NOTE — Progress Notes (Signed)
Virtual Visit via Video Note: This visit type was conducted due to national recommendations for restrictions regarding the COVID-19 Pandemic (e.g. social distancing).  This format is felt to be most appropriate for this patient at this time.  All issues noted in this document were discussed and addressed.  No physical exam was performed (except for noted visual exam findings with Telehealth visits).  The patient has consented to conduct a Telehealth visit and understands insurance will be billed.   I connected with@, on 08/23/18 at  by a video enabled telemedicine application and verified that I am speaking with the correct person using two identifiers.   I discussed the limitations of evaluation and management by telemedicine and the availability of in person appointments. The patient expressed understanding and agreed to proceed.   I have discussed with patient regarding the safety during COVID Pandemic and steps and precautions to be taken including social distancing, frequent hand wash and use of detergent soap, gels with the patient. I asked the patient to avoid touching mouth, nose, eyes, ears with the hands. I encouraged regular walking around the neighborhood and exercise and regular diet, as long as social distancing can be maintained.  Primary Physician/Referring:  Minette Brine, FNP  Patient ID: Julia Gates, female    DOB: 1946-01-29, 73 y.o.   MRN: [id number]  Chief Complaint  Patient presents with  . Chest Pain  . DOE  . Results    Follow Up chest pain and echo  . Hypertension    HPI: Julia Gates  is a 73 y.o. female  with difficult control hypertension, diet-controlled diabetes, chronic shortness breath and hypertension with hypertensive heart disease, is very particular with need to taking her medications, does not want to be on statins, presents for evaluation of chest pain and dyspnea.  She was seen by me a month ago for chest pain which is suspect is probably  muscular skeletal versus GERD.  Due to dyspnea I recommended an echocardiogram which she underwent and presents for follow-up.  States that her blood pressure has still been high and although this morning was well controlled, usually the blood pressure has been around 150-160 range.  No PND or orthopnea.  No leg edema. She has not had any further episodes of chest pain since last office visit a month ago.  Past Medical History:  Diagnosis Date  . Abnormal glucose level   . High cholesterol   . Lumbago   . Sciatica   . Vitamin D deficiency     Past Surgical History:  Procedure Laterality Date  . TUBAL LIGATION     age 19    Social History   Socioeconomic History  . Marital status: Married    Spouse name: Not on file  . Number of children: 3  . Years of education: Not on file  . Highest education level: Not on file  Occupational History  . Occupation: retired  Scientific laboratory technician  . Financial resource strain: Not hard at all  . Food insecurity    Worry: Never true    Inability: Never true  . Transportation needs    Medical: No    Non-medical: No  Tobacco Use  . Smoking status: Never Smoker  . Smokeless tobacco: Never Used  Substance and Sexual Activity  . Alcohol use: No  . Drug use: No  . Sexual activity: Yes  Lifestyle  . Physical activity    Days per week: 0 days    Minutes per session: 0  min  . Stress: Not at all  Relationships  . Social Herbalist on phone: Not on file    Gets together: Not on file    Attends religious service: Not on file    Active member of club or organization: Not on file    Attends meetings of clubs or organizations: Not on file    Relationship status: Not on file  . Intimate partner violence    Fear of current or ex partner: No    Emotionally abused: No    Physically abused: No    Forced sexual activity: No  Other Topics Concern  . Not on file  Social History Narrative   Patient lives at home with her husband and she is  retired. She has a high school education and 3 children    Current Outpatient Medications on File Prior to Visit  Medication Sig Dispense Refill  . Ascorbic Acid (VITAMIN C) 100 MG tablet Take 100 mg by mouth as needed.    . cholecalciferol (VITAMIN D3) 25 MCG (1000 UT) tablet Take 1,000 Units by mouth daily.    . Multiple Vitamin (MULTIVITAMIN) tablet Take 1 tablet by mouth daily.    . NON FORMULARY Green protein drink    . NON FORMULARY Black seed drink    . Omega-3-6-9 CAPS Take by mouth.    . spironolactone (ALDACTONE) 25 MG tablet Take 1 tablet (25 mg total) by mouth daily. 15 tablet 1  . valsartan-hydrochlorothiazide (DIOVAN-HCT) 320-25 MG tablet Take 1 tablet by mouth daily. 15 tablet 1  . co-enzyme Q-10 30 MG capsule Take 30 mg by mouth 3 (three) times daily.     No current facility-administered medications on file prior to visit.     Review of Systems  Constitution: Negative for chills, decreased appetite, malaise/fatigue and weight gain.  Cardiovascular: Positive for dyspnea on exertion. Negative for chest pain, leg swelling, palpitations and syncope. Cyanosis: mild and stable.  Respiratory: Positive for snoring. Negative for hemoptysis.   Endocrine: Negative for cold intolerance.  Hematologic/Lymphatic: Does not bruise/bleed easily.  Musculoskeletal: Positive for joint pain (left should). Negative for joint swelling.  Gastrointestinal: Negative for abdominal pain, anorexia and change in bowel habit.  Neurological: Positive for paresthesias (right leg cramps and left arm tingling). Negative for headaches and light-headedness.  Psychiatric/Behavioral: Negative for depression and substance abuse.  All other systems reviewed and are negative.     Objective  Blood pressure 124/86, pulse 79, height 5\' 5"  (1.651 m), weight 162 lb (73.5 kg). Body mass index is 26.96 kg/m.   Physical exam not performed or limited due to virtual visit.  Patient appeared to be in no distress, Neck  was supple, respiration was not labored.  Please see exam details from prior visit is as below.  Physical Exam  Constitutional: She appears well-developed and well-nourished. No distress.  HENT:  Head: Atraumatic.  Eyes: Conjunctivae are normal.  Neck: Neck supple. No JVD present. No thyromegaly present.  Cardiovascular: Normal rate, regular rhythm, normal heart sounds and intact distal pulses. Exam reveals no gallop.  No murmur heard. Pulmonary/Chest: Effort normal and breath sounds normal.  Abdominal: Soft. Bowel sounds are normal.  Musculoskeletal: Normal range of motion.        General: No edema.  Neurological: She is alert.  Skin: Skin is warm and dry.  Psychiatric: She has a normal mood and affect.   Radiology: No results found.  Laboratory examination:    CMP Latest Ref Rng &  Units 07/05/2018 04/04/2018 12/28/2017  Glucose 65 - 99 mg/dL 96 96 85  BUN 8 - 27 mg/dL 15 19 13   Creatinine 0.57 - 1.00 mg/dL 0.94 0.90 0.94  Sodium 134 - 144 mmol/L 139 139 143  Potassium 3.5 - 5.2 mmol/L 4.0 4.3 4.1  Chloride 96 - 106 mmol/L 99 101 108(H)  CO2 20 - 29 mmol/L 22 22 16(L)  Calcium 8.7 - 10.3 mg/dL 9.9 9.8 9.5  Total Protein 6.0 - 8.5 g/dL - 6.9 7.0  Total Bilirubin 0.0 - 1.2 mg/dL - 1.0 0.5  Alkaline Phos 39 - 117 IU/L - 57 59  AST 0 - 40 IU/L - 20 21  ALT 0 - 32 IU/L - 15 20   CBC Latest Ref Rng & Units 07/05/2018 12/28/2017 12/27/2016  WBC 3.4 - 10.8 x10E3/uL 5.5 4.5 4.3  Hemoglobin 11.1 - 15.9 g/dL 13.5 13.3 13.8  Hematocrit 34.0 - 46.6 % 39.7 39.7 42.1  Platelets 150 - 450 x10E3/uL 217 192 195.0   Lipid Panel     Component Value Date/Time   CHOL 183 12/28/2017 1154   TRIG 74 12/28/2017 1154   HDL 57 12/28/2017 1154   CHOLHDL 3.2 12/28/2017 1154   LDLCALC 111 (H) 12/28/2017 1154   HEMOGLOBIN A1C Lab Results  Component Value Date   HGBA1C 6.4 (H) 07/05/2018   TSH No results for input(s): TSH in the last 8760 hours.  Cardiac Studies:   Echocardiogram  07/31/2018: Normal LV systolic function with EF 56%. Left ventricle cavity is normal in size. Moderate concentric hypertrophy of the left ventricle. Normal global wall motion. Doppler evidence of grade I (impaired) diastolic dysfunction, normal LAP. Calculated EF 56%. Left atrial cavity is mildly dilated. Mild (Grade I) mitral regurgitation. Mild to moderate tricuspid regurgitation. Estimated pulmonary artery systolic pressure 24 mmHg, which is normal.  No significant change compared to previous study in 2015  Nuclear stress test [06/15/2015]: 1. The resting electrocardiogram demonstrated normal sinus rhythm, RBBB and no resting arrhythmias. The stress electrocardiogram was normal. Patient exercised on Bruce protocol for 5:03 minutes and achieved 7.05 METS. Stress test terminated due to target heart rate( 87% MPHR) and dyspnea. 2. Myocardial perfusion imaging is normal. Overall left ventricular systolic function was normal without regional wall motion abnormalities. The left ventricular ejection fraction was 80%.  Renal Dopplers [05/13/2015]: No evidence of renal artery occlusive disease in either renal artery. Normal intrarenal vascular perfusion is noted in both kidneys. Mild increase in the echogenicity of both kidneys suggest medico-renal disease. Normal sized kidneys. Compared to the study done on 01/09/14, right renal artery stenosis not evident in the present study.  Renal Dopplers [01/09/2014]: Renal artery duplex: Hemodynamically significant stenosis of the right renal artery. Normal intrarenal vascular perfusion is noted in both kidneys. < 50% stenosis of the left renal artery ostium. Bilateral renal size is normal.  Assessment   Essential hypertension - Plan: labetalol (NORMODYNE) 100 MG tablet,    Atypical chest pain    Dyspnea on exertion   EKG 07/12/2018: Normal sinus rhythm at rate of 71 bpm, left atrial enlargement, normal axis.  Right bundle branch block.  No evidence of  ischemia. No significant change from  EKG 05/08/2017  Recommendations:    Ms. Julia Gates is a pleasant Afro-American female with difficult control hypertension, diet-controlled diabetes, chronic shortness breath and hypertension with hypertensive heart.  She does not want to be on statins, she is also very particular about the medications.  I reviewed the results of the  echocardiogram, she is hypertensive heart disease.  She has noticed her blood pressure to be high, advised her we should certainly try labetalol 100 mg p.o. b.i.d. and she can continue to monitor the blood pressure at regular basis, she has noticed her blood pressure to be high in 140s to 160 range most times although it was normal today.  She has not had any further chest pain.  I simply reassured her.  At most the chest pain appears atypical to GERD-like symptoms.  I'll see her back in 6 months.  Adrian Prows, MD, [facility] 08/23/2018, 9:25 AM [facility]. [place] Pager: [phone] Office: [phone] If no answer Cell [phone]

## 2018-10-17 ENCOUNTER — Telehealth: Payer: Self-pay | Admitting: Nurse Practitioner

## 2018-10-17 NOTE — Telephone Encounter (Signed)
I left a message asking the pt to call me at [phone] to schedule AWV with Nickeah. VDM (DD) °

## 2018-11-01 LAB — HM DIABETES EYE EXAM

## 2018-11-19 ENCOUNTER — Encounter: Payer: Self-pay | Admitting: Nurse Practitioner

## 2018-12-06 HISTORY — PX: CATARACT EXTRACTION: SUR2

## 2019-01-03 ENCOUNTER — Ambulatory Visit: Payer: Medicare Other | Admitting: Nurse Practitioner

## 2019-01-03 ENCOUNTER — Ambulatory Visit: Payer: Medicare Other

## 2019-01-06 HISTORY — PX: CATARACT EXTRACTION, BILATERAL: SHX1313

## 2019-01-08 ENCOUNTER — Ambulatory Visit (INDEPENDENT_AMBULATORY_CARE_PROVIDER_SITE_OTHER): Payer: Medicare Other | Admitting: Nurse Practitioner

## 2019-01-08 ENCOUNTER — Ambulatory Visit (INDEPENDENT_AMBULATORY_CARE_PROVIDER_SITE_OTHER): Payer: Medicare Other

## 2019-01-08 ENCOUNTER — Encounter: Payer: Self-pay | Admitting: Nurse Practitioner

## 2019-01-08 ENCOUNTER — Other Ambulatory Visit: Payer: Self-pay

## 2019-01-08 VITALS — BP 130/82 | HR 74 | Temp 98.7°F | Ht 64.0 in | Wt 164.6 lb

## 2019-01-08 VITALS — BP 130/82 | HR 74 | Temp 98.7°F | Ht 64.0 in | Wt 164.0 lb

## 2019-01-08 DIAGNOSIS — Z0000 Encounter for general adult medical examination without abnormal findings: Secondary | ICD-10-CM

## 2019-01-08 DIAGNOSIS — I10 Essential (primary) hypertension: Secondary | ICD-10-CM

## 2019-01-08 DIAGNOSIS — M85851 Other specified disorders of bone density and structure, right thigh: Secondary | ICD-10-CM | POA: Diagnosis not present

## 2019-01-08 DIAGNOSIS — Z1231 Encounter for screening mammogram for malignant neoplasm of breast: Secondary | ICD-10-CM | POA: Diagnosis not present

## 2019-01-08 DIAGNOSIS — M25512 Pain in left shoulder: Secondary | ICD-10-CM

## 2019-01-08 DIAGNOSIS — G8929 Other chronic pain: Secondary | ICD-10-CM

## 2019-01-08 DIAGNOSIS — R7303 Prediabetes: Secondary | ICD-10-CM

## 2019-01-08 LAB — POCT URINALYSIS DIPSTICK
Bilirubin, UA: NEGATIVE
Blood, UA: NEGATIVE
Glucose, UA: NEGATIVE
Ketones, UA: NEGATIVE
Leukocytes, UA: NEGATIVE
Nitrite, UA: NEGATIVE
Protein, UA: NEGATIVE
Spec Grav, UA: 1.025 (ref 1.010–1.025)
Urobilinogen, UA: 0.2 E.U./dL
pH, UA: 6 (ref 5.0–8.0)

## 2019-01-08 LAB — POCT UA - MICROALBUMIN
Albumin/Creatinine Ratio, Urine, POC: 30
Creatinine, POC: 100 mg/dL
Microalbumin Ur, POC: 10 mg/L

## 2019-01-08 MED ORDER — SPIRONOLACTONE 25 MG PO TABS: 25.0000 mg | ORAL_TABLET | Freq: Every day | ORAL | 1 refills | Status: DC

## 2019-01-08 MED ORDER — VALSARTAN-HYDROCHLOROTHIAZIDE 320-25 MG PO TABS: 1.0000 | ORAL_TABLET | Freq: Every day | ORAL | 1 refills | Status: DC

## 2019-01-08 NOTE — Progress Notes (Signed)
Subjective:     Patient ID: Julia Gates , female    DOB: Jun 07, 1945 , 73 y.o.   MRN: [id number]   Chief Complaint  Patient presents with  . Annual Exam    HPI  Here for HM  Denies history seeing an orthopedic in the past  Hypertension This is a chronic problem. The current episode started more than 1 year ago. The problem is unchanged. The problem is controlled. Pertinent negatives include no anxiety, chest pain or palpitations. There are no associated agents to hypertension. There are no known risk factors for coronary artery disease. Past treatments include angiotensin blockers and diuretics. There are no compliance problems.  There is no history of angina. There is no history of chronic renal disease.  Shoulder Pain  The pain is present in the left shoulder. This is a chronic problem. The current episode started more than 1 month ago. The problem occurs constantly. The problem has been gradually worsening. The quality of the pain is described as aching and dull. Pertinent negatives include no fever. The symptoms are aggravated by activity and lying down. She has tried OTC pain meds for the symptoms. The treatment provided mild relief. Family history does not include gout or rheumatoid arthritis. There is no history of osteoarthritis.    The patient states she uses post menopausal status for birth control. Mammogram last done 02/08/2017.  Negative for: breast discharge, breast lump(s), breast pain and breast self exam.  Pertinent negatives include abnormal bleeding (hematology), anxiety, decreased libido, depression, difficulty falling sleep, dyspareunia, history of infertility, nocturia, sexual dysfunction, sleep disturbances, urinary incontinence, urinary urgency, vaginal discharge and vaginal itching. Diet regular, eats mostly fish. The patient states her exercise level is minimal.     The patient's tobacco use is:  Social History   Tobacco Use  Smoking Status Never Smoker   Smokeless Tobacco Never Used   She has been exposed to passive smoke. The patient's alcohol use is:  Social History   Substance and Sexual Activity  Alcohol Use No     Past Medical History:  Diagnosis Date  . Abnormal glucose level   . High cholesterol   . Hypertension   . Lumbago   . Sciatica   . Vitamin D deficiency      Family History  Problem Relation Age of Onset  . Heart disease Mother   . Diabetes Mother   . Diabetes Brother   . Diabetes Sister      Current Outpatient Medications:  .  Ascorbic Acid (VITAMIN C) 100 MG tablet, Take 100 mg by mouth as needed., Disp: , Rfl:  .  cholecalciferol (VITAMIN D3) 25 MCG (1000 UT) tablet, Take 1,000 Units by mouth daily., Disp: , Rfl:  .  co-enzyme Q-10 30 MG capsule, Take 30 mg by mouth 3 (three) times daily., Disp: , Rfl:  .  labetalol (NORMODYNE) 100 MG tablet, Take 1 tablet (100 mg total) by mouth 2 (two) times daily. (Patient not taking: Reported on 01/08/2019), Disp: 60 tablet, Rfl: 1 .  Multiple Vitamin (MULTIVITAMIN) tablet, Take 1 tablet by mouth daily., Disp: , Rfl:  .  NON FORMULARY, Green protein drink, Disp: , Rfl:  .  NON FORMULARY, Black seed drink, Disp: , Rfl:  .  Omega-3-6-9 CAPS, Take by mouth., Disp: , Rfl:  .  spironolactone (ALDACTONE) 25 MG tablet, Take 1 tablet (25 mg total) by mouth daily., Disp: 15 tablet, Rfl: 1 .  valsartan-hydrochlorothiazide (DIOVAN-HCT) 320-25 MG tablet, Take 1 tablet by  mouth daily., Disp: 15 tablet, Rfl: 1   Allergies  Allergen Reactions  . Penicillins Rash     Review of Systems  Constitutional: Negative.  Negative for fever.  HENT: Negative.   Eyes: Negative.   Respiratory: Negative.   Cardiovascular: Negative.  Negative for chest pain, palpitations and leg swelling.  Gastrointestinal: Negative.   Endocrine: Negative.   Genitourinary: Negative.   Musculoskeletal:       Left shoulder pain  Skin: Negative.   Allergic/Immunologic: Negative.   Neurological: Negative.    Hematological: Negative.   Psychiatric/Behavioral: Negative.      Today's Vitals   01/08/19 0950  BP: 130/82  Pulse: 74  Temp: 98.7 F (37.1 C)  TempSrc: Oral  Weight: 164 lb (74.4 kg)  Height: 5' 4"  (1.626 m)  PainSc: 4    Body mass index is 28.15 kg/m.   Objective:  Physical Exam Vitals signs reviewed.  Constitutional:      Appearance: Normal appearance. She is well-developed.  HENT:     Head: Normocephalic and atraumatic.     Right Ear: Hearing, tympanic membrane, ear canal and external ear normal.     Left Ear: Hearing, tympanic membrane, ear canal and external ear normal.     Nose: Nose normal.     Mouth/Throat:     Mouth: Mucous membranes are moist.  Eyes:     General: Lids are normal.     Conjunctiva/sclera: Conjunctivae normal.     Pupils: Pupils are equal, round, and reactive to light.     Funduscopic exam:    Right eye: No papilledema.        Left eye: No papilledema.  Neck:     Musculoskeletal: Full passive range of motion without pain, normal range of motion and neck supple.     Thyroid: No thyroid mass.     Vascular: No carotid bruit.  Cardiovascular:     Rate and Rhythm: Normal rate and regular rhythm.     Pulses: Normal pulses.     Heart sounds: Normal heart sounds. No murmur.  Pulmonary:     Effort: Pulmonary effort is normal.     Breath sounds: Normal breath sounds.  Abdominal:     General: Abdomen is flat. Bowel sounds are normal.     Palpations: Abdomen is soft.  Musculoskeletal: Normal range of motion.        General: No swelling or tenderness (with range of motion of left shoulder has mild discomfort).     Right lower leg: No edema.     Left lower leg: No edema.  Skin:    General: Skin is warm and dry.     Capillary Refill: Capillary refill takes less than 2 seconds.  Neurological:     General: No focal deficit present.     Mental Status: She is alert and oriented to person, place, and time.     Cranial Nerves: No cranial nerve  deficit.     Sensory: No sensory deficit.  Psychiatric:        Mood and Affect: Mood normal.        Behavior: Behavior normal.        Thought Content: Thought content normal.        Judgment: Judgment normal.         Assessment And Plan:     1. Health maintenance examination . Behavior modifications discussed and diet history reviewed.   . Pt will continue to exercise regularly and modify diet with low GI,  plant based foods and decrease intake of processed foods.  . Recommend intake of daily multivitamin, Vitamin D, and calcium.  . Recommend mammogram and colonoscopy (up to date) for preventive screenings, as well as recommend immunizations that include influenza (declined), TDAP  2. Encounter for screening mammogram for breast cancer  Pt instructed on Self Breast Exam.According to ACOG guidelines Women aged 78 and older are recommended to get an annual mammogram. Form completed and given to patient contact the [facility] for appointment scheduing.   Pt encouraged to get annual mammogram - MM DIGITAL SCREENING BILATERAL; Future  3. Osteopenia of neck of right femur  Will order bone density  Encouraged to take vitamin d at least 2,000 units daily - DG Bone Density; Future - VITAMIN D 25 Hydroxy (Vit-D Deficiency, Fractures)  4. Essential hypertension . B/P is controlled.  . CMP ordered to check renal function.  . The importance of regular exercise and dietary modification was stressed to the patient.  - POCT Urinalysis Dipstick (81002) - POCT UA - Microalbumin - CMP14+EGFR - Lipid panel - CBC  5. Prediabetes  Chronic, controlled  No current medications  Encouraged to limit intake of sugary foods and drinks  Encouraged to increase physical activity to 150 minutes per week as tolerated - Hemoglobin A1c  6. Chronic left shoulder pain  Mild tenderness with range of motion   Bursitis vs osteoarthritis vs inflammatory process  At this time she would like  to monitor and hold off on an xray    Minette Brine, FNP    THE PATIENT IS ENCOURAGED TO PRACTICE SOCIAL DISTANCING DUE TO THE COVID-19 PANDEMIC.

## 2019-01-08 NOTE — Patient Instructions (Signed)
Health Maintenance, Female Adopting a healthy lifestyle and getting preventive care are important in promoting health and wellness. Ask your health care provider about:  The right schedule for you to have regular tests and exams.  Things you can do on your own to prevent diseases and keep yourself healthy. What should I know about diet, weight, and exercise? Eat a healthy diet   Eat a diet that includes plenty of vegetables, fruits, low-fat dairy products, and lean protein.  Do not eat a lot of foods that are high in solid fats, added sugars, or sodium. Maintain a healthy weight Body mass index (BMI) is used to identify weight problems. It estimates body fat based on height and weight. Your health care provider can help determine your BMI and help you achieve or maintain a healthy weight. Get regular exercise Get regular exercise. This is one of the most important things you can do for your health. Most adults should:  Exercise for at least 150 minutes each week. The exercise should increase your heart rate and make you sweat (moderate-intensity exercise).  Do strengthening exercises at least twice a week. This is in addition to the moderate-intensity exercise.  Spend less time sitting. Even light physical activity can be beneficial. Watch cholesterol and blood lipids Have your blood tested for lipids and cholesterol at 72 years of age, then have this test every 5 years. Have your cholesterol levels checked more often if:  Your lipid or cholesterol levels are high.  You are older than 72 years of age.  You are at high risk for heart disease. What should I know about cancer screening? Depending on your health history and family history, you may need to have cancer screening at various ages. This may include screening for:  Breast cancer.  Cervical cancer.  Colorectal cancer.  Skin cancer.  Lung cancer. What should I know about heart disease, diabetes, and high blood  pressure? Blood pressure and heart disease  High blood pressure causes heart disease and increases the risk of stroke. This is more likely to develop in people who have high blood pressure readings, are of African descent, or are overweight.  Have your blood pressure checked: ? Every 3-5 years if you are 18-39 years of age. ? Every year if you are 40 years old or older. Diabetes Have regular diabetes screenings. This checks your fasting blood sugar level. Have the screening done:  Once every three years after age 40 if you are at a normal weight and have a low risk for diabetes.  More often and at a younger age if you are overweight or have a high risk for diabetes. What should I know about preventing infection? Hepatitis B If you have a higher risk for hepatitis B, you should be screened for this virus. Talk with your health care provider to find out if you are at risk for hepatitis B infection. Hepatitis C Testing is recommended for:  Everyone born from 1945 through 1965.  Anyone with known risk factors for hepatitis C. Sexually transmitted infections (STIs)  Get screened for STIs, including gonorrhea and chlamydia, if: ? You are sexually active and are younger than 72 years of age. ? You are older than 72 years of age and your health care provider tells you that you are at risk for this type of infection. ? Your sexual activity has changed since you were last screened, and you are at increased risk for chlamydia or gonorrhea. Ask your health care provider if   you are at risk.  Ask your health care provider about whether you are at high risk for HIV. Your health care provider may recommend a prescription medicine to help prevent HIV infection. If you choose to take medicine to prevent HIV, you should first get tested for HIV. You should then be tested every 3 months for as long as you are taking the medicine. Pregnancy  If you are about to stop having your period (premenopausal) and  you may become pregnant, seek counseling before you get pregnant.  Take 400 to 800 micrograms (mcg) of folic acid every day if you become pregnant.  Ask for birth control (contraception) if you want to prevent pregnancy. Osteoporosis and menopause Osteoporosis is a disease in which the bones lose minerals and strength with aging. This can result in bone fractures. If you are 63 years old or older, or if you are at risk for osteoporosis and fractures, ask your health care provider if you should:  Be screened for bone loss.  Take a calcium or vitamin D supplement to lower your risk of fractures.  Be given hormone replacement therapy (HRT) to treat symptoms of menopause. Follow these instructions at home: Lifestyle  Do not use any products that contain nicotine or tobacco, such as cigarettes, e-cigarettes, and chewing tobacco. If you need help quitting, ask your health care provider.  Do not use street drugs.  Do not share needles.  Ask your health care provider for help if you need support or information about quitting drugs. Alcohol use  Do not drink alcohol if: ? Your health care provider tells you not to drink. ? You are pregnant, may be pregnant, or are planning to become pregnant.  If you drink alcohol: ? Limit how much you use to 0-1 drink a day. ? Limit intake if you are breastfeeding.  Be aware of how much alcohol is in your drink. In the [place] one drink equals one 12 oz bottle of beer (355 mL), one 5 oz glass of wine (148 mL), or one 1 oz glass of hard liquor (44 mL). General instructions  Schedule regular health, dental, and eye exams.  Stay current with your vaccines.  Tell your health care provider if: ? You often feel depressed. ? You have ever been abused or do not feel safe at home. Summary  Adopting a healthy lifestyle and getting preventive care are important in promoting health and wellness.  Follow your health care provider's instructions about healthy  diet, exercising, and getting tested or screened for diseases.  Follow your health care provider's instructions on monitoring your cholesterol and blood pressure. This information is not intended to replace advice given to you by your health care provider. Make sure you discuss any questions you have with your health care provider. Document Released: 09/06/2010 Document Revised: 02/14/2018 Document Reviewed: 02/14/2018 Elsevier Patient Education  2020 [employer].   Take a hair, skin and nail vitamin supplement.

## 2019-01-08 NOTE — Progress Notes (Signed)
Subjective:   Julia Gates is a 73 y.o. female who presents for Medicare Annual (Subsequent) preventive examination.  Review of Systems:  n/a Cardiac Risk Factors include: advanced age (>12men, >63 women);hypertension     Objective:     Vitals: BP 130/82 (BP Location: Left Arm, Patient Position: Sitting, Cuff Size: Normal)   Pulse 74   Temp 98.7 F (37.1 C) (Oral)   Ht 5\' 4"  (1.626 m)   Wt 164 lb 9.6 oz (74.7 kg)   SpO2 97%   BMI 28.25 kg/m   Body mass index is 28.25 kg/m.  Advanced Directives 01/08/2019 12/28/2017  Does Patient Have a Medical Advance Directive? No No  Would patient like information on creating a medical advance directive? - Yes (MAU/Ambulatory/Procedural Areas - Information given)    Tobacco Social History   Tobacco Use  Smoking Status Never Smoker  Smokeless Tobacco Never Used     Counseling given: Not Answered   Clinical Intake:  Pre-visit preparation completed: Yes  Pain : 0-10 Pain Score: 4  Pain Type: Chronic pain Pain Location: Shoulder Pain Orientation: Left Pain Radiating Towards: none Pain Descriptors / Indicators: Aching Pain Onset: More than a month ago Pain Frequency: Constant Pain Relieving Factors: salve  Pain Relieving Factors: salve  Nutritional Status: BMI 25 -29 Overweight Nutritional Risks: None Diabetes: No  How often do you need to have someone help you when you read instructions, pamphlets, or other written materials from your doctor or pharmacy?: 1 - Never What is the last grade level you completed in school?: 12th grade  Interpreter Needed?: No  Information entered by :: NAllen LPN  Past Medical History:  Diagnosis Date  . Abnormal glucose level   . High cholesterol   . Hypertension   . Lumbago   . Sciatica   . Vitamin D deficiency    Past Surgical History:  Procedure Laterality Date  . CATARACT EXTRACTION Right 12/2018  . TUBAL LIGATION     age 29   Family History  Problem Relation Age of  Onset  . Heart disease Mother   . Diabetes Mother   . Diabetes Brother   . Diabetes Sister    Social History   Socioeconomic History  . Marital status: Married    Spouse name: Not on file  . Number of children: 3  . Years of education: Not on file  . Highest education level: Not on file  Occupational History  . Occupation: retired  Scientific laboratory technician  . Financial resource strain: Not hard at all  . Food insecurity    Worry: Never true    Inability: Never true  . Transportation needs    Medical: No    Non-medical: No  Tobacco Use  . Smoking status: Never Smoker  . Smokeless tobacco: Never Used  Substance and Sexual Activity  . Alcohol use: No  . Drug use: No  . Sexual activity: Yes  Lifestyle  . Physical activity    Days per week: 1 day    Minutes per session: 30 min  . Stress: Not at all  Relationships  . Social Herbalist on phone: Not on file    Gets together: Not on file    Attends religious service: Not on file    Active member of club or organization: Not on file    Attends meetings of clubs or organizations: Not on file    Relationship status: Not on file  Other Topics Concern  . Not on  file  Social History Narrative   Patient lives at home with her husband and she is retired. She has a high school education and 3 children    Outpatient Encounter Medications as of 01/08/2019  Medication Sig  . Ascorbic Acid (VITAMIN C) 100 MG tablet Take 100 mg by mouth as needed.  . cholecalciferol (VITAMIN D3) 25 MCG (1000 UT) tablet Take 1,000 Units by mouth daily.  Marland Kitchen co-enzyme Q-10 30 MG capsule Take 30 mg by mouth 3 (three) times daily.  . Multiple Vitamin (MULTIVITAMIN) tablet Take 1 tablet by mouth daily.  . NON FORMULARY Green protein drink  . NON FORMULARY Black seed drink  . Omega-3-6-9 CAPS Take by mouth.  . [DISCONTINUED] spironolactone (ALDACTONE) 25 MG tablet Take 1 tablet (25 mg total) by mouth daily.  . [DISCONTINUED] valsartan-hydrochlorothiazide  (DIOVAN-HCT) 320-25 MG tablet Take 1 tablet by mouth daily.  Marland Kitchen labetalol (NORMODYNE) 100 MG tablet Take 1 tablet (100 mg total) by mouth 2 (two) times daily. (Patient not taking: Reported on 01/08/2019)   No facility-administered encounter medications on file as of 01/08/2019.     Activities of Daily Living In your present state of health, do you have any difficulty performing the following activities: 01/08/2019  Hearing? N  Vision? Y  Comment due to cataract  Difficulty concentrating or making decisions? N  Walking or climbing stairs? Y  Comment gets tired  Dressing or bathing? N  Doing errands, shopping? N  Preparing Food and eating ? N  Using the Toilet? N  In the past six months, have you accidently leaked urine? Y  Comment if does not get to bathroom quick enough  Do you have problems with loss of bowel control? N  Managing your Medications? N  Managing your Finances? N  Housekeeping or managing your Housekeeping? N  Some recent data might be hidden    Patient Care Team: Minette Brine, FNP as PCP - General (General Practice)    Assessment:   This is a routine wellness examination for Julia Gates.  Exercise Activities and Dietary recommendations Current Exercise Habits: Home exercise routine, Type of exercise: calisthenics, Time (Minutes): 30, Frequency (Times/Week): 1, Weekly Exercise (Minutes/Week): 30  Goals    . Exercise 150 min/wk Moderate Activity (pt-stated)       Fall Risk Fall Risk  01/08/2019 07/05/2018 04/04/2018 12/28/2017  Falls in the past year? 0 0 0 No  Number falls in past yr: 0 - - -  Risk for fall due to : Medication side effect - - Medication side effect  Follow up Falls evaluation completed;Education provided;Falls prevention discussed - - -   Is the patient's home free of loose throw rugs in walkways, pet beds, electrical cords, etc?   yes      Grab bars in the bathroom? yes      Handrails on the stairs?   yes      Adequate lighting?   yes  Timed  Get Up and Go performed: n/a  Depression Screen PHQ 2/9 Scores 01/08/2019 07/05/2018 04/04/2018 12/28/2017  PHQ - 2 Score 3 0 0 0  PHQ- 9 Score 3 - - 1     Cognitive Function     6CIT Screen 01/08/2019 12/28/2017  What Year? 0 points 0 points  What month? 0 points 0 points  What time? 0 points 0 points  Count back from 20 0 points 0 points  Months in reverse 0 points 0 points  Repeat phrase 0 points 0 points  Total Score  0 0     There is no immunization history on file for this patient.  Qualifies for Shingles Vaccine? yes  Screening Tests Health Maintenance  Topic Date Due  . Hepatitis C Screening  02/05/1946  . COLONOSCOPY  11/14/1996  . INFLUENZA VACCINE  06/05/2019 (Originally 10/06/2018)  . TETANUS/TDAP  01/08/2020 (Originally 11/14/1965)  . PNA vac Low Risk Adult (1 of 2 - PCV13) 01/08/2020 (Originally 11/15/2011)  . MAMMOGRAM  02/09/2019  . DEXA SCAN  Completed    Cancer Screenings: Lung: Low Dose CT Chest recommended if Age 11-80 years, 30 pack-year currently smoking OR have quit w/in 15years. Patient does not qualify. Breast:  Up to date on Mammogram? Yes   Up to date of Bone Density/Dexa? Yes Colorectal: requesting report  Additional Screenings: : Hepatitis C Screening: due     Plan:    Patient would like to continue last years goal of exercising regularly.   I have personally reviewed and noted the following in the patient's chart:   . Medical and social history . Use of alcohol, tobacco or illicit drugs  . Current medications and supplements . Functional ability and status . Nutritional status . Physical activity . Advanced directives . List of other physicians . Hospitalizations, surgeries, and ER visits in previous 12 months . Vitals . Screenings to include cognitive, depression, and falls . Referrals and appointments  In addition, I have reviewed and discussed with patient certain preventive protocols, quality metrics, and best practice  recommendations. A written personalized care plan for preventive services as well as general preventive health recommendations were provided to patient.     Kellie Simmering, LPN  [id number]

## 2019-01-08 NOTE — Patient Instructions (Signed)
Julia Gates , Thank you for taking time to come for your Medicare Wellness Visit. I appreciate your ongoing commitment to your health goals. Please review the following plan we discussed and let me know if I can assist you in the future.   Screening recommendations/referrals: Colonoscopy: requesting report Mammogram: 02/2017 Bone Density: 02/2017 Recommended yearly ophthalmology/optometry visit for glaucoma screening and checkup Recommended yearly dental visit for hygiene and checkup  Vaccinations: Influenza vaccine: decline  Pneumococcal vaccine: decline Tdap vaccine: decline Shingles vaccine: discussed    Advanced directives: Advance directive discussed with you today. Even though you declined this today please call our office should you change your mind and we can give you the proper paperwork for you to fill out.   Conditions/risks identified: overweight  Next appointment: 07/09/2019 at 9:00   Preventive Care 72 Years and Older, Female Preventive care refers to lifestyle choices and visits with your health care provider that can promote health and wellness. What does preventive care include?  A yearly physical exam. This is also called an annual well check.  Dental exams once or twice a year.  Routine eye exams. Ask your health care provider how often you should have your eyes checked.  Personal lifestyle choices, including:  Daily care of your teeth and gums.  Regular physical activity.  Eating a healthy diet.  Avoiding tobacco and drug use.  Limiting alcohol use.  Practicing safe sex.  Taking low-dose aspirin every day.  Taking vitamin and mineral supplements as recommended by your health care provider. What happens during an annual well check? The services and screenings done by your health care provider during your annual well check will depend on your age, overall health, lifestyle risk factors, and family history of disease. Counseling  Your health care  provider may ask you questions about your:  Alcohol use.  Tobacco use.  Drug use.  Emotional well-being.  Home and relationship well-being.  Sexual activity.  Eating habits.  History of falls.  Memory and ability to understand (cognition).  Work and work Statistician.  Reproductive health. Screening  You may have the following tests or measurements:  Height, weight, and BMI.  Blood pressure.  Lipid and cholesterol levels. These may be checked every 5 years, or more frequently if you are over 87 years old.  Skin check.  Lung cancer screening. You may have this screening every year starting at age 50 if you have a 30-pack-year history of smoking and currently smoke or have quit within the past 15 years.  Fecal occult blood test (FOBT) of the stool. You may have this test every year starting at age 47.  Flexible sigmoidoscopy or colonoscopy. You may have a sigmoidoscopy every 5 years or a colonoscopy every 10 years starting at age 62.  Hepatitis C blood test.  Hepatitis B blood test.  Sexually transmitted disease (STD) testing.  Diabetes screening. This is done by checking your blood sugar (glucose) after you have not eaten for a while (fasting). You may have this done every 1-3 years.  Bone density scan. This is done to screen for osteoporosis. You may have this done starting at age 57.  Mammogram. This may be done every 1-2 years. Talk to your health care provider about how often you should have regular mammograms. Talk with your health care provider about your test results, treatment options, and if necessary, the need for more tests. Vaccines  Your health care provider may recommend certain vaccines, such as:  Influenza vaccine. This is  recommended every year.  Tetanus, diphtheria, and acellular pertussis (Tdap, Td) vaccine. You may need a Td booster every 10 years.  Zoster vaccine. You may need this after age 61.  Pneumococcal 13-valent conjugate (PCV13)  vaccine. One dose is recommended after age 37.  Pneumococcal polysaccharide (PPSV23) vaccine. One dose is recommended after age 42. Talk to your health care provider about which screenings and vaccines you need and how often you need them. This information is not intended to replace advice given to you by your health care provider. Make sure you discuss any questions you have with your health care provider. Document Released: 03/20/2015 Document Revised: 11/11/2015 Document Reviewed: 12/23/2014 Elsevier Interactive Patient Education  2017 [place] Prevention in the Home Falls can cause injuries. They can happen to people of all ages. There are many things you can do to make your home safe and to help prevent falls. What can I do on the outside of my home?  Regularly fix the edges of walkways and driveways and fix any cracks.  Remove anything that might make you trip as you walk through a door, such as a raised step or threshold.  Trim any bushes or trees on the path to your home.  Use bright outdoor lighting.  Clear any walking paths of anything that might make someone trip, such as rocks or tools.  Regularly check to see if handrails are loose or broken. Make sure that both sides of any steps have handrails.  Any raised decks and porches should have guardrails on the edges.  Have any leaves, snow, or ice cleared regularly.  Use sand or salt on walking paths during winter.  Clean up any spills in your garage right away. This includes oil or grease spills. What can I do in the bathroom?  Use night lights.  Install grab bars by the toilet and in the tub and shower. Do not use towel bars as grab bars.  Use non-skid mats or decals in the tub or shower.  If you need to sit down in the shower, use a plastic, non-slip stool.  Keep the floor dry. Clean up any water that spills on the floor as soon as it happens.  Remove soap buildup in the tub or shower regularly.   Attach bath mats securely with double-sided non-slip rug tape.  Do not have throw rugs and other things on the floor that can make you trip. What can I do in the bedroom?  Use night lights.  Make sure that you have a light by your bed that is easy to reach.  Do not use any sheets or blankets that are too big for your bed. They should not hang down onto the floor.  Have a firm chair that has side arms. You can use this for support while you get dressed.  Do not have throw rugs and other things on the floor that can make you trip. What can I do in the kitchen?  Clean up any spills right away.  Avoid walking on wet floors.  Keep items that you use a lot in easy-to-reach places.  If you need to reach something above you, use a strong step stool that has a grab bar.  Keep electrical cords out of the way.  Do not use floor polish or wax that makes floors slippery. If you must use wax, use non-skid floor wax.  Do not have throw rugs and other things on the floor that can make you trip.  What can I do with my stairs?  Do not leave any items on the stairs.  Make sure that there are handrails on both sides of the stairs and use them. Fix handrails that are broken or loose. Make sure that handrails are as long as the stairways.  Check any carpeting to make sure that it is firmly attached to the stairs. Fix any carpet that is loose or worn.  Avoid having throw rugs at the top or bottom of the stairs. If you do have throw rugs, attach them to the floor with carpet tape.  Make sure that you have a light switch at the top of the stairs and the bottom of the stairs. If you do not have them, ask someone to add them for you. What else can I do to help prevent falls?  Wear shoes that:  Do not have high heels.  Have rubber bottoms.  Are comfortable and fit you well.  Are closed at the toe. Do not wear sandals.  If you use a stepladder:  Make sure that it is fully opened. Do not climb  a closed stepladder.  Make sure that both sides of the stepladder are locked into place.  Ask someone to hold it for you, if possible.  Clearly mark and make sure that you can see:  Any grab bars or handrails.  First and last steps.  Where the edge of each step is.  Use tools that help you move around (mobility aids) if they are needed. These include:  Canes.  Walkers.  Scooters.  Crutches.  Turn on the lights when you go into a dark area. Replace any light bulbs as soon as they burn out.  Set up your furniture so you have a clear path. Avoid moving your furniture around.  If any of your floors are uneven, fix them.  If there are any pets around you, be aware of where they are.  Review your medicines with your doctor. Some medicines can make you feel dizzy. This can increase your chance of falling. Ask your doctor what other things that you can do to help prevent falls. This information is not intended to replace advice given to you by your health care provider. Make sure you discuss any questions you have with your health care provider. Document Released: 12/18/2008 Document Revised: 07/30/2015 Document Reviewed: 03/28/2014 Elsevier Interactive Patient Education  2017 [employer].

## 2019-01-09 LAB — LIPID PANEL
Chol/HDL Ratio: 3.4 ratio (ref 0.0–4.4)
Cholesterol, Total: 181 mg/dL (ref 100–199)
HDL: 54 mg/dL (ref >39)
LDL Chol Calc (NIH): 117 mg/dL — ABNORMAL HIGH (ref 0–99)
Triglycerides: 53 mg/dL (ref 0–149)
VLDL Cholesterol Cal: 10 mg/dL (ref 5–40)

## 2019-01-09 LAB — CMP14+EGFR
ALT: 19 IU/L (ref 0–32)
AST: 23 IU/L (ref 0–40)
Albumin/Globulin Ratio: 1.8 (ref 1.2–2.2)
Albumin: 4.8 g/dL — ABNORMAL HIGH (ref 3.7–4.7)
Alkaline Phosphatase: 64 IU/L (ref 39–117)
BUN/Creatinine Ratio: 17 (ref 12–28)
BUN: 18 mg/dL (ref 8–27)
Bilirubin Total: 0.5 mg/dL (ref 0.0–1.2)
CO2: 26 mmol/L (ref 20–29)
Calcium: 9.9 mg/dL (ref 8.7–10.3)
Chloride: 100 mmol/L (ref 96–106)
Creatinine, Ser: 1.07 mg/dL — ABNORMAL HIGH (ref 0.57–1.00)
GFR calc Af Amer: 60 mL/min/{1.73_m2} (ref >59)
GFR calc non Af Amer: 52 mL/min/{1.73_m2} — ABNORMAL LOW (ref >59)
Globulin, Total: 2.7 g/dL (ref 1.5–4.5)
Glucose: 105 mg/dL — ABNORMAL HIGH (ref 65–99)
Potassium: 4.2 mmol/L (ref 3.5–5.2)
Sodium: 139 mmol/L (ref 134–144)
Total Protein: 7.5 g/dL (ref 6.0–8.5)

## 2019-01-09 LAB — CBC
Hematocrit: 40.4 % (ref 34.0–46.6)
Hemoglobin: 13.6 g/dL (ref 11.1–15.9)
MCH: 30.4 pg (ref 26.6–33.0)
MCHC: 33.7 g/dL (ref 31.5–35.7)
MCV: 90 fL (ref 79–97)
Platelets: 207 10*3/uL (ref 150–450)
RBC: 4.48 x10E6/uL (ref 3.77–5.28)
RDW: 12.9 % (ref 11.7–15.4)
WBC: 5.5 10*3/uL (ref 3.4–10.8)

## 2019-01-09 LAB — VITAMIN D 25 HYDROXY (VIT D DEFICIENCY, FRACTURES): Vit D, 25-Hydroxy: 32.7 ng/mL (ref 30.0–100.0)

## 2019-01-09 LAB — HEMOGLOBIN A1C
Est. average glucose Bld gHb Est-mCnc: 134 mg/dL
Hgb A1c MFr Bld: 6.3 % — ABNORMAL HIGH (ref 4.8–5.6)

## 2019-01-14 ENCOUNTER — Encounter: Payer: Self-pay | Admitting: Nurse Practitioner

## 2019-02-08 ENCOUNTER — Encounter: Payer: Self-pay | Admitting: Nurse Practitioner

## 2019-02-20 ENCOUNTER — Encounter: Payer: Self-pay | Admitting: Cardiology

## 2019-02-21 ENCOUNTER — Ambulatory Visit (INDEPENDENT_AMBULATORY_CARE_PROVIDER_SITE_OTHER): Payer: Medicare Other | Admitting: Cardiology

## 2019-02-21 ENCOUNTER — Encounter: Payer: Self-pay | Admitting: Cardiology

## 2019-02-21 ENCOUNTER — Other Ambulatory Visit: Payer: Self-pay

## 2019-02-21 VITALS — BP 160/80 | HR 80 | Ht 66.0 in | Wt 165.0 lb

## 2019-02-21 DIAGNOSIS — E7800 Pure hypercholesterolemia, unspecified: Secondary | ICD-10-CM | POA: Diagnosis not present

## 2019-02-21 DIAGNOSIS — I10 Essential (primary) hypertension: Secondary | ICD-10-CM

## 2019-02-21 DIAGNOSIS — R7303 Prediabetes: Secondary | ICD-10-CM | POA: Diagnosis not present

## 2019-02-21 DIAGNOSIS — R0600 Dyspnea, unspecified: Secondary | ICD-10-CM

## 2019-02-21 DIAGNOSIS — R0609 Other forms of dyspnea: Secondary | ICD-10-CM

## 2019-02-21 MED ORDER — VERAPAMIL HCL ER 120 MG PO TBCR: 120.0000 mg | EXTENDED_RELEASE_TABLET | Freq: Every day | ORAL | 2 refills | Status: DC

## 2019-02-21 NOTE — Progress Notes (Signed)
Primary Physician/Referring:  Minette Brine, FNP  Patient ID: Zada Girt, female    DOB: 01-Jan-1946, 73 y.o.   MRN: [id number]  Chief Complaint  Patient presents with  . Hypertension  . Shortness of Breath    HPI: Noelly Holloway  is a 73 y.o. female  with difficult control hypertension, diet-controlled diabetes, chronic shortness breath and hypertension with hypertensive heart disease, is very particular with need to taking her medications, does not want to be on statins.   She is here on a six-month office visit and follow-up of chest pain and also hypertension.  She has not had any recurrence of chest pain.  She has not had any dyspnea.  States that the blood pressure is relatively well controlled but has noticed elevated blood pressure at home as well.  No dizziness or syncope.  Past Medical History:  Diagnosis Date  . Abnormal glucose level   . High cholesterol   . Hypertension   . Lumbago   . Sciatica   . Vitamin D deficiency     Past Surgical History:  Procedure Laterality Date  . CATARACT EXTRACTION Right 12/2018  . CATARACT EXTRACTION, BILATERAL  01/2019  . TUBAL LIGATION     age 56    Social History   Socioeconomic History  . Marital status: Married    Spouse name: Not on file  . Number of children: 3  . Years of education: Not on file  . Highest education level: Not on file  Occupational History  . Occupation: retired  Tobacco Use  . Smoking status: Never Smoker  . Smokeless tobacco: Never Used  Substance and Sexual Activity  . Alcohol use: No  . Drug use: No  . Sexual activity: Yes  Other Topics Concern  . Not on file  Social History Narrative   Patient lives at home with her husband and she is retired. She has a high school education and 3 children   Social Determinants of Health   Financial Resource Strain:   . Difficulty of Paying Living Expenses: Not on file  Food Insecurity:   . Worried About Charity fundraiser in the Last Year: Not  on file  . Ran Out of Food in the Last Year: Not on file  Transportation Needs:   . Lack of Transportation (Medical): Not on file  . Lack of Transportation (Non-Medical): Not on file  Physical Activity: Insufficiently Active  . Days of Exercise per Week: 1 day  . Minutes of Exercise per Session: 30 min  Stress:   . Feeling of Stress : Not on file  Social Connections:   . Frequency of Communication with Friends and Family: Not on file  . Frequency of Social Gatherings with Friends and Family: Not on file  . Attends Religious Services: Not on file  . Active Member of Clubs or Organizations: Not on file  . Attends Archivist Meetings: Not on file  . Marital Status: Not on file  Intimate Partner Violence:   . Fear of Current or Ex-Partner: Not on file  . Emotionally Abused: Not on file  . Physically Abused: Not on file  . Sexually Abused: Not on file    Current Outpatient Medications on File Prior to Visit  Medication Sig Dispense Refill  . Ascorbic Acid (VITAMIN C) 100 MG tablet Take 100 mg by mouth as needed.    . cholecalciferol (VITAMIN D3) 25 MCG (1000 UT) tablet Take 1,000 Units by mouth daily.    Marland Kitchen  co-enzyme Q-10 30 MG capsule Take 30 mg by mouth daily.    . Multiple Vitamin (MULTIVITAMIN) tablet Take 1 tablet by mouth daily.    . NON FORMULARY Green protein drink    . NON FORMULARY Black seed drink    . Omega-3-6-9 CAPS Take by mouth.    . spironolactone (ALDACTONE) 25 MG tablet Take 1 tablet (25 mg total) by mouth daily. 90 tablet 1  . valsartan-hydrochlorothiazide (DIOVAN-HCT) 320-25 MG tablet Take 1 tablet by mouth daily. 90 tablet 1   No current facility-administered medications on file prior to visit.   Review of Systems  Constitution: Negative for chills, decreased appetite, malaise/fatigue and weight gain.  Cardiovascular: Negative for chest pain, dyspnea on exertion, leg swelling, palpitations and syncope. Cyanosis: mild and stable.  Respiratory:  Positive for snoring. Negative for hemoptysis.   Endocrine: Negative for cold intolerance.  Hematologic/Lymphatic: Does not bruise/bleed easily.  Musculoskeletal: Positive for joint pain (left should). Negative for joint swelling.  Gastrointestinal: Negative for abdominal pain, anorexia and change in bowel habit.  Neurological: Positive for paresthesias (right leg cramps and left arm tingling). Negative for headaches and light-headedness.  Psychiatric/Behavioral: Negative for depression and substance abuse.  All other systems reviewed and are negative.     Objective  Blood pressure (!) 160/80, pulse 80, height 5\' 6"  (1.676 m), weight 165 lb (74.8 kg), SpO2 97 %. Body mass index is 26.63 kg/m. Physical Exam  Constitutional: She appears well-developed and well-nourished. No distress.  HENT:  Head: Atraumatic.  Eyes: Conjunctivae are normal.  Neck: No thyromegaly present.  Cardiovascular: Normal rate, regular rhythm, intact distal pulses and normal pulses. Exam reveals no gallop.  Murmur heard.  Early systolic murmur is present with a grade of 2/6 at the upper right sternal border. No edema, no JVD  Pulmonary/Chest: Effort normal and breath sounds normal.  Abdominal: Soft. Bowel sounds are normal.  Musculoskeletal:        General: Normal range of motion.     Cervical back: Neck supple.  Neurological: She is alert.  Skin: Skin is warm and dry.  Psychiatric: She has a normal mood and affect.   Radiology: No results found.  Laboratory examination:    CMP Latest Ref Rng & Units 01/08/2019 07/05/2018 04/04/2018  Glucose 65 - 99 mg/dL 105(H) 96 96  BUN 8 - 27 mg/dL 18 15 19   Creatinine 0.57 - 1.00 mg/dL 1.07(H) 0.94 0.90  Sodium 134 - 144 mmol/L 139 139 139  Potassium 3.5 - 5.2 mmol/L 4.2 4.0 4.3  Chloride 96 - 106 mmol/L 100 99 101  CO2 20 - 29 mmol/L 26 22 22   Calcium 8.7 - 10.3 mg/dL 9.9 9.9 9.8  Total Protein 6.0 - 8.5 g/dL 7.5 - 6.9  Total Bilirubin 0.0 - 1.2 mg/dL 0.5 - 1.0    Alkaline Phos 39 - 117 IU/L 64 - 57  AST 0 - 40 IU/L 23 - 20  ALT 0 - 32 IU/L 19 - 15   CBC Latest Ref Rng & Units 01/08/2019 07/05/2018 12/28/2017  WBC 3.4 - 10.8 x10E3/uL 5.5 5.5 4.5  Hemoglobin 11.1 - 15.9 g/dL 13.6 13.5 13.3  Hematocrit 34.0 - 46.6 % 40.4 39.7 39.7  Platelets 150 - 450 x10E3/uL 207 217 192   Lipid Panel     Component Value Date/Time   CHOL 181 01/08/2019 1246   TRIG 53 01/08/2019 1246   HDL 54 01/08/2019 1246   CHOLHDL 3.4 01/08/2019 1246   LDLCALC 117 (H) 01/08/2019 1246  HEMOGLOBIN A1C Lab Results  Component Value Date   HGBA1C 6.3 (H) 01/08/2019   TSH No results for input(s): TSH in the last 8760 hours.  Cardiac Studies:   Nuclear stress test [06/15/2015]: 1. The resting electrocardiogram demonstrated normal sinus rhythm, RBBB and no resting arrhythmias. The stress electrocardiogram was normal. Patient exercised on Bruce protocol for 5:03 minutes and achieved 7.05 METS. Stress test terminated due to target heart rate( 87% MPHR) and dyspnea. 2. Myocardial perfusion imaging is normal. Overall left ventricular systolic function was normal without regional wall motion abnormalities. The left ventricular ejection fraction was 80%.  Renal Dopplers [05/13/2015]: No evidence of renal artery occlusive disease in either renal artery. Normal intrarenal vascular perfusion is noted in both kidneys. Mild increase in the echogenicity of both kidneys suggest medico-renal disease. Normal sized kidneys. Compared to the study done on 01/09/14, right renal artery stenosis not evident in the present study.  Renal Dopplers [01/09/2014]: Renal artery duplex: Hemodynamically significant stenosis of the right renal artery. Normal intrarenal vascular perfusion is noted in both kidneys. < 50% stenosis of the left renal artery ostium. Bilateral renal size is normal.  Echocardiogram 07/31/2018: Normal LV systolic function with EF 56%. Left ventricle cavity is normal in size.  Moderate concentric hypertrophy of the left ventricle. Normal global wall motion. Doppler evidence of grade I (impaired) diastolic dysfunction, normal LAP. Calculated EF 56%. Left atrial cavity is mildly dilated. Mild (Grade I) mitral regurgitation. Mild to moderate tricuspid regurgitation. Estimated pulmonary artery systolic pressure 24 mmHg, which is normal.  No significant change compared to previous study in 2015  Assessment     ICD-10-CM   1. Essential hypertension  I10 verapamil (CALAN-SR) 120 MG CR tablet  2. Dyspnea on exertion  R06.00 EKG 12-Lead  3. Mild hypercholesterolemia  E78.00   4. Pre-diabetes  R73.03     EKG 02/21/2019: Sinus rhythm with first-degree AV block at the rate of 66 bpm, normal axis.  Right bundle branch block.  No evidence of ischemia.  No significant change from 07/12/2018.  Recommendations:    Ms. Shonice Curling is a 73 y.o. Afro-American female with difficult control hypertension, diet-controlled diabetes, chronic shortness breath and hypertension with hypertensive heart.  She does not want to be on statins, she is also very particular about the medications.   She is presently doing well without dyspnea or chest pain, no palpitations.  She could not tolerate labetalol due to headaches, the blood pressure was elevated today again, although home mostly well controlled but also episodes of hypertension at home.  She states that verapamil has worked well in the past, although she has underlying first-degree AV block it should be safe to start her on 120 mg SR verapamil.  I reviewed the recently performed labs from her PCP at the request of the patient, she does have hyperglycemia.  In view of this and in view of hypertension and her race, although hyperlipidemia is mild, I have recommended statin therapy.  She prefers to do lifestyle modification, we discussed regarding exercising 30 minutes every day and also decreasing her fat intake in her diet and weight loss of  15 pounds discussed.  Him back in 6 months or sooner if problems.  She request 90 day supply if blood pressure is controlled with sending for verapamil SR to mail-order pharmacy.  Adrian Prows, MD, [facility] 02/21/2019, 10:42 AM [facility] [place] Pager: [phone] Office: [phone] If no answer Cell [phone]

## 2019-04-10 ENCOUNTER — Other Ambulatory Visit: Payer: Medicare Other

## 2019-04-10 ENCOUNTER — Ambulatory Visit: Payer: Medicare Other

## 2019-06-17 ENCOUNTER — Encounter: Payer: Self-pay | Admitting: Nurse Practitioner

## 2019-06-18 ENCOUNTER — Encounter: Payer: Self-pay | Admitting: Nurse Practitioner

## 2019-06-18 DIAGNOSIS — H26493 Other secondary cataract, bilateral: Secondary | ICD-10-CM | POA: Diagnosis not present

## 2019-06-18 DIAGNOSIS — E119 Type 2 diabetes mellitus without complications: Secondary | ICD-10-CM | POA: Diagnosis not present

## 2019-06-18 DIAGNOSIS — Z961 Presence of intraocular lens: Secondary | ICD-10-CM | POA: Diagnosis not present

## 2019-06-18 LAB — HM DIABETES EYE EXAM

## 2019-06-19 ENCOUNTER — Ambulatory Visit
Admission: RE | Admit: 2019-06-19 | Discharge: 2019-06-19 | Disposition: A | Discharge status: Home / Self Care | Payer: Medicare Other | Source: Ambulatory Visit | Attending: Nurse Practitioner | Admitting: Nurse Practitioner

## 2019-06-19 ENCOUNTER — Other Ambulatory Visit: Payer: Self-pay

## 2019-06-19 DIAGNOSIS — Z1231 Encounter for screening mammogram for malignant neoplasm of breast: Secondary | ICD-10-CM

## 2019-06-19 DIAGNOSIS — M8589 Other specified disorders of bone density and structure, multiple sites: Secondary | ICD-10-CM | POA: Diagnosis not present

## 2019-06-19 DIAGNOSIS — Z780 Asymptomatic menopausal state: Secondary | ICD-10-CM | POA: Diagnosis not present

## 2019-06-19 DIAGNOSIS — M85851 Other specified disorders of bone density and structure, right thigh: Secondary | ICD-10-CM

## 2019-07-09 ENCOUNTER — Other Ambulatory Visit: Payer: Self-pay

## 2019-07-09 ENCOUNTER — Ambulatory Visit (INDEPENDENT_AMBULATORY_CARE_PROVIDER_SITE_OTHER): Payer: Medicare Other | Admitting: Nurse Practitioner

## 2019-07-09 ENCOUNTER — Ambulatory Visit: Payer: Medicare Other | Admitting: Nurse Practitioner

## 2019-07-09 ENCOUNTER — Encounter: Payer: Self-pay | Admitting: Nurse Practitioner

## 2019-07-09 VITALS — BP 128/76 | HR 65 | Temp 98.5°F | Ht 65.8 in | Wt 159.6 lb

## 2019-07-09 DIAGNOSIS — M25511 Pain in right shoulder: Secondary | ICD-10-CM | POA: Diagnosis not present

## 2019-07-09 DIAGNOSIS — I10 Essential (primary) hypertension: Secondary | ICD-10-CM | POA: Diagnosis not present

## 2019-07-09 DIAGNOSIS — M25562 Pain in left knee: Secondary | ICD-10-CM

## 2019-07-09 DIAGNOSIS — G8929 Other chronic pain: Secondary | ICD-10-CM | POA: Insufficient documentation

## 2019-07-09 DIAGNOSIS — M25561 Pain in right knee: Secondary | ICD-10-CM | POA: Diagnosis not present

## 2019-07-09 DIAGNOSIS — Z1159 Encounter for screening for other viral diseases: Secondary | ICD-10-CM | POA: Diagnosis not present

## 2019-07-09 DIAGNOSIS — R7303 Prediabetes: Secondary | ICD-10-CM | POA: Diagnosis not present

## 2019-07-09 MED ORDER — VALSARTAN-HYDROCHLOROTHIAZIDE 320-25 MG PO TABS: 1.0000 | ORAL_TABLET | Freq: Every day | ORAL | 1 refills | Status: DC

## 2019-07-09 MED ORDER — SPIRONOLACTONE 25 MG PO TABS: 25.0000 mg | ORAL_TABLET | Freq: Every day | ORAL | 1 refills | Status: DC

## 2019-07-09 NOTE — Progress Notes (Signed)
Subjective:     Patient ID: Julia Gates , female    DOB: December 15, 1945 , 74 y.o.   MRN: [id number]   Chief Complaint  Patient presents with  . Diabetes    HPI  She had the [place] and [employer] 19 vaccine - had done at [facility]- did well.  Hypertension This is a chronic problem. The current episode started more than 1 year ago. The problem is unchanged. The problem is controlled. Pertinent negatives include no anxiety, chest pain or palpitations. There are no associated agents to hypertension. There are no known risk factors for coronary artery disease. Past treatments include angiotensin blockers and diuretics. There are no compliance problems.  There is no history of angina. There is no history of chronic renal disease.  Diabetes She presents for her follow-up diabetic visit. Diabetes type: prediabetes. There are no hypoglycemic associated symptoms. Pertinent negatives for diabetes include no chest pain, no fatigue, no polydipsia, no polyphagia and no polyuria. There are no hypoglycemic complications. There are no diabetic complications. She has not had a previous visit with a dietitian. She rarely participates in exercise. There is no change in her home blood glucose trend. An ACE inhibitor/angiotensin II receptor blocker is being taken. She does not see a podiatrist.Eye exam is current.  Knee Pain  The incident occurred more than 1 week ago (1 month). Pain location: bilateral knee pain when praying. Exacerbated by: when trying to pray on her knees. Treatments tried: pain cream.   Past Medical History:  Diagnosis Date  . Abnormal glucose level   . High cholesterol   . Hypertension   . Lumbago   . Sciatica   . Vitamin D deficiency      Family History  Problem Relation Age of Onset  . Heart disease Mother   . Diabetes Mother   . Diabetes Brother   . Diabetes Sister      Current Outpatient Medications:  .  Ascorbic Acid (VITAMIN C) 100 MG tablet, Take 100 mg by mouth as  needed., Disp: , Rfl:  .  cholecalciferol (VITAMIN D3) 25 MCG (1000 UT) tablet, Take 1,000 Units by mouth daily., Disp: , Rfl:  .  co-enzyme Q-10 30 MG capsule, Take 30 mg by mouth daily., Disp: , Rfl:  .  Multiple Vitamin (MULTIVITAMIN) tablet, Take 1 tablet by mouth daily., Disp: , Rfl:  .  NON FORMULARY, Green protein drink, Disp: , Rfl:  .  NON FORMULARY, Black seed drink, Disp: , Rfl:  .  Omega-3-6-9 CAPS, Take by mouth., Disp: , Rfl:  .  spironolactone (ALDACTONE) 25 MG tablet, Take 1 tablet (25 mg total) by mouth daily., Disp: 90 tablet, Rfl: 1 .  valsartan-hydrochlorothiazide (DIOVAN-HCT) 320-25 MG tablet, Take 1 tablet by mouth daily., Disp: 90 tablet, Rfl: 1 .  verapamil (CALAN-SR) 120 MG CR tablet, Take 1 tablet (120 mg total) by mouth at bedtime., Disp: 30 tablet, Rfl: 2   Allergies  Allergen Reactions  . Penicillins Rash     Review of Systems  Constitutional: Negative.  Negative for fatigue.  Respiratory: Negative.   Cardiovascular: Negative.  Negative for chest pain, palpitations and leg swelling.  Endocrine: Negative.  Negative for polydipsia, polyphagia and polyuria.  Musculoskeletal:       Right shoulder pain  Neurological: Negative.   Hematological: Negative.   Psychiatric/Behavioral: Negative.      Today's Vitals   07/09/19 1010  BP: 128/76  Pulse: 65  Temp: 98.5 F (36.9 C)  TempSrc: Oral  Weight: 159 lb 9.6 oz (72.4 kg)  Height: 5' 5.8" (1.671 m)  PainSc: 0-No pain   Body mass index is 25.92 kg/m.   Objective:  Physical Exam Vitals reviewed.  Constitutional:      General: She is not in acute distress.    Appearance: Normal appearance. She is well-developed.  HENT:     Head: Normocephalic and atraumatic.     Right Ear: Hearing normal.     Left Ear: Hearing normal.  Eyes:     General: Lids are normal.     Extraocular Movements: Extraocular movements intact.     Conjunctiva/sclera: Conjunctivae normal.     Pupils: Pupils are equal, round, and  reactive to light.     Funduscopic exam:    Right eye: No papilledema.        Left eye: No papilledema.  Neck:     Thyroid: No thyroid mass.     Vascular: No carotid bruit.  Cardiovascular:     Rate and Rhythm: Normal rate and regular rhythm.     Pulses: Normal pulses.     Heart sounds: Normal heart sounds. No murmur.  Pulmonary:     Effort: Pulmonary effort is normal. No respiratory distress.     Breath sounds: Normal breath sounds.  Musculoskeletal:        General: Tenderness (mild crepitus to left knee) present. No swelling. Normal range of motion.     Cervical back: Full passive range of motion without pain.     Right lower leg: No edema.     Left lower leg: No edema.  Skin:    Capillary Refill: Capillary refill takes less than 2 seconds.  Neurological:     General: No focal deficit present.     Mental Status: She is alert and oriented to person, place, and time.     Cranial Nerves: No cranial nerve deficit.     Sensory: No sensory deficit.  Psychiatric:        Mood and Affect: Mood normal.        Behavior: Behavior normal.        Thought Content: Thought content normal.        Judgment: Judgment normal.         Assessment And Plan:    1. Essential hypertension . B/P is controlled.  . CMP ordered to check renal function.  . The importance of regular exercise and dietary modification was stressed to the patient.  - spironolactone (ALDACTONE) 25 MG tablet; Take 1 tablet (25 mg total) by mouth daily.  Dispense: 90 tablet; Refill: 1 - valsartan-hydrochlorothiazide (DIOVAN-HCT) 320-25 MG tablet; Take 1 tablet by mouth daily.  Dispense: 90 tablet; Refill: 1 - BMP8+eGFR  2. Prediabetes  Chronic, controlled  No current medications  Encouraged to limit intake of sugary foods and drinks  Encouraged to increase physical activity to 150 minutes per week as tolerated - Hemoglobin A1c - Lipid panel  3. Encounter for hepatitis C screening test for low risk  patient  Will check for Hepatitis C screening due to being born between the years [birth year]-1965 - Hepatitis C antibody  4. Pain in both knees, unspecified chronicity  Likely related to arthritis, only occurs with kneeling and praying  Mild crepitus to left knee   Encouraged to apply pain cream prior or to sit in a chair when praying  5. Acute pain of right shoulder  No abnormal findings on physical exam.      Minette Brine, FNP  THE PATIENT IS ENCOURAGED TO PRACTICE SOCIAL DISTANCING DUE TO THE COVID-19 PANDEMIC.   

## 2019-07-10 LAB — LIPID PANEL
Chol/HDL Ratio: 3.3 ratio (ref 0.0–4.4)
Cholesterol, Total: 166 mg/dL (ref 100–199)
HDL: 51 mg/dL (ref >39)
LDL Chol Calc (NIH): 100 mg/dL — ABNORMAL HIGH (ref 0–99)
Triglycerides: 79 mg/dL (ref 0–149)
VLDL Cholesterol Cal: 15 mg/dL (ref 5–40)

## 2019-07-10 LAB — BMP8+EGFR
BUN/Creatinine Ratio: 17 (ref 12–28)
BUN: 17 mg/dL (ref 8–27)
CO2: 22 mmol/L (ref 20–29)
Calcium: 9.7 mg/dL (ref 8.7–10.3)
Chloride: 102 mmol/L (ref 96–106)
Creatinine, Ser: 0.98 mg/dL (ref 0.57–1.00)
GFR calc Af Amer: 67 mL/min/{1.73_m2} (ref >59)
GFR calc non Af Amer: 58 mL/min/{1.73_m2} — ABNORMAL LOW (ref >59)
Glucose: 102 mg/dL — ABNORMAL HIGH (ref 65–99)
Potassium: 4.2 mmol/L (ref 3.5–5.2)
Sodium: 139 mmol/L (ref 134–144)

## 2019-07-10 LAB — HEPATITIS C ANTIBODY: Hep C Virus Ab: 0.1 s/co ratio (ref 0.0–0.9)

## 2019-07-10 LAB — HEMOGLOBIN A1C
Est. average glucose Bld gHb Est-mCnc: 137 mg/dL
Hgb A1c MFr Bld: 6.4 % — ABNORMAL HIGH (ref 4.8–5.6)

## 2019-08-21 ENCOUNTER — Encounter: Payer: Self-pay | Admitting: Cardiology

## 2019-08-21 ENCOUNTER — Other Ambulatory Visit: Payer: Self-pay

## 2019-08-21 ENCOUNTER — Ambulatory Visit: Payer: Medicare Other | Admitting: Cardiology

## 2019-08-21 VITALS — BP 130/85 | HR 74 | Ht 65.0 in | Wt 153.0 lb

## 2019-08-21 DIAGNOSIS — R7303 Prediabetes: Secondary | ICD-10-CM | POA: Diagnosis not present

## 2019-08-21 DIAGNOSIS — I10 Essential (primary) hypertension: Secondary | ICD-10-CM

## 2019-08-21 DIAGNOSIS — E7800 Pure hypercholesterolemia, unspecified: Secondary | ICD-10-CM

## 2019-08-21 MED ORDER — VERAPAMIL HCL ER 120 MG PO TBCR: 120.0000 mg | EXTENDED_RELEASE_TABLET | Freq: Every day | ORAL | 3 refills | Status: DC

## 2019-08-21 NOTE — Progress Notes (Signed)
Primary Physician/Referring:  Minette Brine, FNP  Patient ID: Julia Gates, female    DOB: 17-Oct-1945, 74 y.o.   MRN: [id number]  Chief Complaint  Patient presents with  . Hypertension  . Follow-up    6 months    HPI: Julia Gates  is a 74 y.o. female  with difficult control hypertension, diet-controlled diabetes, chronic shortness breath and hypertension with hypertensive heart disease, is very particular with need to taking her medications, does not want to be on statins.   She is here on a six-month office visit and follow-up of chest pain and also hypertension.  She has not had any recurrence of chest pain.  She has not had any dyspnea.  States that the blood pressure is relatively well controlled but has noticed elevated blood pressure at home as well. She discontinued Verapamil started 6 months ago, but states she tolerated it without side effects.  No dizziness or syncope.  Past Medical History:  Diagnosis Date  . Abnormal glucose level   . High cholesterol   . Hypertension   . Lumbago   . Sciatica   . Vitamin D deficiency     Past Surgical History:  Procedure Laterality Date  . CATARACT EXTRACTION Right 12/2018  . CATARACT EXTRACTION, BILATERAL  01/2019  . TUBAL LIGATION     age 45   Social History   Tobacco Use  . Smoking status: Never Smoker  . Smokeless tobacco: Never Used  Substance Use Topics  . Alcohol use: No   Marital Status: Married   Current Outpatient Medications on File Prior to Visit  Medication Sig Dispense Refill  . Ascorbic Acid (VITAMIN C) 100 MG tablet Take 100 mg by mouth as needed.    . cholecalciferol (VITAMIN D3) 25 MCG (1000 UT) tablet Take 1,000 Units by mouth daily.    Marland Kitchen co-enzyme Q-10 30 MG capsule Take 30 mg by mouth daily.    . Multiple Vitamin (MULTIVITAMIN) tablet Take 1 tablet by mouth daily.    . NON FORMULARY Green protein drink    . NON FORMULARY Black seed drink    . Omega-3-6-9 CAPS Take by mouth.    .  spironolactone (ALDACTONE) 25 MG tablet Take 1 tablet (25 mg total) by mouth daily. 90 tablet 1  . valsartan-hydrochlorothiazide (DIOVAN-HCT) 320-25 MG tablet Take 1 tablet by mouth daily. 90 tablet 1   No current facility-administered medications on file prior to visit.   Review of Systems  Cardiovascular: Negative for chest pain, dyspnea on exertion and leg swelling.  Musculoskeletal: Positive for joint pain.  Gastrointestinal: Negative for melena.  Neurological: Positive for paresthesias.      Objective  Blood pressure 130/85, pulse 74, height 5\' 5"  (1.651 m), weight 153 lb (69.4 kg), SpO2 94 %. Body mass index is 25.46 kg/m.   Vitals with BMI 08/21/2019 08/21/2019 07/09/2019  Height - 5\' 5"  5' 5.8"  Weight - 153 lbs 159 lbs 10 oz  BMI - 53.97 67.34  Systolic 193 790 240  Diastolic 85 85 76  Pulse - 74 65    Physical Exam Constitutional:      General: She is not in acute distress.    Appearance: She is well-developed.  Neck:     Thyroid: No thyromegaly.  Cardiovascular:     Rate and Rhythm: Normal rate and regular rhythm.     Pulses: Normal pulses and intact distal pulses.     Heart sounds: Murmur heard.  Early systolic murmur is present with  a grade of 2/6 at the upper right sternal border.  No gallop.      Comments: No edema, no JVD Pulmonary:     Effort: Pulmonary effort is normal.     Breath sounds: Normal breath sounds.  Abdominal:     General: Bowel sounds are normal.     Palpations: Abdomen is soft.    Radiology: No results found.  Laboratory examination:    CMP Latest Ref Rng & Units 07/09/2019 01/08/2019 07/05/2018  Glucose 65 - 99 mg/dL 102(H) 105(H) 96  BUN 8 - 27 mg/dL 17 18 15   Creatinine 0.57 - 1.00 mg/dL 0.98 1.07(H) 0.94  Sodium 134 - 144 mmol/L 139 139 139  Potassium 3.5 - 5.2 mmol/L 4.2 4.2 4.0  Chloride 96 - 106 mmol/L 102 100 99  CO2 20 - 29 mmol/L 22 26 22   Calcium 8.7 - 10.3 mg/dL 9.7 9.9 9.9  Total Protein 6.0 - 8.5 g/dL - 7.5 -  Total  Bilirubin 0.0 - 1.2 mg/dL - 0.5 -  Alkaline Phos 39 - 117 IU/L - 64 -  AST 0 - 40 IU/L - 23 -  ALT 0 - 32 IU/L - 19 -   CBC Latest Ref Rng & Units 01/08/2019 07/05/2018 12/28/2017  WBC 3.4 - 10.8 x10E3/uL 5.5 5.5 4.5  Hemoglobin 11.1 - 15.9 g/dL 13.6 13.5 13.3  Hematocrit 34.0 - 46.6 % 40.4 39.7 39.7  Platelets 150 - 450 x10E3/uL 207 217 192   Lipid Panel     Component Value Date/Time   CHOL 166 07/09/2019 1048   TRIG 79 07/09/2019 1048   HDL 51 07/09/2019 1048   CHOLHDL 3.3 07/09/2019 1048   LDLCALC 100 (H) 07/09/2019 1048   HEMOGLOBIN A1C Lab Results  Component Value Date   HGBA1C 6.4 (H) 07/09/2019   TSH No results for input(s): TSH in the last 8760 hours.  Cardiac Studies:   Nuclear stress test [06/15/2015]: 1. The resting electrocardiogram demonstrated normal sinus rhythm, RBBB and no resting arrhythmias. The stress electrocardiogram was normal. Patient exercised on Bruce protocol for 5:03 minutes and achieved 7.05 METS. Stress test terminated due to target heart rate( 87% MPHR) and dyspnea. 2. Myocardial perfusion imaging is normal. Overall left ventricular systolic function was normal without regional wall motion abnormalities. The left ventricular ejection fraction was 80%.  Renal Dopplers [05/13/2015]: No evidence of renal artery occlusive disease in either renal artery. Normal intrarenal vascular perfusion is noted in both kidneys. Mild increase in the echogenicity of both kidneys suggest medico-renal disease. Normal sized kidneys. Compared to the study done on 01/09/14, right renal artery stenosis not evident in the present study.  Renal Dopplers [01/09/2014]: Renal artery duplex: Hemodynamically significant stenosis of the right renal artery. Normal intrarenal vascular perfusion is noted in both kidneys. < 50% stenosis of the left renal artery ostium. Bilateral renal size is normal.  Echocardiogram 07/31/2018: Normal LV systolic function with EF 56%. Left ventricle  cavity is normal in size. Moderate concentric hypertrophy of the left ventricle. Normal global wall motion. Doppler evidence of grade I (impaired) diastolic dysfunction, normal LAP. Calculated EF 56%. Left atrial cavity is mildly dilated. Mild (Grade I) mitral regurgitation. Mild to moderate tricuspid regurgitation. Estimated pulmonary artery systolic pressure 24 mmHg, which is normal.  No significant change compared to previous study in 2015.  EKG 08/21/2019: Normal sinus rhythm at rate of 69 with chronic, left atrial enlargement, normal axis.  Right bundle branch block.  No evidence of ischemia.  Compared to 02/21/2019,  first-degree AV block not present.     Assessment     ICD-10-CM   1. Essential hypertension  I10 EKG 12-Lead    verapamil (CALAN-SR) 120 MG CR tablet  2. Mild hypercholesterolemia  E78.00   3. Pre-diabetes  R73.03      Recommendations:    Julia Gates is a 74 y.o. Afro-American female with difficult control hypertension, diet-controlled diabetes, chronic shortness breath and hypertension with hypertensive heart.  She does not want to be on statins, she is also very particular about the medications.  On her last office visit had added verapamil for hypertension, she used it for 3 months without side effects but discontinued this.  Today her blood pressure is elevated, will refill medication which is willing to start.  I reviewed her labs, with weight loss her lipids are improved but in view of prediabetes, I still suggest that she should be on a low-dose moderate intensity or high intensity statin.  Patient does not want to be on statins for now.  Otherwise she remains stable from cardiac standpoint, I will see him back on a annual basis at her request. I reviewed her external labs.   Adrian Prows, MD, [facility] 08/21/2019, 11:36 AM [facility] [place] Pager: [phone] Office: [phone]

## 2020-01-09 ENCOUNTER — Ambulatory Visit (INDEPENDENT_AMBULATORY_CARE_PROVIDER_SITE_OTHER): Payer: Medicare Other

## 2020-01-09 ENCOUNTER — Other Ambulatory Visit: Payer: Self-pay

## 2020-01-09 ENCOUNTER — Encounter: Payer: Self-pay | Admitting: Nurse Practitioner

## 2020-01-09 ENCOUNTER — Ambulatory Visit (INDEPENDENT_AMBULATORY_CARE_PROVIDER_SITE_OTHER): Payer: Medicare Other | Admitting: Nurse Practitioner

## 2020-01-09 VITALS — BP 132/80 | HR 79 | Temp 98.3°F | Ht 64.0 in | Wt 153.6 lb

## 2020-01-09 VITALS — BP 132/80 | HR 79 | Temp 98.3°F | Ht 64.0 in | Wt 153.0 lb

## 2020-01-09 DIAGNOSIS — M25561 Pain in right knee: Secondary | ICD-10-CM

## 2020-01-09 DIAGNOSIS — G8929 Other chronic pain: Secondary | ICD-10-CM

## 2020-01-09 DIAGNOSIS — Z0000 Encounter for general adult medical examination without abnormal findings: Secondary | ICD-10-CM | POA: Diagnosis not present

## 2020-01-09 DIAGNOSIS — I10 Essential (primary) hypertension: Secondary | ICD-10-CM

## 2020-01-09 DIAGNOSIS — E7800 Pure hypercholesterolemia, unspecified: Secondary | ICD-10-CM | POA: Diagnosis not present

## 2020-01-09 DIAGNOSIS — R7303 Prediabetes: Secondary | ICD-10-CM

## 2020-01-09 DIAGNOSIS — M25511 Pain in right shoulder: Secondary | ICD-10-CM | POA: Diagnosis not present

## 2020-01-09 DIAGNOSIS — M25562 Pain in left knee: Secondary | ICD-10-CM | POA: Diagnosis not present

## 2020-01-09 DIAGNOSIS — Z23 Encounter for immunization: Secondary | ICD-10-CM

## 2020-01-09 LAB — CBC
Hematocrit: 41.6 % (ref 34.0–46.6)
Hemoglobin: 13.7 g/dL (ref 11.1–15.9)
MCH: 30.5 pg (ref 26.6–33.0)
MCHC: 32.9 g/dL (ref 31.5–35.7)
MCV: 93 fL (ref 79–97)
Platelets: 239 10*3/uL (ref 150–450)
RBC: 4.49 x10E6/uL (ref 3.77–5.28)
RDW: 12.8 % (ref 11.7–15.4)
WBC: 4.6 10*3/uL (ref 3.4–10.8)

## 2020-01-09 LAB — CMP14+EGFR
ALT: 18 IU/L (ref 0–32)
AST: 19 IU/L (ref 0–40)
Albumin/Globulin Ratio: 1.6 (ref 1.2–2.2)
Albumin: 4.6 g/dL (ref 3.7–4.7)
Alkaline Phosphatase: 66 IU/L (ref 44–121)
BUN/Creatinine Ratio: 15 (ref 12–28)
BUN: 14 mg/dL (ref 8–27)
Bilirubin Total: 0.7 mg/dL (ref 0.0–1.2)
CO2: 25 mmol/L (ref 20–29)
Calcium: 10 mg/dL (ref 8.7–10.3)
Chloride: 100 mmol/L (ref 96–106)
Creatinine, Ser: 0.96 mg/dL (ref 0.57–1.00)
GFR calc Af Amer: 68 mL/min/{1.73_m2} (ref >59)
GFR calc non Af Amer: 59 mL/min/{1.73_m2} — ABNORMAL LOW (ref >59)
Globulin, Total: 2.8 g/dL (ref 1.5–4.5)
Glucose: 103 mg/dL — ABNORMAL HIGH (ref 65–99)
Potassium: 4.3 mmol/L (ref 3.5–5.2)
Sodium: 139 mmol/L (ref 134–144)
Total Protein: 7.4 g/dL (ref 6.0–8.5)

## 2020-01-09 LAB — HEMOGLOBIN A1C
Est. average glucose Bld gHb Est-mCnc: 137 mg/dL
Hgb A1c MFr Bld: 6.4 % — ABNORMAL HIGH (ref 4.8–5.6)

## 2020-01-09 LAB — POCT URINALYSIS DIPSTICK
Bilirubin, UA: NEGATIVE
Blood, UA: NEGATIVE
Glucose, UA: NEGATIVE
Ketones, UA: NEGATIVE
Leukocytes, UA: NEGATIVE
Nitrite, UA: NEGATIVE
Protein, UA: NEGATIVE
Spec Grav, UA: >=1.03 — AB (ref 1.010–1.025)
Urobilinogen, UA: 0.2 E.U./dL
pH, UA: 6 (ref 5.0–8.0)

## 2020-01-09 LAB — POCT UA - MICROALBUMIN
Albumin/Creatinine Ratio, Urine, POC: 30
Creatinine, POC: 300 mg/dL
Microalbumin Ur, POC: 30 mg/L

## 2020-01-09 MED ORDER — BOOSTRIX 5-2.5-18.5 LF-MCG/0.5 IM SUSP: 0.5000 mL | Freq: Once | INTRAMUSCULAR | 0 refills | Status: AC

## 2020-01-09 NOTE — Progress Notes (Signed)
This visit occurred during the SARS-CoV-2 public health emergency.  Safety protocols were in place, including screening questions prior to the visit, additional usage of staff PPE, and extensive cleaning of exam room while observing appropriate contact time as indicated for disinfecting solutions.  Subjective:   Julia Gates is a 74 y.o. female who presents for Medicare Annual (Subsequent) preventive examination.  Review of Systems     Cardiac Risk Factors include: advanced age (>12men, >44 women);hypertension;sedentary lifestyle     Objective:    Today's Vitals   01/09/20 1011 01/09/20 1013  BP: 132/80   Pulse: 79   Temp: 98.3 F (36.8 C)   TempSrc: Oral   Weight: 153 lb (69.4 kg)   Height: 5\' 4"  (1.626 m)   PainSc:  5    Body mass index is 26.26 kg/m.  Advanced Directives 01/09/2020 01/08/2019 12/28/2017  Does Patient Have a Medical Advance Directive? No No No  Would patient like information on creating a medical advance directive? Yes (MAU/Ambulatory/Procedural Areas - Information given) - Yes (MAU/Ambulatory/Procedural Areas - Information given)    Current Medications (verified) Outpatient Encounter Medications as of 01/09/2020  Medication Sig  . Ascorbic Acid (VITAMIN C) 100 MG tablet Take 100 mg by mouth as needed.  . cholecalciferol (VITAMIN D3) 25 MCG (1000 UT) tablet Take 1,000 Units by mouth daily.  Marland Kitchen co-enzyme Q-10 30 MG capsule Take 30 mg by mouth daily.  . Multiple Vitamin (MULTIVITAMIN) tablet Take 1 tablet by mouth daily.  . NON FORMULARY Green protein drink  . NON FORMULARY Black seed drink  . Omega-3-6-9 CAPS Take by mouth.  . spironolactone (ALDACTONE) 25 MG tablet Take 1 tablet (25 mg total) by mouth daily.  . Tdap (BOOSTRIX) 5-2.5-18.5 LF-MCG/0.5 injection Inject 0.5 mLs into the muscle once for 1 dose.  . valsartan-hydrochlorothiazide (DIOVAN-HCT) 320-25 MG tablet Take 1 tablet by mouth daily.  . verapamil (CALAN-SR) 120 MG CR tablet Take 1 tablet  (120 mg total) by mouth at bedtime.   No facility-administered encounter medications on file as of 01/09/2020.    Allergies (verified) Penicillins   History: Past Medical History:  Diagnosis Date  . Abnormal glucose level   . High cholesterol   . Hypertension   . Lumbago   . Sciatica   . Vitamin D deficiency    Past Surgical History:  Procedure Laterality Date  . CATARACT EXTRACTION Right 12/2018  . CATARACT EXTRACTION, BILATERAL  01/2019  . TUBAL LIGATION     age 77   Family History  Problem Relation Age of Onset  . Heart disease Mother   . Diabetes Mother   . Diabetes Brother   . Diabetes Sister    Social History   Socioeconomic History  . Marital status: Married    Spouse name: Not on file  . Number of children: 3  . Years of education: Not on file  . Highest education level: Not on file  Occupational History  . Occupation: retired  Tobacco Use  . Smoking status: Never Smoker  . Smokeless tobacco: Never Used  Vaping Use  . Vaping Use: Never used  Substance and Sexual Activity  . Alcohol use: No  . Drug use: No  . Sexual activity: Yes  Other Topics Concern  . Not on file  Social History Narrative   Patient lives at home with her husband and she is retired. She has a high school education and 3 children   Social Determinants of Health   Financial Resource Strain: Low  Risk   . Difficulty of Paying Living Expenses: Not hard at all  Food Insecurity: No Food Insecurity  . Worried About Charity fundraiser in the Last Year: Never true  . Ran Out of Food in the Last Year: Never true  Transportation Needs: No Transportation Needs  . Lack of Transportation (Medical): No  . Lack of Transportation (Non-Medical): No  Physical Activity: Inactive  . Days of Exercise per Week: 0 days  . Minutes of Exercise per Session: 0 min  Stress: No Stress Concern Present  . Feeling of Stress : Not at all  Social Connections:   . Frequency of Communication with Friends  and Family: Not on file  . Frequency of Social Gatherings with Friends and Family: Not on file  . Attends Religious Services: Not on file  . Active Member of Clubs or Organizations: Not on file  . Attends Archivist Meetings: Not on file  . Marital Status: Not on file    Tobacco Counseling Counseling given: Not Answered   Clinical Intake:  Pre-visit preparation completed: Yes  Pain : 0-10 Pain Score: 5  Pain Type: Chronic pain Pain Location: Shoulder Pain Orientation: Right Pain Descriptors / Indicators: Aching Pain Onset: More than a month ago Pain Frequency: Constant     Nutritional Status: BMI 25 -29 Overweight Nutritional Risks: None Diabetes: No  How often do you need to have someone help you when you read instructions, pamphlets, or other written materials from your doctor or pharmacy?: 1 - Never What is the last grade level you completed in school?: 12th grade  Diabetic?no no Interpreter Needed?: No  Information entered by :: NAllen LPN   Activities of Daily Living In your present state of health, do you have any difficulty performing the following activities: 01/09/2020 01/09/2020  Hearing? N N  Vision? N N  Difficulty concentrating or making decisions? Y N  Comment sometimes memory -  Walking or climbing stairs? Y N  Comment with knees -  Dressing or bathing? N N  Doing errands, shopping? N N  Preparing Food and eating ? N -  Using the Toilet? N -  In the past six months, have you accidently leaked urine? Y -  Comment if doesn't get to BR fast enough -  Do you have problems with loss of bowel control? N -  Managing your Medications? N -  Managing your Finances? N -  Housekeeping or managing your Housekeeping? N -  Some recent data might be hidden    Patient Care Team: Minette Brine, FNP as PCP - General (General Practice)  Indicate any recent Medical Services you may have received from other than [facility] providers in the past year (date  may be approximate).     Assessment:   This is a routine wellness examination for Julia Gates.  Hearing/Vision screen  Hearing Screening   125Hz  250Hz  500Hz  1000Hz  2000Hz  3000Hz  4000Hz  6000Hz  8000Hz   Right ear:           Left ear:           Vision Screening Comments: Regular eye exams, Dr. Katy Fitch  Dietary issues and exercise activities discussed: Current Exercise Habits: The patient does not participate in regular exercise at present  Goals    .  Exercise 150 min/wk Moderate Activity (pt-stated)    .  Patient Stated      01/09/2020, exercise more      Depression Screen [facility] 2/9 Scores 01/09/2020 07/09/2019 01/08/2019 07/05/2018 04/04/2018 12/28/2017  PHQ - 2 Score 0 0 3 0 0 0  PHQ- 9 Score 3 - 3 - - 1    Fall Risk Fall Risk  01/09/2020 07/09/2019 01/08/2019 07/05/2018 04/04/2018  Falls in the past year? 0 0 0 0 0  Number falls in past yr: - - 0 - -  Risk for fall due to : Medication side effect - Medication side effect - -  Follow up Falls evaluation completed;Education provided;Falls prevention discussed - Falls evaluation completed;Education provided;Falls prevention discussed - -    Any stairs in or around the home? Yes  If so, are there any without handrails? No  Home free of loose throw rugs in walkways, pet beds, electrical cords, etc? Yes  Adequate lighting in your home to reduce risk of falls? No   ASSISTIVE DEVICES UTILIZED TO PREVENT FALLS:  Life alert? No  Use of a cane, walker or w/c? No  Grab bars in the bathroom? Yes  Shower chair or bench in shower? Yes  Elevated toilet seat or a handicapped toilet? Yes   TIMED UP AND GO:  Was the test performed? No .   Gait steady and fast without use of assistive device  Cognitive Function:     6CIT Screen 01/09/2020 01/08/2019 12/28/2017  What Year? 0 points 0 points 0 points  What month? 0 points 0 points 0 points  What time? 0 points 0 points 0 points  Count back from 20 0 points 0 points 0 points  Months in reverse 0  points 0 points 0 points  Repeat phrase 0 points 0 points 0 points  Total Score 0 0 0    Immunizations Immunization History  Administered Date(s) Administered  . Janssen (J&J) SARS-COV-2 Vaccination 06/15/2019    TDAP status: Due, Education has been provided regarding the importance of this vaccine. Advised may receive this vaccine at local pharmacy or Health Dept. Aware to provide a copy of the vaccination record if obtained from local pharmacy or Health Dept. Verbalized acceptance and understanding. Flu Vaccine status: Declined, Education has been provided regarding the importance of this vaccine but patient still declined. Advised may receive this vaccine at local pharmacy or Health Dept. Aware to provide a copy of the vaccination record if obtained from local pharmacy or Health Dept. Verbalized acceptance and understanding. Pneumococcal vaccine status: Declined,  Education has been provided regarding the importance of this vaccine but patient still declined. Advised may receive this vaccine at local pharmacy or Health Dept. Aware to provide a copy of the vaccination record if obtained from local pharmacy or Health Dept. Verbalized acceptance and understanding.  Covid-19 vaccine status: Completed vaccines  Qualifies for Shingles Vaccine? Yes   Zostavax completed No   Shingrix Completed?: No.    Education has been provided regarding the importance of this vaccine. Patient has been advised to call insurance company to determine out of pocket expense if they have not yet received this vaccine. Advised may also receive vaccine at local pharmacy or Health Dept. Verbalized acceptance and understanding.  Screening Tests Health Maintenance  Topic Date Due  . TETANUS/TDAP  Never done  . INFLUENZA VACCINE  06/04/2020 (Originally 10/06/2019)  . PNA vac Low Risk Adult (1 of 2 - PCV13) 01/08/2021 (Originally 11/15/2011)  . COLONOSCOPY  04/06/2021  . MAMMOGRAM  06/18/2021  . DEXA SCAN  Completed  .  COVID-19 Vaccine  Completed  . Hepatitis C Screening  Completed    Health Maintenance  Health Maintenance Due  Topic Date Due  .  TETANUS/TDAP  Never done    Colorectal cancer screening: Completed 04/06/2016. Repeat every 10 years Mammogram status: Completed 06/19/2019. Repeat every year Bone Density status: Completed 06/19/2019. Results reflect: Bone density results: OSTEOPENIA. Repeat every 5 years.  Lung Cancer Screening: (Low Dose CT Chest recommended if Age 82-80 years, 30 pack-year currently smoking OR have quit w/in 15years.) does not qualify.   Lung Cancer Screening Referral: no  Additional Screening:  Hepatitis C Screening: does qualify; Completed 07/09/2019  Vision Screening: Recommended annual ophthalmology exams for early detection of glaucoma and other disorders of the eye. Is the patient up to date with their annual eye exam?  Yes  Who is the provider or what is the name of the office in which the patient attends annual eye exams? Dr. Katy Fitch If pt is not established with a provider, would they like to be referred to a provider to establish care? No .   Dental Screening: Recommended annual dental exams for proper oral hygiene  Community Resource Referral / Chronic Care Management: CRR required this visit?  No   CCM required this visit?  No      Plan:     I have personally reviewed and noted the following in the patient's chart:   . Medical and social history . Use of alcohol, tobacco or illicit drugs  . Current medications and supplements . Functional ability and status . Nutritional status . Physical activity . Advanced directives . List of other physicians . Hospitalizations, surgeries, and ER visits in previous 12 months . Vitals . Screenings to include cognitive, depression, and falls . Referrals and appointments  In addition, I have reviewed and discussed with patient certain preventive protocols, quality metrics, and best practice recommendations. A  written personalized care plan for preventive services as well as general preventive health recommendations were provided to patient.     Kellie Simmering, LPN   [id number]   Nurse Notes:

## 2020-01-09 NOTE — Progress Notes (Signed)
I,Yamilka Roman [employer] as a Education administrator for [employer], FNP.,have documented all relevant documentation on the behalf of Minette Brine, FNP,as directed by  Minette Brine, FNP while in the presence of Minette Brine, [place]. This visit occurred during the SARS-CoV-2 public health emergency.  Safety protocols were in place, including screening questions prior to the visit, additional usage of staff PPE, and extensive cleaning of exam room while observing appropriate contact time as indicated for disinfecting solutions.  Subjective:     Patient ID: Julia Gates , female    DOB: 29-Nov-1945 , 74 y.o.   MRN: [id number]   Chief Complaint  Patient presents with   Annual Exam    HPI  Patient here for HM and she had her AWV done with [facility]  Wt Readings from Last 3 Encounters: 01/09/20 : 153 lb 9.6 oz (69.7 kg) 08/21/19 : 153 lb (69.4 kg) 07/09/19 : 159 lb 9.6 oz (72.4 kg)    Past Medical History:  Diagnosis Date   Abnormal glucose level    High cholesterol    Hypertension    Lumbago    Sciatica    Vitamin D deficiency      Family History  Problem Relation Age of Onset   Heart disease Mother    Diabetes Mother    Diabetes Brother    Diabetes Sister      Current Outpatient Medications:    Ascorbic Acid (VITAMIN C) 100 MG tablet, Take 100 mg by mouth as needed., Disp: , Rfl:    cholecalciferol (VITAMIN D3) 25 MCG (1000 UT) tablet, Take 1,000 Units by mouth daily., Disp: , Rfl:    co-enzyme Q-10 30 MG capsule, Take 30 mg by mouth daily., Disp: , Rfl:    Multiple Vitamin (MULTIVITAMIN) tablet, Take 1 tablet by mouth daily., Disp: , Rfl:    NON FORMULARY, Green protein drink, Disp: , Rfl:    NON FORMULARY, Black seed drink, Disp: , Rfl:    Omega-3-6-9 CAPS, Take by mouth., Disp: , Rfl:    spironolactone (ALDACTONE) 25 MG tablet, Take 1 tablet (25 mg total) by mouth daily., Disp: 90 tablet, Rfl: 1   valsartan-hydrochlorothiazide (DIOVAN-HCT) 320-25 MG tablet, Take  1 tablet by mouth daily., Disp: 90 tablet, Rfl: 1   verapamil (CALAN-SR) 120 MG CR tablet, Take 1 tablet (120 mg total) by mouth at bedtime., Disp: 90 tablet, Rfl: 3   Allergies  Allergen Reactions   Penicillins Rash        No LMP recorded. Patient is postmenopausal.. Negative for Dysmenorrhea and Negative for Menorrhagia. Negative for: breast discharge, breast lump(s), breast pain and breast self exam. Associated symptoms include abnormal vaginal bleeding. Pertinent negatives include abnormal bleeding (hematology), anxiety, decreased libido, depression, difficulty falling sleep, dyspareunia, history of infertility, nocturia, sexual dysfunction, sleep disturbances, urinary incontinence, urinary urgency, vaginal discharge and vaginal itching. Diet regular; mostly healthy. The patient states her exercise level is minimal.    The patient's tobacco use is:  Social History   Tobacco Use  Smoking Status Never Smoker  Smokeless Tobacco Never Used   She has been exposed to passive smoke. The patient's alcohol use is:  Social History   Substance and Sexual Activity  Alcohol Use No    Review of Systems  Constitutional: Negative.   HENT: Negative.   Eyes: Negative.   Respiratory: Negative.   Cardiovascular: Negative.   Gastrointestinal: Negative.   Endocrine: Negative.   Genitourinary: Negative.   Musculoskeletal: Positive for arthralgias (right shoulder pain has been causing  pain radiating up neck, when making certain moves will have pain. worse at night.  Has used pain cream).  Skin: Negative.   Allergic/Immunologic: Negative.   Neurological: Negative.   Hematological: Negative.   Psychiatric/Behavioral: Negative.      Today's Vitals   01/09/20 0942  BP: 132/80  Pulse: 79  Temp: 98.3 F (36.8 C)  TempSrc: Oral  Weight: 153 lb 9.6 oz (69.7 kg)  Height: _0  (1.626 m)  PainSc: 5   PainLoc: Shoulder   Body mass index is 26.37 kg/m.   Objective:  Physical  Exam Constitutional:      General: She is not in acute distress.    Appearance: Normal appearance. She is well-developed. She is obese.  HENT:     Head: Normocephalic and atraumatic.     Right Ear: Hearing, tympanic membrane, ear canal and external ear normal. There is no impacted cerumen.     Left Ear: Hearing, tympanic membrane, ear canal and external ear normal. There is no impacted cerumen.     Nose:     Comments: Deferred - masked    Mouth/Throat:     Comments: Deferred - masked Eyes:     General: Lids are normal.     Extraocular Movements: Extraocular movements intact.     Conjunctiva/sclera: Conjunctivae normal.     Pupils: Pupils are equal, round, and reactive to light.     Funduscopic exam:    Right eye: No papilledema.        Left eye: No papilledema.  Neck:     Thyroid: No thyroid mass.     Vascular: No carotid bruit.  Cardiovascular:     Rate and Rhythm: Normal rate and regular rhythm.     Pulses: Normal pulses.     Heart sounds: Normal heart sounds. No murmur heard.   Pulmonary:     Effort: Pulmonary effort is normal. No respiratory distress.     Breath sounds: Normal breath sounds. No wheezing.  Chest:     Chest wall: No mass.     Breasts: Tanner Score is 5.        Right: Normal. No mass or tenderness.        Left: Normal. No mass or tenderness.  Abdominal:     General: Abdomen is flat. Bowel sounds are normal. There is no distension.     Palpations: Abdomen is soft.     Tenderness: There is no abdominal tenderness.  Genitourinary:    Rectum: Guaiac result negative.  Musculoskeletal:        General: Tenderness (right posterior shoulder) present. No swelling.     Cervical back: Full passive range of motion without pain, normal range of motion and neck supple.     Right lower leg: No edema.     Left lower leg: No edema.     Comments: Has pain when moving posterior and has limited range of motion  Lymphadenopathy:     Upper Body:     Right upper body: No  supraclavicular, axillary or pectoral adenopathy.     Left upper body: No supraclavicular, axillary or pectoral adenopathy.  Skin:    General: Skin is warm and dry.     Capillary Refill: Capillary refill takes less than 2 seconds.  Neurological:     General: No focal deficit present.     Mental Status: She is alert and oriented to person, place, and time.     Cranial Nerves: No cranial nerve deficit.  Sensory: No sensory deficit.  Psychiatric:        Mood and Affect: Mood normal.        Behavior: Behavior normal.        Thought Content: Thought content normal.        Judgment: Judgment normal.         Assessment And Plan:     1. Encounter for general adult medical examination w/o abnormal findings  Behavior modifications discussed and diet history reviewed.    Pt will continue to exercise regularly and modify diet with low GI, plant based foods and decrease intake of processed foods.   Recommend intake of daily multivitamin, Vitamin D, and calcium.   Recommend mammogram and colonoscopy (both are up to date) for preventive screenings, as well as recommend immunizations that include influenza, TDAP (up to date) - CBC  2. Prediabetes  Chronic, she is not on any medications  Has been stable  Discussed may need to start a medication if not improved, she is under 7 for her HgbA1c  Encouraged to avoid sweets, pastas and breads unless are wheat - Hemoglobin A1c - Lipid panel  3. Essential hypertension  Chronic, fair control  Continue with current medications  EKG was done by Dr. Einar Gip in June  Continue follow up with Dr. Einar Gip - CMP14+EGFR  4. Chronic pain of both knees  She is unable to get on her knees as she had before and she feels is affecting her daily lifestyle by not being able to perform her religious practices  Will refer to orthopedic for further evaluation - Ambulatory referral to Orthopedic Surgery  5. Chronic right shoulder pain  Worsening  since her last visit  She has decreased range of motion and has pain with movement posteriorly  Will refer to orthopedics for further evaluation - Ambulatory referral to Orthopedic Surgery     Patient was given opportunity to ask questions. Patient verbalized understanding of the plan and was able to repeat key elements of the plan. All questions were answered to their satisfaction.    Teola Bradley, FNP, have reviewed all documentation for this visit. The documentation on 01/09/20 for the exam, diagnosis, procedures, and orders are all accurate and complete.  THE PATIENT IS ENCOURAGED TO PRACTICE SOCIAL DISTANCING DUE TO THE COVID-19 PANDEMIC.

## 2020-01-09 NOTE — Patient Instructions (Signed)
Health Maintenance, Female Adopting a healthy lifestyle and getting preventive care are important in promoting health and wellness. Ask your health care provider about:  The right schedule for you to have regular tests and exams.  Things you can do on your own to prevent diseases and keep yourself healthy. What should I know about diet, weight, and exercise? Eat a healthy diet   Eat a diet that includes plenty of vegetables, fruits, low-fat dairy products, and lean protein.  Do not eat a lot of foods that are high in solid fats, added sugars, or sodium. Maintain a healthy weight Body mass index (BMI) is used to identify weight problems. It estimates body fat based on height and weight. Your health care provider can help determine your BMI and help you achieve or maintain a healthy weight. Get regular exercise Get regular exercise. This is one of the most important things you can do for your health. Most adults should:  Exercise for at least 150 minutes each week. The exercise should increase your heart rate and make you sweat (moderate-intensity exercise).  Do strengthening exercises at least twice a week. This is in addition to the moderate-intensity exercise.  Spend less time sitting. Even light physical activity can be beneficial. Watch cholesterol and blood lipids Have your blood tested for lipids and cholesterol at 73 years of age, then have this test every 5 years. Have your cholesterol levels checked more often if:  Your lipid or cholesterol levels are high.  You are older than 73 years of age.  You are at high risk for heart disease. What should I know about cancer screening? Depending on your health history and family history, you may need to have cancer screening at various ages. This may include screening for:  Breast cancer.  Cervical cancer.  Colorectal cancer.  Skin cancer.  Lung cancer. What should I know about heart disease, diabetes, and high blood  pressure? Blood pressure and heart disease  High blood pressure causes heart disease and increases the risk of stroke. This is more likely to develop in people who have high blood pressure readings, are of African descent, or are overweight.  Have your blood pressure checked: ? Every 3-5 years if you are 18-39 years of age. ? Every year if you are 40 years old or older. Diabetes Have regular diabetes screenings. This checks your fasting blood sugar level. Have the screening done:  Once every three years after age 40 if you are at a normal weight and have a low risk for diabetes.  More often and at a younger age if you are overweight or have a high risk for diabetes. What should I know about preventing infection? Hepatitis B If you have a higher risk for hepatitis B, you should be screened for this virus. Talk with your health care provider to find out if you are at risk for hepatitis B infection. Hepatitis C Testing is recommended for:  Everyone born from 1945 through 1965.  Anyone with known risk factors for hepatitis C. Sexually transmitted infections (STIs)  Get screened for STIs, including gonorrhea and chlamydia, if: ? You are sexually active and are younger than 73 years of age. ? You are older than 73 years of age and your health care provider tells you that you are at risk for this type of infection. ? Your sexual activity has changed since you were last screened, and you are at increased risk for chlamydia or gonorrhea. Ask your health care provider if   you are at risk.  Ask your health care provider about whether you are at high risk for HIV. Your health care provider may recommend a prescription medicine to help prevent HIV infection. If you choose to take medicine to prevent HIV, you should first get tested for HIV. You should then be tested every 3 months for as long as you are taking the medicine. Pregnancy  If you are about to stop having your period (premenopausal) and  you may become pregnant, seek counseling before you get pregnant.  Take 400 to 800 micrograms (mcg) of folic acid every day if you become pregnant.  Ask for birth control (contraception) if you want to prevent pregnancy. Osteoporosis and menopause Osteoporosis is a disease in which the bones lose minerals and strength with aging. This can result in bone fractures. If you are 65 years old or older, or if you are at risk for osteoporosis and fractures, ask your health care provider if you should:  Be screened for bone loss.  Take a calcium or vitamin D supplement to lower your risk of fractures.  Be given hormone replacement therapy (HRT) to treat symptoms of menopause. Follow these instructions at home: Lifestyle  Do not use any products that contain nicotine or tobacco, such as cigarettes, e-cigarettes, and chewing tobacco. If you need help quitting, ask your health care provider.  Do not use street drugs.  Do not share needles.  Ask your health care provider for help if you need support or information about quitting drugs. Alcohol use  Do not drink alcohol if: ? Your health care provider tells you not to drink. ? You are pregnant, may be pregnant, or are planning to become pregnant.  If you drink alcohol: ? Limit how much you use to 0-1 drink a day. ? Limit intake if you are breastfeeding.  Be aware of how much alcohol is in your drink. In the [place] one drink equals one 12 oz bottle of beer (355 mL), one 5 oz glass of wine (148 mL), or one 1 oz glass of hard liquor (44 mL). General instructions  Schedule regular health, dental, and eye exams.  Stay current with your vaccines.  Tell your health care provider if: ? You often feel depressed. ? You have ever been abused or do not feel safe at home. Summary  Adopting a healthy lifestyle and getting preventive care are important in promoting health and wellness.  Follow your health care provider's instructions about healthy  diet, exercising, and getting tested or screened for diseases.  Follow your health care provider's instructions on monitoring your cholesterol and blood pressure. This information is not intended to replace advice given to you by your health care provider. Make sure you discuss any questions you have with your health care provider. Document Revised: 02/14/2018 Document Reviewed: 02/14/2018 Elsevier Patient Education  2020 Elsevier Inc[employer]  

## 2020-01-09 NOTE — Patient Instructions (Signed)
Julia Gates , Thank you for taking time to come for your Medicare Wellness Visit. I appreciate your ongoing commitment to your health goals. Please review the following plan we discussed and let me know if I can assist you in the future.   Screening recommendations/referrals: Colonoscopy: completed 04/06/2016, due 04/06/2026 Mammogram: completed 06/19/2019, due 06/18/2020 Bone Density: completed 06/19/2019, due 06/18/2024 Recommended yearly ophthalmology/optometry visit for glaucoma screening and checkup Recommended yearly dental visit for hygiene and checkup  Vaccinations: Influenza vaccine: decline Pneumococcal vaccine: decline Tdap vaccine: sent to pharmacy Shingles vaccine: discussed   Covid-19: 06/15/2019  Advanced directives: Advance directive discussed with you today. I have provided a copy for you to complete at home and have notarized. Once this is complete please bring a copy in to our office so we can scan it into your chart.  Conditions/risks identified: none  Next appointment: Follow up in one year for your annual wellness visit    Preventive Care 73 Years and Older, Female Preventive care refers to lifestyle choices and visits with your health care provider that can promote health and wellness. What does preventive care include?  A yearly physical exam. This is also called an annual well check.  Dental exams once or twice a year.  Routine eye exams. Ask your health care provider how often you should have your eyes checked.  Personal lifestyle choices, including:  Daily care of your teeth and gums.  Regular physical activity.  Eating a healthy diet.  Avoiding tobacco and drug use.  Limiting alcohol use.  Practicing safe sex.  Taking low-dose aspirin every day.  Taking vitamin and mineral supplements as recommended by your health care provider. What happens during an annual well check? The services and screenings done by your health care provider during your  annual well check will depend on your age, overall health, lifestyle risk factors, and family history of disease. Counseling  Your health care provider may ask you questions about your:  Alcohol use.  Tobacco use.  Drug use.  Emotional well-being.  Home and relationship well-being.  Sexual activity.  Eating habits.  History of falls.  Memory and ability to understand (cognition).  Work and work Statistician.  Reproductive health. Screening  You may have the following tests or measurements:  Height, weight, and BMI.  Blood pressure.  Lipid and cholesterol levels. These may be checked every 5 years, or more frequently if you are over 59 years old.  Skin check.  Lung cancer screening. You may have this screening every year starting at age 73 if you have a 30-pack-year history of smoking and currently smoke or have quit within the past 15 years.  Fecal occult blood test (FOBT) of the stool. You may have this test every year starting at age 73.  Flexible sigmoidoscopy or colonoscopy. You may have a sigmoidoscopy every 5 years or a colonoscopy every 10 years starting at age 73.  Hepatitis C blood test.  Hepatitis B blood test.  Sexually transmitted disease (STD) testing.  Diabetes screening. This is done by checking your blood sugar (glucose) after you have not eaten for a while (fasting). You may have this done every 1-3 years.  Bone density scan. This is done to screen for osteoporosis. You may have this done starting at age 73.  Mammogram. This may be done every 1-2 years. Talk to your health care provider about how often you should have regular mammograms. Talk with your health care provider about your test results, treatment options, and  if necessary, the need for more tests. Vaccines  Your health care provider may recommend certain vaccines, such as:  Influenza vaccine. This is recommended every year.  Tetanus, diphtheria, and acellular pertussis (Tdap, Td)  vaccine. You may need a Td booster every 10 years.  Zoster vaccine. You may need this after age 73.  Pneumococcal 13-valent conjugate (PCV13) vaccine. One dose is recommended after age 73.  Pneumococcal polysaccharide (PPSV23) vaccine. One dose is recommended after age 1. Talk to your health care provider about which screenings and vaccines you need and how often you need them. This information is not intended to replace advice given to you by your health care provider. Make sure you discuss any questions you have with your health care provider. Document Released: 03/20/2015 Document Revised: 11/11/2015 Document Reviewed: 12/23/2014 Elsevier Interactive Patient Education  2017 [place] Prevention in the Home Falls can cause injuries. They can happen to people of all ages. There are many things you can do to make your home safe and to help prevent falls. What can I do on the outside of my home?  Regularly fix the edges of walkways and driveways and fix any cracks.  Remove anything that might make you trip as you walk through a door, such as a raised step or threshold.  Trim any bushes or trees on the path to your home.  Use bright outdoor lighting.  Clear any walking paths of anything that might make someone trip, such as rocks or tools.  Regularly check to see if handrails are loose or broken. Make sure that both sides of any steps have handrails.  Any raised decks and porches should have guardrails on the edges.  Have any leaves, snow, or ice cleared regularly.  Use sand or salt on walking paths during winter.  Clean up any spills in your garage right away. This includes oil or grease spills. What can I do in the bathroom?  Use night lights.  Install grab bars by the toilet and in the tub and shower. Do not use towel bars as grab bars.  Use non-skid mats or decals in the tub or shower.  If you need to sit down in the shower, use a plastic, non-slip  stool.  Keep the floor dry. Clean up any water that spills on the floor as soon as it happens.  Remove soap buildup in the tub or shower regularly.  Attach bath mats securely with double-sided non-slip rug tape.  Do not have throw rugs and other things on the floor that can make you trip. What can I do in the bedroom?  Use night lights.  Make sure that you have a light by your bed that is easy to reach.  Do not use any sheets or blankets that are too big for your bed. They should not hang down onto the floor.  Have a firm chair that has side arms. You can use this for support while you get dressed.  Do not have throw rugs and other things on the floor that can make you trip. What can I do in the kitchen?  Clean up any spills right away.  Avoid walking on wet floors.  Keep items that you use a lot in easy-to-reach places.  If you need to reach something above you, use a strong step stool that has a grab bar.  Keep electrical cords out of the way.  Do not use floor polish or wax that makes floors slippery. If you  must use wax, use non-skid floor wax.  Do not have throw rugs and other things on the floor that can make you trip. What can I do with my stairs?  Do not leave any items on the stairs.  Make sure that there are handrails on both sides of the stairs and use them. Fix handrails that are broken or loose. Make sure that handrails are as long as the stairways.  Check any carpeting to make sure that it is firmly attached to the stairs. Fix any carpet that is loose or worn.  Avoid having throw rugs at the top or bottom of the stairs. If you do have throw rugs, attach them to the floor with carpet tape.  Make sure that you have a light switch at the top of the stairs and the bottom of the stairs. If you do not have them, ask someone to add them for you. What else can I do to help prevent falls?  Wear shoes that:  Do not have high heels.  Have rubber bottoms.  Are  comfortable and fit you well.  Are closed at the toe. Do not wear sandals.  If you use a stepladder:  Make sure that it is fully opened. Do not climb a closed stepladder.  Make sure that both sides of the stepladder are locked into place.  Ask someone to hold it for you, if possible.  Clearly mark and make sure that you can see:  Any grab bars or handrails.  First and last steps.  Where the edge of each step is.  Use tools that help you move around (mobility aids) if they are needed. These include:  Canes.  Walkers.  Scooters.  Crutches.  Turn on the lights when you go into a dark area. Replace any light bulbs as soon as they burn out.  Set up your furniture so you have a clear path. Avoid moving your furniture around.  If any of your floors are uneven, fix them.  If there are any pets around you, be aware of where they are.  Review your medicines with your doctor. Some medicines can make you feel dizzy. This can increase your chance of falling. Ask your doctor what other things that you can do to help prevent falls. This information is not intended to replace advice given to you by your health care provider. Make sure you discuss any questions you have with your health care provider. Document Released: 12/18/2008 Document Revised: 07/30/2015 Document Reviewed: 03/28/2014 Elsevier Interactive Patient Education  2017 [employer].

## 2020-01-13 LAB — LIPID PANEL
Chol/HDL Ratio: 3.7 ratio (ref 0.0–4.4)
Cholesterol, Total: 205 mg/dL — ABNORMAL HIGH (ref 100–199)
HDL: 56 mg/dL
LDL Chol Calc (NIH): 135 mg/dL — ABNORMAL HIGH (ref 0–99)
Triglycerides: 75 mg/dL (ref 0–149)
VLDL Cholesterol Cal: 14 mg/dL (ref 5–40)

## 2020-01-13 LAB — SPECIMEN STATUS REPORT

## 2020-01-15 NOTE — Progress Notes (Signed)
Her total cholesterol is 205 was 166 goal is less than 150 and LDL is 135 goal is less than 99, limit your intake of fried and fatty foods

## 2020-01-21 DIAGNOSIS — M67911 Unspecified disorder of synovium and tendon, right shoulder: Secondary | ICD-10-CM | POA: Diagnosis not present

## 2020-01-21 DIAGNOSIS — M25511 Pain in right shoulder: Secondary | ICD-10-CM | POA: Diagnosis not present

## 2020-02-25 ENCOUNTER — Other Ambulatory Visit: Payer: Self-pay

## 2020-02-25 DIAGNOSIS — I10 Essential (primary) hypertension: Secondary | ICD-10-CM

## 2020-02-25 MED ORDER — VALSARTAN-HYDROCHLOROTHIAZIDE 320-25 MG PO TABS: 1.0000 | ORAL_TABLET | Freq: Every day | ORAL | 1 refills | Status: DC

## 2020-02-25 MED ORDER — SPIRONOLACTONE 25 MG PO TABS: 25.0000 mg | ORAL_TABLET | Freq: Every day | ORAL | 1 refills | Status: DC

## 2020-04-14 ENCOUNTER — Ambulatory Visit (INDEPENDENT_AMBULATORY_CARE_PROVIDER_SITE_OTHER): Payer: Medicare Other | Admitting: Nurse Practitioner

## 2020-04-14 ENCOUNTER — Other Ambulatory Visit: Payer: Self-pay

## 2020-04-14 ENCOUNTER — Encounter: Payer: Self-pay | Admitting: Nurse Practitioner

## 2020-04-14 VITALS — BP 158/75 | HR 95 | Temp 98.6°F | Ht 64.4 in | Wt 150.4 lb

## 2020-04-14 DIAGNOSIS — R7303 Prediabetes: Secondary | ICD-10-CM

## 2020-04-14 DIAGNOSIS — R350 Frequency of micturition: Secondary | ICD-10-CM | POA: Diagnosis not present

## 2020-04-14 DIAGNOSIS — Z23 Encounter for immunization: Secondary | ICD-10-CM | POA: Diagnosis not present

## 2020-04-14 DIAGNOSIS — E7800 Pure hypercholesterolemia, unspecified: Secondary | ICD-10-CM

## 2020-04-14 DIAGNOSIS — I10 Essential (primary) hypertension: Secondary | ICD-10-CM | POA: Diagnosis not present

## 2020-04-14 LAB — LIPID PANEL
Chol/HDL Ratio: 3.3 ratio (ref 0.0–4.4)
Cholesterol, Total: 206 mg/dL — ABNORMAL HIGH (ref 100–199)
HDL: 62 mg/dL (ref >39)
LDL Chol Calc (NIH): 131 mg/dL — ABNORMAL HIGH (ref 0–99)
Triglycerides: 70 mg/dL (ref 0–149)
VLDL Cholesterol Cal: 13 mg/dL (ref 5–40)

## 2020-04-14 LAB — BMP8+EGFR
BUN/Creatinine Ratio: 16 (ref 12–28)
BUN: 14 mg/dL (ref 8–27)
CO2: 21 mmol/L (ref 20–29)
Calcium: 9.9 mg/dL (ref 8.7–10.3)
Chloride: 102 mmol/L (ref 96–106)
Creatinine, Ser: 0.88 mg/dL (ref 0.57–1.00)
GFR calc Af Amer: 75 mL/min/{1.73_m2} (ref >59)
GFR calc non Af Amer: 65 mL/min/{1.73_m2} (ref >59)
Glucose: 107 mg/dL — ABNORMAL HIGH (ref 65–99)
Potassium: 3.8 mmol/L (ref 3.5–5.2)
Sodium: 141 mmol/L (ref 134–144)

## 2020-04-14 LAB — POCT URINALYSIS DIPSTICK
Bilirubin, UA: NEGATIVE
Blood, UA: NEGATIVE
Glucose, UA: NEGATIVE
Ketones, UA: NEGATIVE
Leukocytes, UA: NEGATIVE
Nitrite, UA: NEGATIVE
Protein, UA: NEGATIVE
Spec Grav, UA: 1.01 (ref 1.010–1.025)
Urobilinogen, UA: 0.2 E.U./dL
pH, UA: 5.5 (ref 5.0–8.0)

## 2020-04-14 LAB — POCT UA - MICROALBUMIN
Albumin/Creatinine Ratio, Urine, POC: 30
Creatinine, POC: 50 mg/dL
Microalbumin Ur, POC: 10 mg/L

## 2020-04-14 LAB — HEMOGLOBIN A1C
Est. average glucose Bld gHb Est-mCnc: 131 mg/dL
Hgb A1c MFr Bld: 6.2 % — ABNORMAL HIGH (ref 4.8–5.6)

## 2020-04-14 MED ORDER — TETANUS-DIPHTH-ACELL PERTUSSIS 5-2.5-18.5 LF-MCG/0.5 IM SUSP: 0.5000 mL | Freq: Once | INTRAMUSCULAR | 0 refills | Status: AC

## 2020-04-14 NOTE — Progress Notes (Signed)
I,Yamilka Roman [employer] as a Education administrator for [employer], FNP.,have documented all relevant documentation on the behalf of Minette Brine, FNP,as directed by  Minette Brine, FNP while in the presence of Minette Brine, [place]. This visit occurred during the SARS-CoV-2 public health emergency.  Safety protocols were in place, including screening questions prior to the visit, additional usage of staff PPE, and extensive cleaning of exam room while observing appropriate contact time as indicated for disinfecting solutions.  Subjective:     Patient ID: Julia Gates , female    DOB: 1945/08/06 , 75 y.o.   MRN: [id number]   Chief Complaint  Patient presents with  . Hypertension    HPI  Patient presents today for a blood pressure f/u.  She states "she thinks her blood pressure is up due to having to drive, I hate driving".  She ate salmon, mixed vegetables, broccoli and macaroni and cheese last night. She feels like she has drank approximately 30-40 oz of water in the last 24 hours. Reports she has intermittent tightness to her chest, however then describes a pressure to her chest. Rates 4-5/10 and states within the last year periodically come and go.      Hypertension This is a chronic problem. The current episode started more than 1 year ago. The problem is unchanged. The problem is controlled. Pertinent negatives include no anxiety, chest pain or palpitations. There are no associated agents to hypertension. There are no known risk factors for coronary artery disease. Past treatments include angiotensin blockers and diuretics. There are no compliance problems.  There is no history of angina. There is no history of chronic renal disease.  Diabetes She presents for her follow-up diabetic visit. Diabetes type: prediabetes. There are no hypoglycemic associated symptoms. Pertinent negatives for diabetes include no chest pain, no fatigue, no polydipsia, no polyphagia and no polyuria. There are no  hypoglycemic complications. There are no diabetic complications. She is compliant with treatment all of the time. She has not had a previous visit with a dietitian. She rarely participates in exercise. There is no change in her home blood glucose trend. An ACE inhibitor/angiotensin II receptor blocker is being taken. She does not see a podiatrist.Eye exam is current.     Past Medical History:  Diagnosis Date  . Abnormal glucose level   . High cholesterol   . Hypertension   . Lumbago   . Sciatica   . Vitamin D deficiency      Family History  Problem Relation Age of Onset  . Heart disease Mother   . Diabetes Mother   . Diabetes Brother   . Diabetes Sister      Current Outpatient Medications:  .  Ascorbic Acid (VITAMIN C) 100 MG tablet, Take 100 mg by mouth as needed., Disp: , Rfl:  .  cholecalciferol (VITAMIN D3) 25 MCG (1000 UT) tablet, Take 1,000 Units by mouth daily., Disp: , Rfl:  .  co-enzyme Q-10 30 MG capsule, Take 30 mg by mouth daily., Disp: , Rfl:  .  Multiple Vitamin (MULTIVITAMIN) tablet, Take 1 tablet by mouth daily., Disp: , Rfl:  .  NON FORMULARY, Green protein drink, Disp: , Rfl:  .  NON FORMULARY, Black seed drink, Disp: , Rfl:  .  Omega-3-6-9 CAPS, Take by mouth., Disp: , Rfl:  .  spironolactone (ALDACTONE) 25 MG tablet, Take 1 tablet (25 mg total) by mouth daily., Disp: 90 tablet, Rfl: 1 .  Tdap (BOOSTRIX) 5-2.5-18.5 LF-MCG/0.5 injection, Inject 0.5 mLs into the muscle  once for 1 dose., Disp: 0.5 mL, Rfl: 0 .  valsartan-hydrochlorothiazide (DIOVAN-HCT) 320-25 MG tablet, Take 1 tablet by mouth daily., Disp: 90 tablet, Rfl: 1 .  verapamil (CALAN-SR) 120 MG CR tablet, Take 1 tablet (120 mg total) by mouth at bedtime., Disp: 90 tablet, Rfl: 3   Allergies  Allergen Reactions  . Penicillins Rash     Review of Systems  Constitutional: Negative.  Negative for fatigue.  HENT: Negative.   Eyes: Negative.   Respiratory: Negative.   Cardiovascular: Negative.   Negative for chest pain, palpitations and leg swelling.  Gastrointestinal: Negative.   Endocrine: Negative.  Negative for polydipsia, polyphagia and polyuria.  Genitourinary: Positive for frequency.  Musculoskeletal: Negative.   Skin: Negative.   Neurological: Negative.   Hematological: Negative.   Psychiatric/Behavioral: Negative.      Today's Vitals   04/14/20 0933  BP: (!) 158/75  Pulse: 95  Temp: 98.6 F (37 C)  TempSrc: Oral  Weight: 150 lb 6.4 oz (68.2 kg)  Height: 5' 4.4" (1.636 m)  PainSc: 0-No pain   Body mass index is 25.5 kg/m.   Objective:  Physical Exam Constitutional:      General: She is not in acute distress.    Appearance: Normal appearance. She is normal weight.  Cardiovascular:     Rate and Rhythm: Normal rate and regular rhythm.     Pulses: Normal pulses.     Heart sounds: Normal heart sounds.  Pulmonary:     Effort: Pulmonary effort is normal. No respiratory distress.     Breath sounds: Normal breath sounds. No wheezing.  Musculoskeletal:        General: Normal range of motion.     Cervical back: Normal range of motion and neck supple.  Skin:    General: Skin is warm and dry.     Capillary Refill: Capillary refill takes less than 2 seconds.     Coloration: Skin is not jaundiced.  Neurological:     General: No focal deficit present.     Mental Status: She is alert and oriented to person, place, and time.     Cranial Nerves: No cranial nerve deficit.     Motor: No weakness.  Psychiatric:        Mood and Affect: Mood normal.        Behavior: Behavior normal.        Thought Content: Thought content normal.        Judgment: Judgment normal.         Assessment And Plan:     1. Essential hypertension . B/P is controlled.  . CMP ordered to check renal function.  . She has not been taking the verapamil that was prescribed by Dr. Nadyne Coombes  2. Prediabetes  Chronic, controlled  Continue with current medications  3. Encounter for  immunization  Will give tetanus vaccine today while in office.  TDAP will be administered to adults 82-17 years old every 10 years. - Tdap (BOOSTRIX) 5-2.5-18.5 LF-MCG/0.5 injection; Inject 0.5 mLs into the muscle once for 1 dose.  Dispense: 0.5 mL; Refill: 0   She will call for her covid vaccine  Patient was given opportunity to ask questions. Patient verbalized understanding of the plan and was able to repeat key elements of the plan. All questions were answered to their satisfaction.  Minette Brine, FNP   I, Minette Brine, FNP, have reviewed all documentation for this visit. The documentation on 04/14/20 for the exam, diagnosis, procedures, and orders are all  accurate and complete.   THE PATIENT IS ENCOURAGED TO PRACTICE SOCIAL DISTANCING DUE TO THE COVID-19 PANDEMIC.

## 2020-04-14 NOTE — Patient Instructions (Signed)

## 2020-08-05 ENCOUNTER — Ambulatory Visit: Payer: Medicare Other | Admitting: Nurse Practitioner

## 2020-08-27 ENCOUNTER — Ambulatory Visit: Payer: Medicare Other | Admitting: Cardiology

## 2020-08-27 ENCOUNTER — Encounter: Payer: Self-pay | Admitting: Cardiology

## 2020-08-27 ENCOUNTER — Other Ambulatory Visit: Payer: Self-pay

## 2020-08-27 VITALS — BP 173/92 | HR 89 | Temp 97.6°F | Resp 17 | Ht 64.4 in | Wt 150.8 lb

## 2020-08-27 DIAGNOSIS — R0600 Dyspnea, unspecified: Secondary | ICD-10-CM

## 2020-08-27 DIAGNOSIS — I10 Essential (primary) hypertension: Secondary | ICD-10-CM | POA: Diagnosis not present

## 2020-08-27 DIAGNOSIS — R0609 Other forms of dyspnea: Secondary | ICD-10-CM

## 2020-08-27 DIAGNOSIS — E7800 Pure hypercholesterolemia, unspecified: Secondary | ICD-10-CM | POA: Diagnosis not present

## 2020-08-27 MED ORDER — VERAPAMIL HCL ER 180 MG PO TBCR: 180.0000 mg | EXTENDED_RELEASE_TABLET | Freq: Every day | ORAL | 3 refills | Status: DC

## 2020-08-27 NOTE — Progress Notes (Signed)
Primary Physician/Referring:  Minette Brine, FNP  Patient ID: Julia Gates, female    DOB: [birth year], 75 y.o.   MRN: [id number]  Chief Complaint  Patient presents with   Hypertension    1 year    HPI: Chimamanda Sweigert  is a 75 y.o. female  with difficult control hypertension, diet-controlled diabetes, chronic shortness breath and hypertension with hypertensive heart disease, is very particular with need to taking her medications, does not want to be on statins.   She is here on a six-month office visit and follow-up of chest pain and also hypertension.  She has not had any recurrence of chest pain.  She has not had any dyspnea.  States that the blood pressure is relatively well controlled but has noticed elevated blood pressure at home as well. She is taking Verapamil SR regularly now and tolerating without side effects.  No dizziness or syncope.  Past Medical History:  Diagnosis Date   Abnormal glucose level    High cholesterol    Hypertension    Lumbago    Sciatica    Vitamin D deficiency     Past Surgical History:  Procedure Laterality Date   CATARACT EXTRACTION Right 12/2018   CATARACT EXTRACTION, BILATERAL  01/2019   TUBAL LIGATION     age 85   Social History   Tobacco Use   Smoking status: Never   Smokeless tobacco: Never  Substance Use Topics   Alcohol use: No   Marital Status: Married   Current Outpatient Medications on File Prior to Visit  Medication Sig Dispense Refill   Ascorbic Acid (VITAMIN C) 100 MG tablet Take 100 mg by mouth as needed.     cholecalciferol (VITAMIN D3) 25 MCG (1000 UT) tablet Take 1,000 Units by mouth daily.     co-enzyme Q-10 30 MG capsule Take 30 mg by mouth daily.     Multiple Vitamin (MULTIVITAMIN) tablet Take 1 tablet by mouth daily.     NON FORMULARY Green protein drink     NON FORMULARY Black seed drink     Omega-3-6-9 CAPS Take by mouth.     spironolactone (ALDACTONE) 25 MG tablet Take 1 tablet (25 mg total) by mouth daily.  90 tablet 1   valsartan-hydrochlorothiazide (DIOVAN-HCT) 320-25 MG tablet Take 1 tablet by mouth daily. 90 tablet 1   No current facility-administered medications on file prior to visit.   Review of Systems  Cardiovascular:  Positive for dyspnea on exertion. Negative for chest pain and leg swelling.  Musculoskeletal:  Positive for joint pain.  Gastrointestinal:  Negative for melena.  Neurological:  Positive for paresthesias.     Objective  Blood pressure (!) 173/92, pulse 89, temperature 97.6 F (36.4 C), temperature source Temporal, resp. rate 17, height 5' 4.4" (1.636 m), weight 150 lb 12.8 oz (68.4 kg), SpO2 97 %. Body mass index is 25.56 kg/m.   Vitals with BMI 09/03/2020 08/27/2020 08/27/2020  Height 5\' 4"  - 5' 4.4"  Weight 150 lbs 13 oz - 150 lbs 13 oz  BMI 72.09 - 47.09  Systolic 628 366 294  Diastolic 80 92 81  Pulse 84 89 85    Physical Exam Constitutional:      General: She is not in acute distress.    Appearance: She is well-developed.  Neck:     Thyroid: No thyromegaly.     Vascular: No carotid bruit or JVD.  Cardiovascular:     Rate and Rhythm: Normal rate and regular rhythm.  Pulses: Normal pulses and intact distal pulses.     Heart sounds: Murmur heard.  Early systolic murmur is present with a grade of 2/6 at the lower left sternal border.    No gallop.  Pulmonary:     Effort: Pulmonary effort is normal.     Breath sounds: Normal breath sounds.  Abdominal:     General: Bowel sounds are normal.     Palpations: Abdomen is soft.  Musculoskeletal:        General: No swelling.   Radiology: No results found.  Laboratory examination:    CMP Latest Ref Rng & Units 04/14/2020 01/09/2020 07/09/2019  Glucose 65 - 99 mg/dL 107(H) 103(H) 102(H)  BUN 8 - 27 mg/dL 14 14 17   Creatinine 0.57 - 1.00 mg/dL 0.88 0.96 0.98  Sodium 134 - 144 mmol/L 141 139 139  Potassium 3.5 - 5.2 mmol/L 3.8 4.3 4.2  Chloride 96 - 106 mmol/L 102 100 102  CO2 20 - 29 mmol/L 21 25 22    Calcium 8.7 - 10.3 mg/dL 9.9 10.0 9.7  Total Protein 6.0 - 8.5 g/dL - 7.4 -  Total Bilirubin 0.0 - 1.2 mg/dL - 0.7 -  Alkaline Phos 44 - 121 IU/L - 66 -  AST 0 - 40 IU/L - 19 -  ALT 0 - 32 IU/L - 18 -   CBC Latest Ref Rng & Units 01/09/2020 01/08/2019 07/05/2018  WBC 3.4 - 10.8 x10E3/uL 4.6 5.5 5.5  Hemoglobin 11.1 - 15.9 g/dL 13.7 13.6 13.5  Hematocrit 34.0 - 46.6 % 41.6 40.4 39.7  Platelets 150 - 450 x10E3/uL 239 207 217   Lipid Panel     Component Value Date/Time   CHOL 206 (H) 04/14/2020 1112   TRIG 70 04/14/2020 1112   HDL 62 04/14/2020 1112   CHOLHDL 3.3 04/14/2020 1112   LDLCALC 131 (H) 04/14/2020 1112   HEMOGLOBIN A1C Lab Results  Component Value Date   HGBA1C 6.2 (H) 04/14/2020   TSH No results for input(s): TSH in the last 8760 hours.  Cardiac Studies:   Nuclear stress test  [06-25-15]: 1. The resting electrocardiogram demonstrated normal sinus rhythm, RBBB and no resting arrhythmias. The stress electrocardiogram was normal. Patient exercised on Bruce protocol for 5:03 minutes and achieved 7.05 METS. Stress test terminated due to target heart rate( 87% MPHR) and dyspnea. 2. Myocardial perfusion imaging is normal. Overall left ventricular systolic function was normal without regional wall motion abnormalities. The left ventricular ejection fraction was 80%.  Renal Dopplers  [05/13/2015]: No evidence of renal artery occlusive disease in either renal artery. Normal intrarenal vascular perfusion is noted in both kidneys. Mild increase in the echogenicity of both kidneys suggest medico-renal disease. Normal sized kidneys. Compared to the study done on 01/09/14, right renal artery stenosis not evident in the present study.  Renal Dopplers  [01/09/2014]: Renal artery duplex: Hemodynamically significant stenosis of the right renal artery. Normal intrarenal vascular perfusion is noted in both kidneys. < 50% stenosis of the left renal artery ostium. Bilateral renal size is  normal.  Echocardiogram 07/31/2018: Normal LV systolic function with EF 56%. Left ventricle cavity is normal in size. Moderate concentric hypertrophy of the left ventricle. Normal global wall motion. Doppler evidence of grade I (impaired) diastolic dysfunction, normal LAP. Calculated EF 56%. Left atrial cavity is mildly dilated. Mild (Grade I) mitral regurgitation. Mild to moderate tricuspid regurgitation. Estimated pulmonary artery systolic pressure 24 mmHg, which is normal.  No significant change compared to previous study in 2015.  EKG:   EKG 08/27/2020: Normal sinus rhythm at rate of 80 bpm, normal axis, right bundle branch block.  No evidence of ischemia.   No significant change from EKG 08/21/2019.     Assessment     ICD-10-CM   1. Essential hypertension  I10 EKG 12-Lead    verapamil (CALAN-SR) 180 MG CR tablet    2. Mild hypercholesterolemia  E78.00     3. Dyspnea on exertion  R06.00       Recommendations:    Ms. Shakaya Bhullar is a 75 y.o. Afro-American female with difficult control hypertension, diet-controlled diabetes, chronic shortness breath and hypertension with hypertensive heart.  She does not want to be on statins, she is also very particular about the medications.  She has not had any further chest pain, dyspnea has remained stable.  On her last office visit had added verapamil for hypertension, she is tolerating this without side effects but discontinued this.  Today her blood pressure is elevated, will refill verapamil however will increase from 120 mg to 180 mg daily.  I reviewed her labs, with weight loss her lipids are improved but in view of prediabetes, I still suggest that she should be on a low-dose moderate intensity or high intensity statin.  Patient does not want to be on statins for now.  Otherwise she remains stable from cardiac standpoint, I will see her back  in 3 months at her request. I reviewed her external labs. She is very reluctant to making  changes to her medications   Adrian Prows, MD, [facility] 09/04/2020, 12:13 AM Office: [phone] Fax: [phone] Pager: [phone]

## 2020-09-03 ENCOUNTER — Ambulatory Visit (INDEPENDENT_AMBULATORY_CARE_PROVIDER_SITE_OTHER): Payer: Medicare Other | Admitting: Nurse Practitioner

## 2020-09-03 ENCOUNTER — Encounter: Payer: Self-pay | Admitting: Nurse Practitioner

## 2020-09-03 ENCOUNTER — Other Ambulatory Visit: Payer: Self-pay

## 2020-09-03 VITALS — BP 124/80 | HR 84 | Temp 98.5°F | Ht 64.0 in | Wt 150.8 lb

## 2020-09-03 DIAGNOSIS — R7303 Prediabetes: Secondary | ICD-10-CM | POA: Diagnosis not present

## 2020-09-03 DIAGNOSIS — R109 Unspecified abdominal pain: Secondary | ICD-10-CM

## 2020-09-03 DIAGNOSIS — Z23 Encounter for immunization: Secondary | ICD-10-CM

## 2020-09-03 DIAGNOSIS — I10 Essential (primary) hypertension: Secondary | ICD-10-CM

## 2020-09-03 DIAGNOSIS — E7800 Pure hypercholesterolemia, unspecified: Secondary | ICD-10-CM

## 2020-09-03 LAB — POCT URINALYSIS DIPSTICK
Bilirubin, UA: NEGATIVE
Blood, UA: NEGATIVE
Glucose, UA: NEGATIVE
Ketones, UA: NEGATIVE
Leukocytes, UA: NEGATIVE
Nitrite, UA: NEGATIVE
Protein, UA: NEGATIVE
Spec Grav, UA: 1.025 (ref 1.010–1.025)
Urobilinogen, UA: 0.2 E.U./dL
pH, UA: 5.5 (ref 5.0–8.0)

## 2020-09-03 MED ORDER — SHINGRIX 50 MCG/0.5ML IM SUSR: 0.5000 mL | Freq: Once | INTRAMUSCULAR | 0 refills | Status: AC

## 2020-09-03 MED ORDER — TETANUS-DIPHTH-ACELL PERTUSSIS 5-2.5-18.5 LF-MCG/0.5 IM SUSP: 0.5000 mL | Freq: Once | INTRAMUSCULAR | 0 refills | Status: AC

## 2020-09-03 NOTE — Progress Notes (Signed)
I,Yamilka Roman [employer] as a Education administrator for [employer], FNP.,have documented all relevant documentation on the behalf of Minette Brine, FNP,as directed by  Minette Brine, FNP while in the presence of Minette Brine, [place].  This visit occurred during the SARS-CoV-2 public health emergency.  Safety protocols were in place, including screening questions prior to the visit, additional usage of staff PPE, and extensive cleaning of exam room while observing appropriate contact time as indicated for disinfecting solutions.  Subjective:     Patient ID: Julia Gates , female    DOB: 1945-07-26 , 75 y.o.   MRN: [id number]   Chief Complaint  Patient presents with   Hypertension   Prediabetes    HPI  Patient presents today for a blood pressure f/u.  She seen Dr. Einar Gip 08/27/2020. Dr. Einar Gip increased her dose of Verapamil, she is to follow up with him in 3 months.      Hypertension This is a chronic problem. The current episode started more than 1 year ago. The problem is unchanged. The problem is controlled. Pertinent negatives include no anxiety, chest pain or palpitations. There are no associated agents to hypertension. There are no known risk factors for coronary artery disease. Past treatments include angiotensin blockers and diuretics. There are no compliance problems.  There is no history of angina. There is no history of chronic renal disease.  Diabetes She presents for her follow-up diabetic visit. Diabetes type: prediabetes. There are no hypoglycemic associated symptoms. Pertinent negatives for diabetes include no chest pain, no fatigue, no polydipsia, no polyphagia and no polyuria. There are no hypoglycemic complications. There are no diabetic complications. Risk factors for coronary artery disease include sedentary lifestyle and hypertension. When asked about current treatments, none were reported. She is following a generally healthy diet. When asked about meal planning, she reported none.  She has not had a previous visit with a dietitian. She never participates in exercise. There is no change in her home blood glucose trend. An ACE inhibitor/angiotensin II receptor blocker is being taken. She does not see a podiatrist.Eye exam is current.    Past Medical History:  Diagnosis Date   Abnormal glucose level    High cholesterol    Hypertension    Lumbago    Sciatica    Vitamin D deficiency      Family History  Problem Relation Age of Onset   Heart disease Mother    Diabetes Mother    Diabetes Brother    Diabetes Sister      Current Outpatient Medications:    Ascorbic Acid (VITAMIN C) 100 MG tablet, Take 100 mg by mouth as needed., Disp: , Rfl:    cholecalciferol (VITAMIN D3) 25 MCG (1000 UT) tablet, Take 1,000 Units by mouth daily., Disp: , Rfl:    co-enzyme Q-10 30 MG capsule, Take 30 mg by mouth daily., Disp: , Rfl:    Multiple Vitamin (MULTIVITAMIN) tablet, Take 1 tablet by mouth daily., Disp: , Rfl:    NON FORMULARY, Green protein drink, Disp: , Rfl:    NON FORMULARY, Black seed drink, Disp: , Rfl:    Omega-3-6-9 CAPS, Take by mouth., Disp: , Rfl:    spironolactone (ALDACTONE) 25 MG tablet, Take 1 tablet (25 mg total) by mouth daily., Disp: 90 tablet, Rfl: 1   valsartan-hydrochlorothiazide (DIOVAN-HCT) 320-25 MG tablet, Take 1 tablet by mouth daily., Disp: 90 tablet, Rfl: 1   verapamil (CALAN-SR) 180 MG CR tablet, Take 1 tablet (180 mg total) by mouth at bedtime., Disp:  90 tablet, Rfl: 3   Allergies  Allergen Reactions   Penicillins Rash     Review of Systems  Constitutional: Negative.  Negative for fatigue.  Respiratory: Negative.    Cardiovascular:  Negative for chest pain, palpitations and leg swelling.  Gastrointestinal:  Positive for abdominal pain (dull). Negative for constipation, diarrhea, nausea and vomiting.  Endocrine: Negative for polydipsia, polyphagia and polyuria.  Psychiatric/Behavioral: Negative.      Today's Vitals   09/03/20 0934  BP:  124/80  Pulse: 84  Temp: 98.5 F (36.9 C)  Weight: 150 lb 12.8 oz (68.4 kg)  Height: 5' 4"  (1.626 m)  PainSc: 0-No pain   Body mass index is 25.88 kg/m.   Objective:  Physical Exam Vitals reviewed.  Constitutional:      General: She is not in acute distress.    Appearance: Normal appearance. She is normal weight.  Cardiovascular:     Rate and Rhythm: Normal rate and regular rhythm.     Pulses: Normal pulses.     Heart sounds: Normal heart sounds. No murmur heard. Pulmonary:     Effort: Pulmonary effort is normal. No respiratory distress.     Breath sounds: Normal breath sounds. No wheezing.  Skin:    General: Skin is warm and dry.     Capillary Refill: Capillary refill takes less than 2 seconds.     Coloration: Skin is not jaundiced.  Neurological:     General: No focal deficit present.     Mental Status: She is alert and oriented to person, place, and time.     Cranial Nerves: No cranial nerve deficit.     Motor: No weakness.  Psychiatric:        Mood and Affect: Mood normal.        Behavior: Behavior normal.        Thought Content: Thought content normal.        Judgment: Judgment normal.        Assessment And Plan:     1. Essential hypertension Comments: Blood pressure is well controlled Continue with current medications Continue f/u with Dr. Einar Gip - CMP14+EGFR  2. Prediabetes Comments: Stable, will check HgbA1c - Hemoglobin A1c  3. Elevated cholesterol Comments: Stable, not currently on statin, she is hesitant to taking medication - Lipid panel  4. Encounter for immunization - Tdap ([place]) 5-2.5-18.5 LF-MCG/0.5 injection; Inject 0.5 mLs into the muscle once for 1 dose.  Dispense: 0.5 mL; Refill: 0 - Zoster Vaccine Adjuvanted [facility]) injection; Inject 0.5 mLs into the muscle once for 1 dose.  Dispense: 1 each; Refill: 0  5. Stomach ache - POCT Urinalysis Dipstick (46190)    Patient was given opportunity to ask questions. Patient verbalized  understanding of the plan and was able to repeat key elements of the plan. All questions were answered to their satisfaction.  Minette Brine, FNP   I, Minette Brine, FNP, have reviewed all documentation for this visit. The documentation on 09/30/20 for the exam, diagnosis, procedures, and orders are all accurate and complete.   IF YOU HAVE BEEN REFERRED TO A SPECIALIST, IT MAY TAKE 1-2 WEEKS TO SCHEDULE/PROCESS THE REFERRAL. IF YOU HAVE NOT HEARD FROM US/SPECIALIST IN TWO WEEKS, PLEASE GIVE [place] A CALL AT [phone].   THE PATIENT IS ENCOURAGED TO PRACTICE SOCIAL DISTANCING DUE TO THE COVID-19 PANDEMIC.

## 2020-09-03 NOTE — Patient Instructions (Signed)

## 2020-09-04 LAB — HEMOGLOBIN A1C
Est. average glucose Bld gHb Est-mCnc: 137 mg/dL
Hgb A1c MFr Bld: 6.4 % — ABNORMAL HIGH (ref 4.8–5.6)

## 2020-09-04 LAB — LIPID PANEL
Chol/HDL Ratio: 3 ratio (ref 0.0–4.4)
Cholesterol, Total: 190 mg/dL (ref 100–199)
HDL: 63 mg/dL (ref >39)
LDL Chol Calc (NIH): 115 mg/dL — ABNORMAL HIGH (ref 0–99)
Triglycerides: 67 mg/dL (ref 0–149)
VLDL Cholesterol Cal: 12 mg/dL (ref 5–40)

## 2020-09-04 LAB — CMP14+EGFR
ALT: 16 IU/L (ref 0–32)
AST: 17 IU/L (ref 0–40)
Albumin/Globulin Ratio: 2 (ref 1.2–2.2)
Albumin: 4.6 g/dL (ref 3.7–4.7)
Alkaline Phosphatase: 57 IU/L (ref 44–121)
BUN/Creatinine Ratio: 28 (ref 12–28)
BUN: 25 mg/dL (ref 8–27)
Bilirubin Total: 0.5 mg/dL (ref 0.0–1.2)
CO2: 25 mmol/L (ref 20–29)
Calcium: 9.7 mg/dL (ref 8.7–10.3)
Chloride: 102 mmol/L (ref 96–106)
Creatinine, Ser: 0.89 mg/dL (ref 0.57–1.00)
Globulin, Total: 2.3 g/dL (ref 1.5–4.5)
Glucose: 92 mg/dL (ref 65–99)
Potassium: 4.2 mmol/L (ref 3.5–5.2)
Sodium: 138 mmol/L (ref 134–144)
Total Protein: 6.9 g/dL (ref 6.0–8.5)
eGFR: 68 mL/min/{1.73_m2} (ref >59)

## 2020-11-04 ENCOUNTER — Telehealth: Payer: Self-pay | Admitting: Nurse Practitioner

## 2020-11-04 NOTE — Telephone Encounter (Signed)
Left message asking patient to call I gave both office # and my number [phone]  Patient has AWV 01/14/21 I wanted to see if we could move appt sooner  [facility] does calendar year

## 2020-12-01 ENCOUNTER — Other Ambulatory Visit: Payer: Self-pay

## 2020-12-01 ENCOUNTER — Encounter: Payer: Self-pay | Admitting: Nurse Practitioner

## 2020-12-01 ENCOUNTER — Ambulatory Visit (INDEPENDENT_AMBULATORY_CARE_PROVIDER_SITE_OTHER): Payer: Medicare Other | Admitting: Nurse Practitioner

## 2020-12-01 VITALS — BP 110/78 | HR 78 | Temp 98.9°F | Ht 64.0 in | Wt 155.4 lb

## 2020-12-01 DIAGNOSIS — R7303 Prediabetes: Secondary | ICD-10-CM | POA: Diagnosis not present

## 2020-12-01 DIAGNOSIS — E7800 Pure hypercholesterolemia, unspecified: Secondary | ICD-10-CM | POA: Diagnosis not present

## 2020-12-01 DIAGNOSIS — I10 Essential (primary) hypertension: Secondary | ICD-10-CM

## 2020-12-01 DIAGNOSIS — Z23 Encounter for immunization: Secondary | ICD-10-CM

## 2020-12-01 LAB — LIPID PANEL
Chol/HDL Ratio: 3.7 ratio (ref 0.0–4.4)
Cholesterol, Total: 212 mg/dL — ABNORMAL HIGH (ref 100–199)
HDL: 57 mg/dL (ref >39)
LDL Chol Calc (NIH): 138 mg/dL — ABNORMAL HIGH (ref 0–99)
Triglycerides: 96 mg/dL (ref 0–149)
VLDL Cholesterol Cal: 17 mg/dL (ref 5–40)

## 2020-12-01 LAB — BMP8+EGFR
BUN/Creatinine Ratio: 17 (ref 12–28)
BUN: 19 mg/dL (ref 8–27)
CO2: 24 mmol/L (ref 20–29)
Calcium: 10.1 mg/dL (ref 8.7–10.3)
Chloride: 100 mmol/L (ref 96–106)
Creatinine, Ser: 1.11 mg/dL — ABNORMAL HIGH (ref 0.57–1.00)
Glucose: 102 mg/dL — ABNORMAL HIGH (ref 70–99)
Potassium: 4.4 mmol/L (ref 3.5–5.2)
Sodium: 141 mmol/L (ref 134–144)
eGFR: 52 mL/min/{1.73_m2} — ABNORMAL LOW (ref >59)

## 2020-12-01 LAB — HEMOGLOBIN A1C
Est. average glucose Bld gHb Est-mCnc: 137 mg/dL
Hgb A1c MFr Bld: 6.4 % — ABNORMAL HIGH (ref 4.8–5.6)

## 2020-12-01 MED ORDER — SHINGRIX 50 MCG/0.5ML IM SUSR: 0.5000 mL | Freq: Once | INTRAMUSCULAR | 0 refills | Status: AC

## 2020-12-01 MED ORDER — TETANUS-DIPHTH-ACELL PERTUSSIS 5-2.5-18.5 LF-MCG/0.5 IM SUSP: 0.5000 mL | Freq: Once | INTRAMUSCULAR | 0 refills | Status: AC

## 2020-12-01 NOTE — Progress Notes (Signed)
I, Azalee Course acting as a Education administrator for [employer], FNP.,have documented all relevant documentation on the behalf of Minette Brine, FNP,as directed by  Minette Brine, FNP while in the presence of Minette Brine, [place].   This visit occurred during the SARS-CoV-2 public health emergency.  Safety protocols were in place, including screening questions prior to the visit, additional usage of staff PPE, and extensive cleaning of exam room while observing appropriate contact time as indicated for disinfecting solutions.  Subjective:     Patient ID: Julia Gates , female    DOB: 1945/03/11 , 75 y.o.   MRN: [id number]   Chief Complaint  Patient presents with   Hypertension    HPI  Pt here for BP f/u. No current concerns. She is now taking a fiber tablet.     Past Medical History:  Diagnosis Date   Abnormal glucose level    High cholesterol    Hypertension    Lumbago    Sciatica    Vitamin D deficiency      Family History  Problem Relation Age of Onset   Heart disease Mother    Diabetes Mother    Diabetes Brother    Diabetes Sister      Current Outpatient Medications:    Ascorbic Acid (VITAMIN C) 100 MG tablet, Take 100 mg by mouth as needed., Disp: , Rfl:    cholecalciferol (VITAMIN D3) 25 MCG (1000 UT) tablet, Take 1,000 Units by mouth daily., Disp: , Rfl:    co-enzyme Q-10 30 MG capsule, Take 30 mg by mouth daily., Disp: , Rfl:    Multiple Vitamin (MULTIVITAMIN) tablet, Take 1 tablet by mouth daily., Disp: , Rfl:    NON FORMULARY, Green protein drink, Disp: , Rfl:    NON FORMULARY, Black seed drink, Disp: , Rfl:    Omega-3-6-9 CAPS, Take by mouth., Disp: , Rfl:    spironolactone (ALDACTONE) 25 MG tablet, Take 1 tablet (25 mg total) by mouth daily., Disp: 90 tablet, Rfl: 1   valsartan-hydrochlorothiazide (DIOVAN-HCT) 320-25 MG tablet, Take 1 tablet by mouth daily., Disp: 90 tablet, Rfl: 1   verapamil (CALAN-SR) 180 MG CR tablet, Take 1 tablet (180 mg total) by mouth at  bedtime., Disp: 90 tablet, Rfl: 3   Allergies  Allergen Reactions   Penicillins Rash     Review of Systems  Constitutional: Negative.   Respiratory: Negative.    Cardiovascular: Negative.   Neurological: Negative.   Psychiatric/Behavioral: Negative.      Today's Vitals   12/01/20 0938  BP: 110/78  Pulse: 78  Temp: 98.9 F (37.2 C)  Weight: 155 lb 6.4 oz (70.5 kg)  Height: 5' 4"  (1.626 m)  PainSc: 0-No pain   Body mass index is 26.67 kg/m.  Wt Readings from Last 3 Encounters:  12/01/20 155 lb 6.4 oz (70.5 kg)  09/03/20 150 lb 12.8 oz (68.4 kg)  08/27/20 150 lb 12.8 oz (68.4 kg)    Objective:  Physical Exam Vitals reviewed.  Constitutional:      General: She is not in acute distress.    Appearance: Normal appearance. She is normal weight.  Cardiovascular:     Rate and Rhythm: Normal rate and regular rhythm.     Pulses: Normal pulses.     Heart sounds: Normal heart sounds. No murmur heard. Pulmonary:     Effort: Pulmonary effort is normal. No respiratory distress.     Breath sounds: Normal breath sounds. No wheezing.  Skin:    General: Skin is  warm and dry.     Capillary Refill: Capillary refill takes less than 2 seconds.     Coloration: Skin is not jaundiced.  Neurological:     General: No focal deficit present.     Mental Status: She is alert and oriented to person, place, and time.     Cranial Nerves: No cranial nerve deficit.     Motor: No weakness.  Psychiatric:        Mood and Affect: Mood normal.        Behavior: Behavior normal.        Thought Content: Thought content normal.        Judgment: Judgment normal.        Assessment And Plan:     1. Essential hypertension Comments: Blood pressure is well controlled Continue current medications - BMP8+eGFR  2. Prediabetes Comments: Stable, she is focusing on healthy diet and increase fiber intake - Hemoglobin A1c - BMP8+eGFR  3. Elevated cholesterol Comments: Slightly increased at last visit,  continue with fiber intake. - Lipid panel  4. Encounter for immunization Rx's faxed to pharmacy - Zoster Vaccine Adjuvanted [facility]) injection; Inject 0.5 mLs into the muscle once for 1 dose.  Dispense: 0.5 mL; Refill: 0 - Tdap ([place]) 5-2.5-18.5 LF-MCG/0.5 injection; Inject 0.5 mLs into the muscle once for 1 dose.  Dispense: 0.5 mL; Refill: 0    Patient was given opportunity to ask questions. Patient verbalized understanding of the plan and was able to repeat key elements of the plan. All questions were answered to their satisfaction.  Minette Brine, FNP   I, Minette Brine, FNP, have reviewed all documentation for this visit. The documentation on 12/02/20 for the exam, diagnosis, procedures, and orders are all accurate and complete.   IF YOU HAVE BEEN REFERRED TO A SPECIALIST, IT MAY TAKE 1-2 WEEKS TO SCHEDULE/PROCESS THE REFERRAL. IF YOU HAVE NOT HEARD FROM US/SPECIALIST IN TWO WEEKS, PLEASE GIVE [place] A CALL AT [phone].   THE PATIENT IS ENCOURAGED TO PRACTICE SOCIAL DISTANCING DUE TO THE COVID-19 PANDEMIC.

## 2020-12-01 NOTE — Patient Instructions (Signed)

## 2020-12-08 ENCOUNTER — Ambulatory Visit: Payer: Medicare Other | Admitting: Nurse Practitioner

## 2020-12-10 ENCOUNTER — Other Ambulatory Visit: Payer: Self-pay

## 2020-12-10 ENCOUNTER — Ambulatory Visit: Payer: Medicare Other | Admitting: Cardiology

## 2020-12-10 ENCOUNTER — Encounter: Payer: Self-pay | Admitting: Cardiology

## 2020-12-10 VITALS — BP 136/76 | HR 76 | Temp 97.6°F | Resp 16 | Ht 64.0 in | Wt 155.2 lb

## 2020-12-10 DIAGNOSIS — I10 Essential (primary) hypertension: Secondary | ICD-10-CM

## 2020-12-10 NOTE — Progress Notes (Signed)
Primary Physician/Referring:  Minette Brine, FNP  Patient ID: Julia Gates, female    DOB: [birth year], 75 y.o.   MRN: [id number]  Chief Complaint  Patient presents with   Hypertension   Follow-up    3 months    HPI: Julia Gates  is a 75 y.o. female  with difficult control hypertension, diet-controlled diabetes, chronic shortness breath and hypertension with hypertensive heart disease, is very particular with need to taking her medications, does not want to be on statins.   She presents to the office for follow-up of hypertension, states that she is doing well and has noticed significant improvement in blood pressure since last office visit and would like to continue the medication.  Denies chest pain or dyspnea. Past Medical History:  Diagnosis Date   Abnormal glucose level    High cholesterol    Hypertension    Lumbago    Sciatica    Vitamin D deficiency     Past Surgical History:  Procedure Laterality Date   CATARACT EXTRACTION Right 12/2018   CATARACT EXTRACTION, BILATERAL  01/2019   TUBAL LIGATION     age 38   Social History   Tobacco Use   Smoking status: Never   Smokeless tobacco: Never  Substance Use Topics   Alcohol use: No   Marital Status: Married   Current Outpatient Medications on File Prior to Visit  Medication Sig Dispense Refill   Ascorbic Acid (VITAMIN C) 100 MG tablet Take 100 mg by mouth as needed.     cholecalciferol (VITAMIN D3) 25 MCG (1000 UT) tablet Take 1,000 Units by mouth daily.     co-enzyme Q-10 30 MG capsule Take 30 mg by mouth daily.     Multiple Vitamin (MULTIVITAMIN) tablet Take 1 tablet by mouth daily.     NON FORMULARY Green protein drink     NON FORMULARY Black seed drink     Omega-3-6-9 CAPS Take by mouth.     spironolactone (ALDACTONE) 25 MG tablet Take 1 tablet (25 mg total) by mouth daily. 90 tablet 1   valsartan-hydrochlorothiazide (DIOVAN-HCT) 320-25 MG tablet Take 1 tablet by mouth daily. 90 tablet 1   verapamil  (CALAN-SR) 180 MG CR tablet Take 1 tablet (180 mg total) by mouth at bedtime. 90 tablet 3   No current facility-administered medications on file prior to visit.   Review of Systems  Cardiovascular:  Negative for chest pain, dyspnea on exertion and leg swelling.  Musculoskeletal:  Positive for joint pain.  Gastrointestinal:  Negative for melena.  Neurological:  Positive for paresthesias.     Objective  Blood pressure 136/76, pulse 76, temperature 97.6 F (36.4 C), temperature source Temporal, resp. rate 16, height 5\' 4"  (1.626 m), weight 155 lb 3.2 oz (70.4 kg), SpO2 98 %. Body mass index is 26.64 kg/m.   Vitals with BMI 12/10/2020 12/01/2020 09/03/2020  Height 5\' 4"  5\' 4"  5\' 4"   Weight 155 lbs 3 oz 155 lbs 6 oz 150 lbs 13 oz  BMI 26.63 02.58 52.77  Systolic 824 235 361  Diastolic 76 78 80  Pulse 76 78 84    Physical Exam Constitutional:      Appearance: She is well-developed.  Neck:     Thyroid: No thyromegaly.     Vascular: No carotid bruit or JVD.  Cardiovascular:     Rate and Rhythm: Normal rate and regular rhythm.     Pulses: Normal pulses and intact distal pulses.     Heart sounds: Murmur heard.  Early systolic murmur is present with a grade of 2/6 at the lower left sternal border.    No gallop.  Pulmonary:     Effort: Pulmonary effort is normal.     Breath sounds: Normal breath sounds.  Abdominal:     General: Bowel sounds are normal.     Palpations: Abdomen is soft.  Musculoskeletal:     Right lower leg: No edema.     Left lower leg: No edema.   Radiology: No results found.  Laboratory examination:    CMP Latest Ref Rng & Units 12/01/2020 09/03/2020 04/14/2020  Glucose 70 - 99 mg/dL 102(H) 92 107(H)  BUN 8 - 27 mg/dL 19 25 14   Creatinine 0.57 - 1.00 mg/dL 1.11(H) 0.89 0.88  Sodium 134 - 144 mmol/L 141 138 141  Potassium 3.5 - 5.2 mmol/L 4.4 4.2 3.8  Chloride 96 - 106 mmol/L 100 102 102  CO2 20 - 29 mmol/L 24 25 21   Calcium 8.7 - 10.3 mg/dL 10.1 9.7 9.9   Total Protein 6.0 - 8.5 g/dL - 6.9 -  Total Bilirubin 0.0 - 1.2 mg/dL - 0.5 -  Alkaline Phos 44 - 121 IU/L - 57 -  AST 0 - 40 IU/L - 17 -  ALT 0 - 32 IU/L - 16 -   CBC Latest Ref Rng & Units 01/09/2020 01/08/2019 07/05/2018  WBC 3.4 - 10.8 x10E3/uL 4.6 5.5 5.5  Hemoglobin 11.1 - 15.9 g/dL 13.7 13.6 13.5  Hematocrit 34.0 - 46.6 % 41.6 40.4 39.7  Platelets 150 - 450 x10E3/uL 239 207 217   Lipid Panel     Component Value Date/Time   CHOL 212 (H) 12/01/2020 1100   TRIG 96 12/01/2020 1100   HDL 57 12/01/2020 1100   CHOLHDL 3.7 12/01/2020 1100   LDLCALC 138 (H) 12/01/2020 1100   HEMOGLOBIN A1C Lab Results  Component Value Date   HGBA1C 6.4 (H) 12/01/2020   TSH No results for input(s): TSH in the last 8760 hours.  Cardiac Studies:   Nuclear stress test  [June 19, 2015]: 1. The resting electrocardiogram demonstrated normal sinus rhythm, RBBB and no resting arrhythmias. The stress electrocardiogram was normal. Patient exercised on Bruce protocol for 5:03 minutes and achieved 7.05 METS. Stress test terminated due to target heart rate( 87% MPHR) and dyspnea. 2. Myocardial perfusion imaging is normal. Overall left ventricular systolic function was normal without regional wall motion abnormalities. The left ventricular ejection fraction was 80%.  Renal Dopplers  [05/13/2015]: No evidence of renal artery occlusive disease in either renal artery. Normal intrarenal vascular perfusion is noted in both kidneys. Mild increase in the echogenicity of both kidneys suggest medico-renal disease. Normal sized kidneys. Compared to the study done on 01/09/14, right renal artery stenosis not evident in the present study.  Renal Dopplers  [01/09/2014]: Renal artery duplex: Hemodynamically significant stenosis of the right renal artery. Normal intrarenal vascular perfusion is noted in both kidneys. < 50% stenosis of the left renal artery ostium. Bilateral renal size is normal.  Echocardiogram  07/31/2018: Normal LV systolic function with EF 56%. Left ventricle cavity is normal in size. Moderate concentric hypertrophy of the left ventricle. Normal global wall motion. Doppler evidence of grade I (impaired) diastolic dysfunction, normal LAP. Calculated EF 56%. Left atrial cavity is mildly dilated. Mild (Grade I) mitral regurgitation. Mild to moderate tricuspid regurgitation. Estimated pulmonary artery systolic pressure 24 mmHg, which is normal.  No significant change compared to previous study in 2015.  EKG:   EKG 08/27/2020: Normal sinus rhythm  at rate of 80 bpm, normal axis, right bundle branch block.  No evidence of ischemia.   No significant change from EKG 08/21/2019.     Assessment     ICD-10-CM   1. Essential hypertension  I10       Recommendations:    Julia Gates is a 75 y.o. Afro-American female with difficult control hypertension, diet-controlled diabetes, chronic shortness breath and hypertension with hypertensive heart.  She does not want to be on statins, she is also very particular about the medications.  I am seeing her upon her request.  Today her blood pressure is very well controlled, she remains asymptomatic except for joint pains and paresthesias.  States that she is tolerating verapamil well and would like to continue taking the medication.  She has not had any further chest pain or dyspnea on exertion.  I simply reassured her.  No changes were done, I will see her back in 6 months for follow-up.    Adrian Prows, MD, [facility] 12/10/2020, 4:02 PM Office: [phone] Fax: [phone] Pager: [phone]

## 2020-12-22 ENCOUNTER — Other Ambulatory Visit: Payer: Self-pay

## 2020-12-22 DIAGNOSIS — I10 Essential (primary) hypertension: Secondary | ICD-10-CM

## 2020-12-22 MED ORDER — SPIRONOLACTONE 25 MG PO TABS: 25.0000 mg | ORAL_TABLET | Freq: Every day | ORAL | 1 refills | Status: DC

## 2020-12-22 MED ORDER — VALSARTAN-HYDROCHLOROTHIAZIDE 320-25 MG PO TABS: 1.0000 | ORAL_TABLET | Freq: Every day | ORAL | 1 refills | Status: DC

## 2020-12-23 ENCOUNTER — Other Ambulatory Visit: Payer: Self-pay

## 2020-12-23 DIAGNOSIS — I10 Essential (primary) hypertension: Secondary | ICD-10-CM

## 2020-12-23 MED ORDER — SPIRONOLACTONE 25 MG PO TABS: 25.0000 mg | ORAL_TABLET | Freq: Every day | ORAL | 1 refills | Status: DC

## 2020-12-23 MED ORDER — VALSARTAN-HYDROCHLOROTHIAZIDE 320-25 MG PO TABS: 1.0000 | ORAL_TABLET | Freq: Every day | ORAL | 1 refills | Status: DC

## 2021-01-14 ENCOUNTER — Other Ambulatory Visit: Payer: Self-pay

## 2021-01-14 ENCOUNTER — Ambulatory Visit (INDEPENDENT_AMBULATORY_CARE_PROVIDER_SITE_OTHER): Payer: Medicare Other

## 2021-01-14 ENCOUNTER — Ambulatory Visit: Payer: Medicare Other | Admitting: Nurse Practitioner

## 2021-01-14 ENCOUNTER — Ambulatory Visit: Payer: Medicare Other

## 2021-01-14 VITALS — BP 112/70 | HR 73 | Temp 98.0°F | Ht 64.0 in | Wt 159.2 lb

## 2021-01-14 DIAGNOSIS — Z23 Encounter for immunization: Secondary | ICD-10-CM | POA: Diagnosis not present

## 2021-01-14 DIAGNOSIS — Z0000 Encounter for general adult medical examination without abnormal findings: Secondary | ICD-10-CM | POA: Diagnosis not present

## 2021-01-14 MED ORDER — BOOSTRIX 5-2.5-18.5 LF-MCG/0.5 IM SUSP: 0.5000 mL | Freq: Once | INTRAMUSCULAR | 0 refills | Status: AC

## 2021-01-14 NOTE — Progress Notes (Signed)
This visit occurred during the SARS-CoV-2 public health emergency.  Safety protocols were in place, including screening questions prior to the visit, additional usage of staff PPE, and extensive cleaning of exam room while observing appropriate contact time as indicated for disinfecting solutions.  Subjective:   Julia Gates is a 75 y.o. female who presents for Medicare Annual (Subsequent) preventive examination.  Review of Systems     Cardiac Risk Factors include: advanced age (>47men, >31 women);hypertension;sedentary lifestyle     Objective:    Today's Vitals   01/14/21 0938  BP: 112/70  Pulse: 73  Temp: 98 F (36.7 C)  TempSrc: Oral  SpO2: 94%  Weight: 159 lb 3.2 oz (72.2 kg)  Height: 5\' 4"  (1.626 m)   Body mass index is 27.33 kg/m.  Advanced Directives 01/14/2021 01/09/2020 01/08/2019 12/28/2017  Does Patient Have a Medical Advance Directive? No No No No  Would patient like information on creating a medical advance directive? Yes (MAU/Ambulatory/Procedural Areas - Information given) Yes (MAU/Ambulatory/Procedural Areas - Information given) - Yes (MAU/Ambulatory/Procedural Areas - Information given)    Current Medications (verified) Outpatient Encounter Medications as of 01/14/2021  Medication Sig   Ascorbic Acid (VITAMIN C) 100 MG tablet Take 100 mg by mouth as needed.   cholecalciferol (VITAMIN D3) 25 MCG (1000 UT) tablet Take 1,000 Units by mouth daily.   co-enzyme Q-10 30 MG capsule Take 30 mg by mouth daily.   Multiple Vitamin (MULTIVITAMIN) tablet Take 1 tablet by mouth daily.   NON FORMULARY Green protein drink   NON FORMULARY Black seed drink   Omega-3-6-9 CAPS Take by mouth.   spironolactone (ALDACTONE) 25 MG tablet Take 1 tablet (25 mg total) by mouth daily.   Tdap (BOOSTRIX) 5-2.5-18.5 LF-MCG/0.5 injection Inject 0.5 mLs into the muscle once for 1 dose.   valsartan-hydrochlorothiazide (DIOVAN-HCT) 320-25 MG tablet Take 1 tablet by mouth daily.   verapamil  (CALAN-SR) 180 MG CR tablet Take 1 tablet (180 mg total) by mouth at bedtime.   No facility-administered encounter medications on file as of 01/14/2021.    Allergies (verified) Penicillins   History: Past Medical History:  Diagnosis Date   Abnormal glucose level    High cholesterol    Hypertension    Lumbago    Sciatica    Vitamin D deficiency    Past Surgical History:  Procedure Laterality Date   CATARACT EXTRACTION Right 12/2018   CATARACT EXTRACTION, BILATERAL  01/2019   TUBAL LIGATION     age 61   Family History  Problem Relation Age of Onset   Heart disease Mother    Diabetes Mother    Diabetes Brother    Diabetes Sister    Social History   Socioeconomic History   Marital status: Married    Spouse name: Not on file   Number of children: 3   Years of education: Not on file   Highest education level: Not on file  Occupational History   Occupation: retired  Tobacco Use   Smoking status: Never   Smokeless tobacco: Never  Vaping Use   Vaping Use: Never used  Substance and Sexual Activity   Alcohol use: No   Drug use: No   Sexual activity: Yes  Other Topics Concern   Not on file  Social History Narrative   Patient lives at home with her husband and she is retired. She has a high school education and 3 children   Social Determinants of Health   Financial Resource Strain: Low Risk  Difficulty of Paying Living Expenses: Not hard at all  Food Insecurity: No Food Insecurity   Worried About [place] in the Last Year: Never true   Ran Out of Food in the Last Year: Never true  Transportation Needs: No Transportation Needs   Lack of Transportation (Medical): No   Lack of Transportation (Non-Medical): No  Physical Activity: Inactive   Days of Exercise per Week: 0 days   Minutes of Exercise per Session: 0 min  Stress: No Stress Concern Present   Feeling of Stress : Not at all  Social Connections: Not on file    Tobacco Counseling Counseling  given: Not Answered   Clinical Intake:  Pre-visit preparation completed: Yes  Pain : No/denies pain     Nutritional Status: BMI 25 -29 Overweight Nutritional Risks: None Diabetes: No  How often do you need to have someone help you when you read instructions, pamphlets, or other written materials from your doctor or pharmacy?: 1 - Never What is the last grade level you completed in school?: 12th grade  Diabetic? no  Interpreter Needed?: No  Information entered by :: NAllen LPN   Activities of Daily Living In your present state of health, do you have any difficulty performing the following activities: 01/14/2021  Hearing? N  Vision? Y  Comment gets blurry at times  Difficulty concentrating or making decisions? N  Walking or climbing stairs? N  Dressing or bathing? N  Doing errands, shopping? N  Preparing Food and eating ? N  Using the Toilet? N  In the past six months, have you accidently leaked urine? Y  Do you have problems with loss of bowel control? N  Managing your Medications? N  Managing your Finances? N  Housekeeping or managing your Housekeeping? N  Some recent data might be hidden    Patient Care Team: Minette Brine, FNP as PCP - General (General Practice)  Indicate any recent Medical Services you may have received from other than [facility] providers in the past year (date may be approximate).     Assessment:   This is a routine wellness examination for [place].  Hearing/Vision screen Vision Screening - Comments:: Regular eye exams, [facility]  Dietary issues and exercise activities discussed: Current Exercise Habits: The patient does not participate in regular exercise at present   Goals Addressed             This Visit's Progress    Patient Stated       01/14/2021, get BP down without medication       Depression Screen PHQ 2/9 Scores 01/14/2021 01/09/2020 07/09/2019 01/08/2019 07/05/2018 04/04/2018 12/28/2017  PHQ - 2 Score 0 0 0 3 0 0 0   PHQ- 9 Score - 3 - 3 - - 1    Fall Risk Fall Risk  01/14/2021 01/09/2020 07/09/2019 01/08/2019 07/05/2018  Falls in the past year? 0 0 0 0 0  Number falls in past yr: - - - 0 -  Risk for fall due to : Medication side effect Medication side effect - Medication side effect -  Follow up Falls evaluation completed;Education provided;Falls prevention discussed Falls evaluation completed;Education provided;Falls prevention discussed - Falls evaluation completed;Education provided;Falls prevention discussed -    FALL RISK PREVENTION PERTAINING TO THE HOME:  Any stairs in or around the home? Yes  If so, are there any without handrails? No  Home free of loose throw rugs in walkways, pet beds, electrical cords, etc? Yes  Adequate lighting in  your home to reduce risk of falls? Yes   ASSISTIVE DEVICES UTILIZED TO PREVENT FALLS:  Life alert? No  Use of a cane, walker or w/c? No  Grab bars in the bathroom? Yes  Shower chair or bench in shower? Yes  Elevated toilet seat or a handicapped toilet? Yes   TIMED UP AND GO:  Was the test performed? No .    Gait steady and fast without use of assistive device  Cognitive Function:     6CIT Screen 01/14/2021 01/09/2020 01/08/2019 12/28/2017  What Year? 0 points 0 points 0 points 0 points  What month? 0 points 0 points 0 points 0 points  What time? 0 points 0 points 0 points 0 points  Count back from 20 0 points 0 points 0 points 0 points  Months in reverse 0 points 0 points 0 points 0 points  Repeat phrase 2 points 0 points 0 points 0 points  Total Score 2 0 0 0    Immunizations Immunization History  Administered Date(s) Administered   Janssen (J&J) SARS-COV-2 Vaccination 06/15/2019    TDAP status: Due, Education has been provided regarding the importance of this vaccine. Advised may receive this vaccine at local pharmacy or Health Dept. Aware to provide a copy of the vaccination record if obtained from local pharmacy or Health Dept. Verbalized  acceptance and understanding.  Flu Vaccine status: Declined, Education has been provided regarding the importance of this vaccine but patient still declined. Advised may receive this vaccine at local pharmacy or Health Dept. Aware to provide a copy of the vaccination record if obtained from local pharmacy or Health Dept. Verbalized acceptance and understanding.  Pneumococcal vaccine status: Declined,  Education has been provided regarding the importance of this vaccine but patient still declined. Advised may receive this vaccine at local pharmacy or Health Dept. Aware to provide a copy of the vaccination record if obtained from local pharmacy or Health Dept. Verbalized acceptance and understanding.   Covid-19 vaccine status: Completed vaccines  Qualifies for Shingles Vaccine? Yes   Zostavax completed No   Shingrix Completed?: No.    Education has been provided regarding the importance of this vaccine. Patient has been advised to call insurance company to determine out of pocket expense if they have not yet received this vaccine. Advised may also receive vaccine at local pharmacy or Health Dept. Verbalized acceptance and understanding.  Screening Tests Health Maintenance  Topic Date Due   TETANUS/TDAP  Never done   Zoster Vaccines- Shingrix (1 of 2) Never done   COVID-19 Vaccine (2 - Booster for Janssen series) 01/30/2021 (Originally 08/10/2019)   INFLUENZA VACCINE  06/04/2021 (Originally 10/05/2020)   Pneumonia Vaccine 5+ Years old (1 - PCV) 01/14/2022 (Originally 11/14/1951)   COLONOSCOPY (Pts 45-24yrs Insurance coverage will need to be confirmed)  04/06/2021   MAMMOGRAM  06/18/2021   DEXA SCAN  Completed   Hepatitis C Screening  Completed   HPV VACCINES  Aged Out    Health Maintenance  Health Maintenance Due  Topic Date Due   TETANUS/TDAP  Never done   Zoster Vaccines- Shingrix (1 of 2) Never done    Colorectal cancer screening: Type of screening: Colonoscopy. Completed 04/06/2016.  Repeat every 5 years  Mammogram status: patient to schedule  Bone Density status: Completed 06/19/2019. Results reflect: Bone density results: OSTEOPENIA. Repeat every 2 years.  Lung Cancer Screening: (Low Dose CT Chest recommended if Age 33-80 years, 30 pack-year currently smoking OR have quit w/in 15years.) does not qualify.  Lung Cancer Screening Referral: no  Additional Screening:  Hepatitis C Screening: does qualify; Completed 07/09/2019  Vision Screening: Recommended annual ophthalmology exams for early detection of glaucoma and other disorders of the eye. Is the patient up to date with their annual eye exam?  Yes  Who is the provider or what is the name of the office in which the patient attends annual eye exams? [facility] If pt is not established with a provider, would they like to be referred to a provider to establish care? No .   Dental Screening: Recommended annual dental exams for proper oral hygiene  Community Resource Referral / Chronic Care Management: CRR required this visit?  No   CCM required this visit?  No      Plan:     I have personally reviewed and noted the following in the patient's chart:   Medical and social history Use of alcohol, tobacco or illicit drugs  Current medications and supplements including opioid prescriptions.  Functional ability and status Nutritional status Physical activity Advanced directives List of other physicians Hospitalizations, surgeries, and ER visits in previous 12 months Vitals Screenings to include cognitive, depression, and falls Referrals and appointments  In addition, I have reviewed and discussed with patient certain preventive protocols, quality metrics, and best practice recommendations. A written personalized care plan for preventive services as well as general preventive health recommendations were provided to patient.     Kellie Simmering, LPN   [id number]   Nurse Notes: none

## 2021-01-14 NOTE — Patient Instructions (Signed)
Julia Gates , Thank you for taking time to come for your Medicare Wellness Visit. I appreciate your ongoing commitment to your health goals. Please review the following plan we discussed and let me know if I can assist you in the future.   Screening recommendations/referrals: Colonoscopy: completed 04/06/2016, due 04/06/2021 Mammogram: patient to schedule Bone Density: completed 06/19/2019 Recommended yearly ophthalmology/optometry visit for glaucoma screening and checkup Recommended yearly dental visit for hygiene and checkup  Vaccinations: Influenza vaccine: decline Pneumococcal vaccine: decline Tdap vaccine: due Shingles vaccine: discussed   Covid-19: 06/15/2019  Advanced directives: Advance directive discussed with you today. I have provided a copy for you to complete at home and have notarized. Once this is complete please bring a copy in to our office so we can scan it into your chart.  Conditions/risks identified: none  Next appointment: Follow up in one year for your annual wellness visit    Preventive Care 65 Years and Older, Female Preventive care refers to lifestyle choices and visits with your health care provider that can promote health and wellness. What does preventive care include? A yearly physical exam. This is also called an annual well check. Dental exams once or twice a year. Routine eye exams. Ask your health care provider how often you should have your eyes checked. Personal lifestyle choices, including: Daily care of your teeth and gums. Regular physical activity. Eating a healthy diet. Avoiding tobacco and drug use. Limiting alcohol use. Practicing safe sex. Taking low-dose aspirin every day. Taking vitamin and mineral supplements as recommended by your health care provider. What happens during an annual well check? The services and screenings done by your health care provider during your annual well check will depend on your age, overall health,  lifestyle risk factors, and family history of disease. Counseling  Your health care provider may ask you questions about your: Alcohol use. Tobacco use. Drug use. Emotional well-being. Home and relationship well-being. Sexual activity. Eating habits. History of falls. Memory and ability to understand (cognition). Work and work Statistician. Reproductive health. Screening  You may have the following tests or measurements: Height, weight, and BMI. Blood pressure. Lipid and cholesterol levels. These may be checked every 5 years, or more frequently if you are over 5 years old. Skin check. Lung cancer screening. You may have this screening every year starting at age 39 if you have a 30-pack-year history of smoking and currently smoke or have quit within the past 15 years. Fecal occult blood test (FOBT) of the stool. You may have this test every year starting at age 27. Flexible sigmoidoscopy or colonoscopy. You may have a sigmoidoscopy every 5 years or a colonoscopy every 10 years starting at age 44. Hepatitis C blood test. Hepatitis B blood test. Sexually transmitted disease (STD) testing. Diabetes screening. This is done by checking your blood sugar (glucose) after you have not eaten for a while (fasting). You may have this done every 1-3 years. Bone density scan. This is done to screen for osteoporosis. You may have this done starting at age 26. Mammogram. This may be done every 1-2 years. Talk to your health care provider about how often you should have regular mammograms. Talk with your health care provider about your test results, treatment options, and if necessary, the need for more tests. Vaccines  Your health care provider may recommend certain vaccines, such as: Influenza vaccine. This is recommended every year. Tetanus, diphtheria, and acellular pertussis (Tdap, Td) vaccine. You may need a Td booster every  10 years. Zoster vaccine. You may need this after age  23. Pneumococcal 13-valent conjugate (PCV13) vaccine. One dose is recommended after age 21. Pneumococcal polysaccharide (PPSV23) vaccine. One dose is recommended after age 51. Talk to your health care provider about which screenings and vaccines you need and how often you need them. This information is not intended to replace advice given to you by your health care provider. Make sure you discuss any questions you have with your health care provider. Document Released: 03/20/2015 Document Revised: 11/11/2015 Document Reviewed: 12/23/2014 Elsevier Interactive Patient Education  2017 [place] Prevention in the Home Falls can cause injuries. They can happen to people of all ages. There are many things you can do to make your home safe and to help prevent falls. What can I do on the outside of my home? Regularly fix the edges of walkways and driveways and fix any cracks. Remove anything that might make you trip as you walk through a door, such as a raised step or threshold. Trim any bushes or trees on the path to your home. Use bright outdoor lighting. Clear any walking paths of anything that might make someone trip, such as rocks or tools. Regularly check to see if handrails are loose or broken. Make sure that both sides of any steps have handrails. Any raised decks and porches should have guardrails on the edges. Have any leaves, snow, or ice cleared regularly. Use sand or salt on walking paths during winter. Clean up any spills in your garage right away. This includes oil or grease spills. What can I do in the bathroom? Use night lights. Install grab bars by the toilet and in the tub and shower. Do not use towel bars as grab bars. Use non-skid mats or decals in the tub or shower. If you need to sit down in the shower, use a plastic, non-slip stool. Keep the floor dry. Clean up any water that spills on the floor as soon as it happens. Remove soap buildup in the tub or shower  regularly. Attach bath mats securely with double-sided non-slip rug tape. Do not have throw rugs and other things on the floor that can make you trip. What can I do in the bedroom? Use night lights. Make sure that you have a light by your bed that is easy to reach. Do not use any sheets or blankets that are too big for your bed. They should not hang down onto the floor. Have a firm chair that has side arms. You can use this for support while you get dressed. Do not have throw rugs and other things on the floor that can make you trip. What can I do in the kitchen? Clean up any spills right away. Avoid walking on wet floors. Keep items that you use a lot in easy-to-reach places. If you need to reach something above you, use a strong step stool that has a grab bar. Keep electrical cords out of the way. Do not use floor polish or wax that makes floors slippery. If you must use wax, use non-skid floor wax. Do not have throw rugs and other things on the floor that can make you trip. What can I do with my stairs? Do not leave any items on the stairs. Make sure that there are handrails on both sides of the stairs and use them. Fix handrails that are broken or loose. Make sure that handrails are as long as the stairways. Check any carpeting to make  sure that it is firmly attached to the stairs. Fix any carpet that is loose or worn. Avoid having throw rugs at the top or bottom of the stairs. If you do have throw rugs, attach them to the floor with carpet tape. Make sure that you have a light switch at the top of the stairs and the bottom of the stairs. If you do not have them, ask someone to add them for you. What else can I do to help prevent falls? Wear shoes that: Do not have high heels. Have rubber bottoms. Are comfortable and fit you well. Are closed at the toe. Do not wear sandals. If you use a stepladder: Make sure that it is fully opened. Do not climb a closed stepladder. Make sure that  both sides of the stepladder are locked into place. Ask someone to hold it for you, if possible. Clearly mark and make sure that you can see: Any grab bars or handrails. First and last steps. Where the edge of each step is. Use tools that help you move around (mobility aids) if they are needed. These include: Canes. Walkers. Scooters. Crutches. Turn on the lights when you go into a dark area. Replace any light bulbs as soon as they burn out. Set up your furniture so you have a clear path. Avoid moving your furniture around. If any of your floors are uneven, fix them. If there are any pets around you, be aware of where they are. Review your medicines with your doctor. Some medicines can make you feel dizzy. This can increase your chance of falling. Ask your doctor what other things that you can do to help prevent falls. This information is not intended to replace advice given to you by your health care provider. Make sure you discuss any questions you have with your health care provider. Document Released: 12/18/2008 Document Revised: 07/30/2015 Document Reviewed: 03/28/2014 Elsevier Interactive Patient Education  2017 [employer].

## 2021-02-08 ENCOUNTER — Encounter: Payer: Medicare Other | Admitting: Nurse Practitioner

## 2021-02-09 ENCOUNTER — Ambulatory Visit (INDEPENDENT_AMBULATORY_CARE_PROVIDER_SITE_OTHER): Payer: Medicare Other | Admitting: Nurse Practitioner

## 2021-02-09 ENCOUNTER — Other Ambulatory Visit: Payer: Self-pay

## 2021-02-09 ENCOUNTER — Encounter: Payer: Self-pay | Admitting: Nurse Practitioner

## 2021-02-09 VITALS — BP 122/70 | HR 76 | Temp 98.4°F | Ht 64.0 in | Wt 158.2 lb

## 2021-02-09 DIAGNOSIS — Z6827 Body mass index (BMI) 27.0-27.9, adult: Secondary | ICD-10-CM

## 2021-02-09 DIAGNOSIS — Z23 Encounter for immunization: Secondary | ICD-10-CM

## 2021-02-09 DIAGNOSIS — I10 Essential (primary) hypertension: Secondary | ICD-10-CM

## 2021-02-09 DIAGNOSIS — L608 Other nail disorders: Secondary | ICD-10-CM | POA: Diagnosis not present

## 2021-02-09 DIAGNOSIS — R7303 Prediabetes: Secondary | ICD-10-CM

## 2021-02-09 MED ORDER — TERBINAFINE HCL 1 % EX SOLN: 2.0000 | Freq: Every day | CUTANEOUS | 2 refills | Status: DC

## 2021-02-09 MED ORDER — ZOSTER VAC RECOMB ADJUVANTED 50 MCG/0.5ML IM SUSR: 0.5000 mL | Freq: Once | INTRAMUSCULAR | 0 refills | Status: AC

## 2021-02-09 MED ORDER — TETANUS-DIPHTH-ACELL PERTUSSIS 5-2.5-18.5 LF-MCG/0.5 IM SUSP: 0.5000 mL | Freq: Once | INTRAMUSCULAR | 0 refills | Status: AC

## 2021-02-09 NOTE — Patient Instructions (Signed)

## 2021-02-09 NOTE — Progress Notes (Signed)
I,Yamilka J Llittleton,acting as a Education administrator for [employer], FNP.,have documented all relevant documentation on the behalf of Julia Brine, FNP,as directed by  Julia Brine, FNP while in the presence of Julia Gates, [place].   This visit occurred during the SARS-CoV-2 public health emergency.  Safety protocols were in place, including screening questions prior to the visit, additional usage of staff PPE, and extensive cleaning of exam room while observing appropriate contact time as indicated for disinfecting solutions.  Subjective:     Patient ID: Julia Gates , female    DOB: August 18, 1945 , 75 y.o.   MRN: [id number]   Chief Complaint  Patient presents with   Hypertension   Prediabetes    HPI  Pt here for BP f/u. No current concerns. She is not exercising consistently. Anytime she is up moving around her blood pressure is lower. She reports she has continued to have chest pressure in the morning and once she starts moving around the pressure goes away.   Wt Readings from Last 3 Encounters: 02/09/21 : 158 lb 3.2 oz (71.8 kg) 01/14/21 : 159 lb 3.2 oz (72.2 kg) 12/10/20 : 155 lb 3.2 oz (70.4 kg)    Hypertension This is a chronic problem. The current episode started more than 1 year ago. The problem has been gradually improving since onset. The problem is controlled. Pertinent negatives include no anxiety, chest pain or palpitations. There are no associated agents to hypertension. Risk factors for coronary artery disease include sedentary lifestyle (prediabetes). Past treatments include ACE inhibitors and diuretics. There are no compliance problems.  There is no history of angina. There is no history of chronic renal disease.    Past Medical History:  Diagnosis Date   Abnormal glucose level    High cholesterol    Hypertension    Lumbago    Sciatica    Vitamin D deficiency      Family History  Problem Relation Age of Onset   Heart disease Mother    Diabetes Mother    Diabetes  Brother    Diabetes Sister      Current Outpatient Medications:    Ascorbic Acid (VITAMIN C) 100 MG tablet, Take 100 mg by mouth as needed., Disp: , Rfl:    cholecalciferol (VITAMIN D3) 25 MCG (1000 UT) tablet, Take 1,000 Units by mouth daily., Disp: , Rfl:    co-enzyme Q-10 30 MG capsule, Take 30 mg by mouth daily., Disp: , Rfl:    Multiple Vitamin (MULTIVITAMIN) tablet, Take 1 tablet by mouth daily., Disp: , Rfl:    NON FORMULARY, Green protein drink, Disp: , Rfl:    NON FORMULARY, Black seed drink, Disp: , Rfl:    Omega-3-6-9 CAPS, Take by mouth., Disp: , Rfl:    spironolactone (ALDACTONE) 25 MG tablet, Take 1 tablet (25 mg total) by mouth daily., Disp: 90 tablet, Rfl: 1   Tdap (BOOSTRIX) 5-2.5-18.5 LF-MCG/0.5 injection, Inject 0.5 mLs into the muscle once for 1 dose., Disp: 0.5 mL, Rfl: 0   Terbinafine HCl 1 % SOLN, Apply 2 sprays topically daily., Disp: 125 mL, Rfl: 2   valsartan-hydrochlorothiazide (DIOVAN-HCT) 320-25 MG tablet, Take 1 tablet by mouth daily., Disp: 90 tablet, Rfl: 1   verapamil (CALAN-SR) 180 MG CR tablet, Take 1 tablet (180 mg total) by mouth at bedtime., Disp: 90 tablet, Rfl: 3   Zoster Vaccine Adjuvanted (SHINGRIX) injection, Inject 0.5 mLs into the muscle once for 1 dose., Disp: 0.5 mL, Rfl: 0   Allergies  Allergen Reactions  Penicillins Rash     Review of Systems  Constitutional: Negative.   Respiratory: Negative.    Cardiovascular: Negative.  Negative for chest pain, palpitations and leg swelling.  Gastrointestinal:        Reports having an intermittent stomach discomfort.   Neurological: Negative.   Psychiatric/Behavioral: Negative.      Today's Vitals   02/09/21 0849  BP: 122/70  Pulse: 76  Temp: 98.4 F (36.9 C)  Weight: 158 lb 3.2 oz (71.8 kg)  Height: 5\' 4"  (1.626 m)  PainSc: 0-No pain   Body mass index is 27.15 kg/m.   Objective:  Physical Exam Vitals reviewed.  Constitutional:      General: She is not in acute distress.     Appearance: Normal appearance.  Cardiovascular:     Rate and Rhythm: Normal rate and regular rhythm.     Pulses: Normal pulses.     Heart sounds: Normal heart sounds. No murmur heard. Pulmonary:     Effort: Pulmonary effort is normal. No respiratory distress.     Breath sounds: Normal breath sounds. No wheezing.  Skin:    General: Skin is warm and dry.     Capillary Refill: Capillary refill takes less than 2 seconds.     Comments: Right great toe with darkened nailbed on left side  Neurological:     General: No focal deficit present.     Mental Status: She is alert and oriented to person, place, and time.     Cranial Nerves: No cranial nerve deficit.     Motor: No weakness.  Psychiatric:        Mood and Affect: Mood normal.        Behavior: Behavior normal.        Thought Content: Thought content normal.        Judgment: Judgment normal.        Assessment And Plan:     1. Essential hypertension Comments: Blood pressure remains well controlled. Continue current medications and follow up with Dr. Einar Gip  2. Prediabetes Comments: I have stressed again the importance of lowering her HgbA1c (6.4), she continues to decline medications. Encouraged increase physical activity and fiber  3. Nail discoloration Comments: Darkened nailbed to left great toe, encouraged to use an antifungal spray - Terbinafine HCl 1 % SOLN; Apply 2 sprays topically daily.  Dispense: 125 mL; Refill: 2  4. BMI 27.0-27.9,adult She is encouraged to strive for BMI less than 25 to decrease cardiac risk. Advised to aim for at least 150 minutes of exercise per week.  5. Immunization due Comments: Rxs sent to pharmacy - Tdap ([place]) 5-2.5-18.5 LF-MCG/0.5 injection; Inject 0.5 mLs into the muscle once for 1 dose.  Dispense: 0.5 mL; Refill: 0 - Zoster Vaccine Adjuvanted [facility]) injection; Inject 0.5 mLs into the muscle once for 1 dose.  Dispense: 0.5 mL; Refill: 0     Patient was given opportunity to ask  questions. Patient verbalized understanding of the plan and was able to repeat key elements of the plan. All questions were answered to their satisfaction.  Julia Brine, FNP   I, Julia Brine, FNP, have reviewed all documentation for this visit. The documentation on 02/09/21 for the exam, diagnosis, procedures, and orders are all accurate and complete.   IF YOU HAVE BEEN REFERRED TO A SPECIALIST, IT MAY TAKE 1-2 WEEKS TO SCHEDULE/PROCESS THE REFERRAL. IF YOU HAVE NOT HEARD FROM US/SPECIALIST IN TWO WEEKS, PLEASE GIVE [place] A CALL AT [phone].   THE  PATIENT IS ENCOURAGED TO PRACTICE SOCIAL DISTANCING DUE TO THE COVID-19 PANDEMIC.

## 2021-02-15 ENCOUNTER — Encounter: Payer: Medicare Other | Admitting: Nurse Practitioner

## 2021-05-11 ENCOUNTER — Other Ambulatory Visit: Payer: Self-pay

## 2021-05-11 ENCOUNTER — Ambulatory Visit (INDEPENDENT_AMBULATORY_CARE_PROVIDER_SITE_OTHER): Payer: Medicare Other | Admitting: Nurse Practitioner

## 2021-05-11 ENCOUNTER — Encounter: Payer: Self-pay | Admitting: Nurse Practitioner

## 2021-05-11 VITALS — BP 136/80 | HR 90 | Temp 98.2°F | Ht 64.0 in | Wt 160.6 lb

## 2021-05-11 DIAGNOSIS — Z6827 Body mass index (BMI) 27.0-27.9, adult: Secondary | ICD-10-CM

## 2021-05-11 DIAGNOSIS — M545 Low back pain, unspecified: Secondary | ICD-10-CM

## 2021-05-11 DIAGNOSIS — I10 Essential (primary) hypertension: Secondary | ICD-10-CM | POA: Diagnosis not present

## 2021-05-11 DIAGNOSIS — R3915 Urgency of urination: Secondary | ICD-10-CM

## 2021-05-11 DIAGNOSIS — Z2821 Immunization not carried out because of patient refusal: Secondary | ICD-10-CM

## 2021-05-11 MED ORDER — METHOCARBAMOL 500 MG PO TABS: 500.0000 mg | ORAL_TABLET | Freq: Three times a day (TID) | ORAL | 0 refills | Status: DC | PRN

## 2021-05-11 MED ORDER — KETOROLAC TROMETHAMINE 30 MG/ML IJ SOLN
30.0000 mg | Freq: Once | INTRAMUSCULAR | Status: AC
  Administered 2021-05-11: 30 mg via INTRAMUSCULAR

## 2021-05-11 MED ORDER — KETOROLAC TROMETHAMINE 30 MG/ML IJ SOLN: 30.0000 mg | Freq: Once | INTRAMUSCULAR | 0 refills | Status: DC

## 2021-05-11 NOTE — Patient Instructions (Signed)
Cooking With Less Salt Cooking with less salt is one way to reduce the amount of sodium you get from food. Sodium is one of the elements that make up salt. It is found naturally in foods and is also added to certain foods. Depending on your condition and overall health, your health care provider or dietitian may recommend that you reduce your sodium intake. Most people should have less than 2,300 milligrams (mg) of sodium each day. If you have high blood pressure (hypertension), you may need to limit your sodium to 1,500 mg each day. Follow the tipsbelow to help reduce your sodium intake. What are tips for eating less sodium? Reading food labels  Check the food label before buying or using packaged ingredients. Always check the label for the serving size and sodium content. Look for products with no more than 140 mg of sodium in one serving. Check the % Daily Value column to see what percent of the daily recommended amount of sodium is provided in one serving of the product. Foods with 5% or less in this column are considered low in sodium. Foods with 20% or higher are considered high in sodium. Do not choose foods with salt as one of the first three ingredients on the ingredients list. If salt is one of the first three ingredients, it usually means the item is high in sodium.  Shopping Buy sodium-free or low-sodium products. Look for the following words on food labels: Low-sodium. Sodium-free. Reduced-sodium. No salt added. Unsalted. Always check the sodium content even if foods are labeled as low-sodium or no salt added. Buy fresh foods. Cooking Use herbs, seasonings without salt, and spices as substitutes for salt. Use sodium-free baking soda when baking. Grill, braise, or roast foods to add flavor with less salt. Avoid adding salt to pasta, rice, or hot cereals. Drain and rinse canned vegetables, beans, and meat before use. Avoid adding salt when cooking sweets and desserts. Cook with  low-sodium ingredients. What foods are high in sodium? Vegetables Regular canned vegetables (not low-sodium or reduced-sodium). Sauerkraut, pickled vegetables, and relishes. Olives. French fries. Onion rings. Regular canned tomato sauce and paste. Regular tomato and vegetable juice. Frozenvegetables in sauces. Grains Instant hot cereals. Bread stuffing, pancake, and biscuit mixes. Croutons. Seasoned rice or pasta mixes. Noodle soup cups. Boxed or frozen macaroni and cheese. Regular salted crackers. Self-rising flour. Rolls. Bagels. Flourtortillas and wraps. Meats and other proteins Meat or fish that is salted, canned, smoked, cured, spiced, or pickled. This includes bacon, ham, sausages, hot dogs, corned beef, chipped beef, meat loaves, salt pork, jerky, pickled herring, anchovies, regular canned tuna, andsardines. Salted nuts. Dairy Processed cheese and cheese spreads. Cheese curds. Blue cheese. Feta cheese.String cheese. Regular cottage cheese. Buttermilk. Canned milk. The items listed above may not be a complete list of foods high in sodium. Actual amounts of sodium may be different depending on processing. Contact a dietitian for more information. What foods are low in sodium? Fruits Fresh, frozen, or canned fruit with no sauce added. Fruit juice. Vegetables Fresh or frozen vegetables with no sauce added. "No salt added" canned vegetables. "No salt added" tomato sauce and paste. Low-sodium orreduced-sodium tomato and vegetable juice. Grains Noodles, pasta, quinoa, rice. Shredded or puffed wheat or puffed rice. Regular or quick oats (not instant). Low-sodium crackers. Low-sodium bread. Whole-grainbread and whole-grain pasta. Unsalted popcorn. Meats and other proteins Fresh or frozen whole meats, poultry (not injected with sodium), and fish with no sauce added. Unsalted nuts. Dried peas, beans, and   lentils without added salt. Unsalted canned beans. Eggs. Unsalted nut butters. Low-sodium canned  tunaor chicken. Dairy Milk. Soy milk. Yogurt. Low-sodium cheeses, such as Swiss, Monte[place] Jack, mozzarella, and ricotta. Sherbet or ice cream (keep to  cup per serving).Cream cheese. Fats and oils Unsalted butter or margarine. Other foods Homemade pudding. Sodium-free baking soda and baking powder. Herbs and spices.Low-sodium seasoning mixes. Beverages Coffee and tea. Carbonated beverages. The items listed above may not be a complete list of foods low in sodium. Actual amounts of sodium may be different depending on processing. Contact a dietitian for more information. What are some salt alternatives when cooking? The following are herbs, seasonings, and spices that can be used instead of salt to flavor your food. Herbs should be fresh or dried. Do not choose packaged mixes. Next to the name of the herb, spice, or seasoning aresome examples of foods you can pair it with. Herbs [place] leaves - Soups, meat and vegetable dishes, and spaghetti sauce. Basil - Italian dishes, soups, pasta, and fish dishes. Cilantro - Meat, poultry, and vegetable dishes. Chili powder - Marinades and Mexican dishes. Chives - Salad dressings and potato dishes. Cumin - Mexican dishes, couscous, and meat dishes. Dill - Fish dishes, sauces, and salads. Fennel - Meat and vegetable dishes, breads, and cookies. Garlic (do not use garlic salt) - Italian dishes, meat dishes, salad dressings, and sauces. Marjoram - Soups, potato dishes, and meat dishes. Oregano - Pizza and spaghetti sauce. Parsley - Salads, soups, pasta, and meat dishes. Rosemary - Italian dishes, salad dressings, soups, and red meats. Saffron - Fish dishes, pasta, and some poultry dishes. Sage - Stuffings and sauces. Tarragon - Fish and poultry dishes. Thyme - Stuffing, meat, and fish dishes. Seasonings Lemon juice - Fish dishes, poultry dishes, vegetables, and salads. Vinegar - Salad dressings, vegetables, and fish dishes. Spices Cinnamon - Sweet  dishes, such as cakes, cookies, and puddings. Cloves - Gingerbread, puddings, and marinades for meats. Curry - Vegetable dishes, fish and poultry dishes, and stir-fry dishes. Ginger - Vegetable dishes, fish dishes, and stir-fry dishes. Nutmeg - Pasta, vegetables, poultry, fish dishes, and custard. Summary Cooking with less salt is one way to reduce the amount of sodium that you get from food. Buy sodium-free or low-sodium products. Check the food label before using or buying packaged ingredients. Use herbs, seasonings without salt, and spices as substitutes for salt in foods. This information is not intended to replace advice given to you by your health care provider. Make sure you discuss any questions you have with your healthcare provider. Document Revised: 02/13/2019 Document Reviewed: 02/13/2019 Elsevier Patient Education  2022 [employer]  

## 2021-05-11 NOTE — Progress Notes (Signed)
?[employer] as a Education administrator for [employer], FNP.,have documented all relevant documentation on the behalf of Minette Brine, FNP,as directed by  Minette Brine, FNP while in the presence of Minette Brine, [place].  ? ?This visit occurred during the SARS-CoV-2 public health emergency.  Safety protocols were in place, including screening questions prior to the visit, additional usage of staff PPE, and extensive cleaning of exam room while observing appropriate contact time as indicated for disinfecting solutions. ? ?Subjective:  ?  ? Patient ID: Julia Gates , female    DOB: 11-Jun-1945 , 75 y.o.   MRN: [id number] ? ? ?Chief Complaint  ?Patient presents with  ? Hypertension  ? ? ?HPI ? ?The patient is here today for a blood pressure f/u.  ? ?Hypertension ?This is a chronic problem. The current episode started more than 1 year ago. Pertinent negatives include no chest pain or palpitations.   ? ?Past Medical History:  ?Diagnosis Date  ? Abnormal glucose level   ? High cholesterol   ? Hypertension   ? Lumbago   ? Sciatica   ? Vitamin D deficiency   ?  ? ?Family History  ?Problem Relation Age of Onset  ? Heart disease Mother   ? Diabetes Mother   ? Diabetes Brother   ? Diabetes Sister   ? ? ? ?Current Outpatient Medications:  ?  Ascorbic Acid (VITAMIN C) 100 MG tablet, Take 100 mg by mouth as needed., Disp: , Rfl:  ?  cholecalciferol (VITAMIN D3) 25 MCG (1000 UT) tablet, Take 1,000 Units by mouth daily., Disp: , Rfl:  ?  co-enzyme Q-10 30 MG capsule, Take 30 mg by mouth daily., Disp: , Rfl:  ?  methocarbamol (ROBAXIN) 500 MG tablet, Take 1 tablet (500 mg total) by mouth every 8 (eight) hours as needed for muscle spasms., Disp: 20 tablet, Rfl: 0 ?  Multiple Vitamin (MULTIVITAMIN) tablet, Take 1 tablet by mouth daily., Disp: , Rfl:  ?  NON FORMULARY, Green protein drink, Disp: , Rfl:  ?  NON FORMULARY, Black seed drink, Disp: , Rfl:  ?  Omega-3-6-9 CAPS, Take by mouth., Disp: , Rfl:  ?  spironolactone (ALDACTONE) 25 MG  tablet, Take 1 tablet (25 mg total) by mouth daily., Disp: 90 tablet, Rfl: 1 ?  valsartan-hydrochlorothiazide (DIOVAN-HCT) 320-25 MG tablet, Take 1 tablet by mouth daily., Disp: 90 tablet, Rfl: 1 ?  verapamil (CALAN-SR) 180 MG CR tablet, Take 1 tablet (180 mg total) by mouth at bedtime., Disp: 90 tablet, Rfl: 3 ? ?Current Facility-Administered Medications:  ?  ketorolac (TORADOL) 30 MG/ML injection 30 mg, 30 mg, Intramuscular, Once, Minette Brine, FNP  ? ?Allergies  ?Allergen Reactions  ? Penicillins Rash  ?  ? ?Review of Systems  ?Constitutional: Negative.   ?Respiratory: Negative.    ?Cardiovascular: Negative.  Negative for chest pain, palpitations and leg swelling.  ?Gastrointestinal: Negative.   ?Genitourinary:  Positive for urgency. Negative for decreased urine volume, difficulty urinating, dysuria, frequency and hematuria.  ?     Will feel like needing to urinate, now she has urinary incontinence. She is describing a feeling that usually is not present when urinating. This has been for 2-3 months.   ?Musculoskeletal:  Positive for back pain (Friday went to gym and since Saturday her pain has worsened. She has not taken any oral medications for pain. She has used CBD cream.).  ?Neurological: Negative.   ?Psychiatric/Behavioral: Negative.    ?All other systems reviewed and are negative.  ? ?Today's Vitals  ?  05/11/21 0954  ?BP: 136/80  ?Pulse: 90  ?Temp: 98.2 ?F (36.8 ?C)  ?Weight: 160 lb 9.6 oz (72.8 kg)  ?Height: 5' 4"  (1.626 m)  ?PainSc: 7   ?PainLoc: Back  ? ?Body mass index is 27.57 kg/m?.  ?Wt Readings from Last 3 Encounters:  ?05/11/21 160 lb 9.6 oz (72.8 kg)  ?02/09/21 158 lb 3.2 oz (71.8 kg)  ?01/14/21 159 lb 3.2 oz (72.2 kg)  ?  ?BP Readings from Last 3 Encounters:  ?05/11/21 136/80  ?02/09/21 122/70  ?01/14/21 112/70  ?  ?Objective:  ?Physical Exam ?Vitals reviewed.  ?Constitutional:   ?   General: She is not in acute distress. ?   Appearance: Normal appearance.  ?Cardiovascular:  ?   Rate and Rhythm:  Normal rate and regular rhythm.  ?   Pulses: Normal pulses.  ?   Heart sounds: Normal heart sounds. No murmur heard. ?Pulmonary:  ?   Effort: Pulmonary effort is normal. No respiratory distress.  ?   Breath sounds: Normal breath sounds. No wheezing.  ?Skin: ?   General: Skin is warm and dry.  ?   Capillary Refill: Capillary refill takes less than 2 seconds.  ?Neurological:  ?   General: No focal deficit present.  ?   Mental Status: She is alert and oriented to person, place, and time.  ?   Cranial Nerves: No cranial nerve deficit.  ?   Motor: No weakness.  ?Psychiatric:     ?   Mood and Affect: Mood normal.     ?   Behavior: Behavior normal.     ?   Thought Content: Thought content normal.     ?   Judgment: Judgment normal.  ?  ? ?   ?Assessment And Plan:  ?   ?1. Essential hypertension ?Comments: Blood pressure is slightly elevated, she is having back pain at the current time. Advised to check blood pressure daily and call office on Thursday ?- BMP8+EGFR ? ?2. Urinary urgency ? ?3. Acute midline low back pain without sciatica ?Comments: Tenderness midline, negative radiculopathy. Muscle strain vs muscle spasms ?- methocarbamol (ROBAXIN) 500 MG tablet; Take 1 tablet (500 mg total) by mouth every 8 (eight) hours as needed for muscle spasms.  Dispense: 20 tablet; Refill: 0 ?- ketorolac (TORADOL) 30 MG/ML injection 30 mg ? ?4. COVID-19 vaccine dose declined ?Declines covid 19 vaccine. Discussed risk of covid 78 and if she changes her mind about the vaccine to call the office.  Encouraged to take multivitamin, vitamin d, vitamin c and zinc to increase immune system. Aware can call office if would like to have vaccine here at office.   ? ?5. Varicella zoster virus (VZV) vaccination declined ? ?6. Tetanus, diphtheria, and acellular pertussis (Tdap) vaccination declined ? ?7. BMI 27.0-27.9,adult ? She is encouraged to strive for BMI less than 27 to decrease cardiac risk. Advised to aim for at least 150 minutes of exercise  per week. Encouraged to take a break from exercising until her back is better at least 5 days ? ? ?Patient was given opportunity to ask questions. Patient verbalized understanding of the plan and was able to repeat key elements of the plan. All questions were answered to their satisfaction.  ?Minette Brine, FNP  ? ?I, Minette Brine, FNP, have reviewed all documentation for this visit. The documentation on 05/11/21 for the exam, diagnosis, procedures, and orders are all accurate and complete.  ? ?IF YOU HAVE BEEN REFERRED TO A SPECIALIST, IT MAY TAKE  1-2 WEEKS TO SCHEDULE/PROCESS THE REFERRAL. IF YOU HAVE NOT HEARD FROM US/SPECIALIST IN TWO WEEKS, PLEASE GIVE [place] A CALL AT [phone].  ? ?THE PATIENT IS ENCOURAGED TO PRACTICE SOCIAL DISTANCING DUE TO THE COVID-19 PANDEMIC.   ?

## 2021-06-09 DIAGNOSIS — Z961 Presence of intraocular lens: Secondary | ICD-10-CM | POA: Diagnosis not present

## 2021-06-09 DIAGNOSIS — H04123 Dry eye syndrome of bilateral lacrimal glands: Secondary | ICD-10-CM | POA: Diagnosis not present

## 2021-06-09 DIAGNOSIS — E119 Type 2 diabetes mellitus without complications: Secondary | ICD-10-CM | POA: Diagnosis not present

## 2021-06-09 DIAGNOSIS — H53021 Refractive amblyopia, right eye: Secondary | ICD-10-CM | POA: Diagnosis not present

## 2021-06-09 NOTE — Progress Notes (Signed)
? ?Primary Physician/Referring:  Minette Brine, FNP ? ?Patient ID: Julia Gates, female    DOB: 06/09/1945, 76 y.o.   MRN: [id number] ? ?Chief Complaint  ?Patient presents with  ?? Hypertension  ?? Follow-up  ?  6 months  ? ? ?HPI: Julia Gates  is a 76 y.o. female  Afro-American female with difficult control hypertension, diet-controlled diabetes and hypertension with hypertensive heart.  She does not want to be on statins, she is also very particular about the medications. ? ?Serum annual basis at her request. States that she is doing well  Denies chest pain or dyspnea. ?Past Medical History:  ?Diagnosis Date  ?? Abnormal glucose level   ?? High cholesterol   ?? Hypertension   ?? Lumbago   ?? Sciatica   ?? Vitamin D deficiency   ? ? ?Past Surgical History:  ?Procedure Laterality Date  ?? CATARACT EXTRACTION Right 12/2018  ?? CATARACT EXTRACTION, BILATERAL  01/2019  ?? TUBAL LIGATION    ? age 53  ? ?Social History  ? ?Tobacco Use  ?? Smoking status: Never  ?? Smokeless tobacco: Never  ?Substance Use Topics  ?? Alcohol use: No  ? Marital Status: Married  ? ?Current Outpatient Medications:  ??  Ascorbic Acid (VITAMIN C) 100 MG tablet, Take 100 mg by mouth as needed., Disp: , Rfl:  ??  cholecalciferol (VITAMIN D3) 25 MCG (1000 UT) tablet, Take 1,000 Units by mouth daily., Disp: , Rfl:  ??  co-enzyme Q-10 30 MG capsule, Take 30 mg by mouth daily., Disp: , Rfl:  ??  methocarbamol (ROBAXIN) 500 MG tablet, Take 1 tablet (500 mg total) by mouth every 8 (eight) hours as needed for muscle spasms., Disp: 20 tablet, Rfl: 0 ??  Multiple Vitamin (MULTIVITAMIN) tablet, Take 1 tablet by mouth daily., Disp: , Rfl:  ??  NON FORMULARY, Green protein drink, Disp: , Rfl:  ??  NON FORMULARY, Black seed drink, Disp: , Rfl:  ??  Omega-3-6-9 CAPS, Take by mouth., Disp: , Rfl:  ??  spironolactone (ALDACTONE) 25 MG tablet, Take 1 tablet (25 mg total) by mouth daily., Disp: 90 tablet, Rfl: 1 ??  valsartan-hydrochlorothiazide (DIOVAN-HCT)  320-25 MG tablet, Take 1 tablet by mouth daily., Disp: 90 tablet, Rfl: 1 ??  verapamil (CALAN-SR) 240 MG CR tablet, Take 1 tablet (240 mg total) by mouth at bedtime., Disp: 100 tablet, Rfl: 3  ? ?Review of Systems  ?Cardiovascular:  Negative for chest pain, dyspnea on exertion and leg swelling.  ?   ?Objective  ?Blood pressure (!) 156/78, pulse 84, temperature (!) 97.3 ?F (36.3 ?C), temperature source Temporal, resp. rate (!) 97, height '5\' 4"'$  (1.626 m), weight 160 lb 9.6 oz (72.8 kg), SpO2 97 %. Body mass index is 27.57 kg/m?.  ? ? ?  06/10/2021  ? 10:14 AM 06/10/2021  ? 10:12 AM 05/11/2021  ?  9:54 AM  ?Vitals with BMI  ?Height  '5\' 4"'$  '5\' 4"'$   ?Weight  160 lbs 10 oz 160 lbs 10 oz  ?BMI  27.55 27.55  ?Systolic 270 350 093  ?Diastolic 78 83 80  ?Pulse 84 79 90  ?  ?Physical Exam ?Neck:  ?   Vascular: No JVD.  ?Cardiovascular:  ?   Rate and Rhythm: Normal rate and regular rhythm.  ?   Pulses: Intact distal pulses.  ?   Heart sounds: Normal heart sounds. No murmur heard. ?  No gallop.  ?Pulmonary:  ?   Effort: Pulmonary effort is normal.  ?  Breath sounds: Normal breath sounds.  ?Abdominal:  ?   General: Bowel sounds are normal.  ?   Palpations: Abdomen is soft.  ?Musculoskeletal:  ?   Right lower leg: No edema.  ?   Left lower leg: No edema.  ? ?Radiology: ?No results found. ? ?Laboratory examination:  ? ? ? ?  Latest Ref Rng & Units 12/01/2020  ? 11:00 AM 09/03/2020  ? 10:21 AM 04/14/2020  ? 11:12 AM  ?CMP  ?Glucose 70 - 99 mg/dL 102   92   107    ?BUN 8 - 27 mg/dL '19   25   14    '$ ?Creatinine 0.57 - 1.00 mg/dL 1.11   0.89   0.88    ?Sodium 134 - 144 mmol/L 141   138   141    ?Potassium 3.5 - 5.2 mmol/L 4.4   4.2   3.8    ?Chloride 96 - 106 mmol/L 100   102   102    ?CO2 20 - 29 mmol/L '24   25   21    '$ ?Calcium 8.7 - 10.3 mg/dL 10.1   9.7   9.9    ?Total Protein 6.0 - 8.5 g/dL  6.9     ?Total Bilirubin 0.0 - 1.2 mg/dL  0.5     ?Alkaline Phos 44 - 121 IU/L  57     ?AST 0 - 40 IU/L  17     ?ALT 0 - 32 IU/L  16     ? ? ?  Latest Ref  Rng & Units 01/09/2020  ? 11:19 AM 01/08/2019  ? 12:46 PM 07/05/2018  ? 11:55 AM  ?CBC  ?WBC 3.4 - 10.8 x10E3/uL 4.6   5.5   5.5    ?Hemoglobin 11.1 - 15.9 g/dL 13.7   13.6   13.5    ?Hematocrit 34.0 - 46.6 % 41.6   40.4   39.7    ?Platelets 150 - 450 x10E3/uL 239   207   217    ? ?Lipid Panel  ?   ?Component Value Date/Time  ? CHOL 212 (H) 12/01/2020 1100  ? TRIG 96 12/01/2020 1100  ? HDL 57 12/01/2020 1100  ? CHOLHDL 3.7 12/01/2020 1100  ? LDLCALC 138 (H) 12/01/2020 1100  ? ?HEMOGLOBIN A1C ?Lab Results  ?Component Value Date  ? HGBA1C 6.4 (H) 12/01/2020  ? ?TSH ?No results for input(s): TSH in the last 8760 hours. ? ?Cardiac Studies:  ? ?Nuclear stress test  [06/15/2015]: ?1. The resting electrocardiogram demonstrated normal sinus rhythm, RBBB and no resting arrhythmias. The stress electrocardiogram was normal. Patient exercised on Bruce protocol for 5:03 minutes and achieved 7.05 METS. Stress test terminated due to target heart rate( 87% MPHR) and dyspnea. ?2. Myocardial perfusion imaging is normal. Overall left ventricular systolic function was normal without regional wall motion abnormalities. The left ventricular ejection fraction was 80%. ? ?Renal Dopplers  [05/13/2015]: No evidence of renal artery occlusive disease in either renal artery. ?Normal intrarenal vascular perfusion is noted in both kidneys. Mild increase in the echogenicity of both kidneys suggest medico-renal disease. Normal sized kidneys. Compared to the study done on 01/09/14, right renal artery stenosis not evident in the present study. ? ?Renal Dopplers  [01/09/2014]: Renal artery duplex: Hemodynamically significant stenosis of the right renal artery. Normal intrarenal vascular perfusion is noted in both kidneys. < 50% stenosis of the left renal artery ostium. Bilateral renal size is normal. ? ?Echocardiogram 07/31/2018: ?Normal LV systolic function with EF  56%. Left ventricle cavity is normal in size. Moderate concentric hypertrophy of the left  ventricle. Normal global wall motion. Doppler evidence of grade I (impaired) diastolic dysfunction, normal LAP. Calculated EF 56%. ?Left atrial cavity is mildly dilated. ?Mild (Grade I) mitral regurgitation. ?Mild to moderate tricuspid regurgitation. Estimated pulmonary artery systolic pressure 24 mmHg, which is normal.  ?No significant change compared to previous study in 2015. ? ?EKG:  ? ?EKG 06/10/2021: Normal sinus rhythm with rate of 72 bpm, normal axis, right bundle branch block. No significant change from EKG 08/27/2020. ? ?Assessment  ? ?  ICD-10-CM   ?1. Essential hypertension  I10 EKG 12-Lead  ?  verapamil (CALAN-SR) 240 MG CR tablet  ?  ?2. Mild hypercholesterolemia  E78.00   ?  ?3. Pre-diabetes  R73.03   ?  ?  ?Recommendations:  ? ?Ms. Julia Gates is a 76 y.o. Afro-American female with difficult control hypertension, diet-controlled diabetes and hypertension with hypertensive heart.  She does not want to be on statins, she is also very particular about the medications. ? ?Serum annual basis at her request.  Blood pressure is elevated, will increase verapamil from 180 mg to 240 mg daily.  She has an appointment to see Ms. Laurance Flatten, NP next month, if blood pressure still elevated, could consider changing valsartan HCT 320/25 mg to Benicar HCT 40/25 mg.  Could also increase spironolactone to 50 mg daily and follow-up on a BMP. ? ?I again discussed with her regarding primary prevention, in view of prediabetes, hypertension, age and race, I still recommend statin therapy, patient is reluctant about this.  She was concerned about myocardial infarction and ACS, again discussed primary prevention.  Weight loss discussed.  I will see her back in 6 months for follow-up.    ? ? ?Adrian Prows, MD, [facility] ?06/10/2021, 10:38 AM ?Office: [phone] ?Fax: [phone] ?Pager: [phone]  ?

## 2021-06-10 ENCOUNTER — Ambulatory Visit: Payer: Medicare Other | Admitting: Cardiology

## 2021-06-10 ENCOUNTER — Encounter: Payer: Self-pay | Admitting: Cardiology

## 2021-06-10 VITALS — BP 156/78 | HR 84 | Temp 97.3°F | Resp 97 | Ht 64.0 in | Wt 160.6 lb

## 2021-06-10 DIAGNOSIS — I10 Essential (primary) hypertension: Secondary | ICD-10-CM | POA: Diagnosis not present

## 2021-06-10 DIAGNOSIS — E7800 Pure hypercholesterolemia, unspecified: Secondary | ICD-10-CM

## 2021-06-10 DIAGNOSIS — R7303 Prediabetes: Secondary | ICD-10-CM

## 2021-06-10 MED ORDER — VERAPAMIL HCL ER 240 MG PO TBCR: 240.0000 mg | EXTENDED_RELEASE_TABLET | Freq: Every day | ORAL | 3 refills | Status: DC

## 2021-07-07 ENCOUNTER — Ambulatory Visit (INDEPENDENT_AMBULATORY_CARE_PROVIDER_SITE_OTHER): Payer: Medicare Other | Admitting: Nurse Practitioner

## 2021-07-07 ENCOUNTER — Encounter: Payer: Self-pay | Admitting: Nurse Practitioner

## 2021-07-07 VITALS — BP 132/78 | HR 74 | Temp 98.3°F | Ht 64.0 in | Wt 158.0 lb

## 2021-07-07 DIAGNOSIS — Z1231 Encounter for screening mammogram for malignant neoplasm of breast: Secondary | ICD-10-CM

## 2021-07-07 DIAGNOSIS — R82998 Other abnormal findings in urine: Secondary | ICD-10-CM | POA: Diagnosis not present

## 2021-07-07 DIAGNOSIS — M85851 Other specified disorders of bone density and structure, right thigh: Secondary | ICD-10-CM

## 2021-07-07 DIAGNOSIS — Z0000 Encounter for general adult medical examination without abnormal findings: Secondary | ICD-10-CM

## 2021-07-07 DIAGNOSIS — Z79899 Other long term (current) drug therapy: Secondary | ICD-10-CM

## 2021-07-07 DIAGNOSIS — E7800 Pure hypercholesterolemia, unspecified: Secondary | ICD-10-CM

## 2021-07-07 DIAGNOSIS — R7303 Prediabetes: Secondary | ICD-10-CM

## 2021-07-07 DIAGNOSIS — Z1211 Encounter for screening for malignant neoplasm of colon: Secondary | ICD-10-CM

## 2021-07-07 DIAGNOSIS — I10 Essential (primary) hypertension: Secondary | ICD-10-CM

## 2021-07-07 DIAGNOSIS — E2839 Other primary ovarian failure: Secondary | ICD-10-CM

## 2021-07-07 LAB — POCT URINALYSIS DIPSTICK
Bilirubin, UA: NEGATIVE
Glucose, UA: NEGATIVE
Ketones, UA: NEGATIVE
Nitrite, UA: NEGATIVE
Protein, UA: POSITIVE — AB
Spec Grav, UA: 1.015 (ref 1.010–1.025)
Urobilinogen, UA: 0.2 E.U./dL
pH, UA: 6 (ref 5.0–8.0)

## 2021-07-07 MED ORDER — PROBIOTIC 250 MG PO CAPS: 1.0000 | ORAL_CAPSULE | Freq: Two times a day (BID) | ORAL | 2 refills | Status: DC

## 2021-07-07 NOTE — Progress Notes (Signed)
I,Tianna Badgett,acting as a Education administrator for [employer], FNP.,have documented all relevant documentation on the behalf of Minette Brine, FNP,as directed by  Minette Brine, FNP while in the presence of Minette Brine, [place].  This visit occurred during the SARS-CoV-2 public health emergency.  Safety protocols were in place, including screening questions prior to the visit, additional usage of staff PPE, and extensive cleaning of exam room while observing appropriate contact time as indicated for disinfecting solutions.  Subjective:     Patient ID: Julia Gates , female    DOB: 1945/07/19 , 76 y.o.   MRN: [id number]   Chief Complaint  Patient presents with   Annual Exam    HPI  Patient here for HM.   Wt Readings from Last 3 Encounters: 07/07/21 : 158 lb (71.7 kg) 06/10/21 : 160 lb 9.6 oz (72.8 kg) 05/11/21 : 160 lb 9.6 oz (72.8 kg)  She is requesting a vitamin d level because she is planning to get implants.      Past Medical History:  Diagnosis Date   Abnormal glucose level    High cholesterol    Hypertension    Lumbago    Sciatica    Vitamin D deficiency      Family History  Problem Relation Age of Onset   Heart disease Mother    Diabetes Mother    Diabetes Brother    Diabetes Sister      Current Outpatient Medications:    Ascorbic Acid (VITAMIN C) 100 MG tablet, Take 100 mg by mouth as needed., Disp: , Rfl:    cholecalciferol (VITAMIN D3) 25 MCG (1000 UT) tablet, Take 1,000 Units by mouth daily., Disp: , Rfl:    co-enzyme Q-10 30 MG capsule, Take 30 mg by mouth daily., Disp: , Rfl:    methocarbamol (ROBAXIN) 500 MG tablet, Take 1 tablet (500 mg total) by mouth every 8 (eight) hours as needed for muscle spasms., Disp: 20 tablet, Rfl: 0   Multiple Vitamin (MULTIVITAMIN) tablet, Take 1 tablet by mouth daily., Disp: , Rfl:    NON FORMULARY, Green protein drink, Disp: , Rfl:    NON FORMULARY, Black seed drink, Disp: , Rfl:    Omega-3-6-9 CAPS, Take by mouth., Disp: , Rfl:     Saccharomyces boulardii (PROBIOTIC) 250 MG CAPS, Take 1 capsule by mouth 2 (two) times daily., Disp: 60 capsule, Rfl: 2   spironolactone (ALDACTONE) 25 MG tablet, Take 1 tablet (25 mg total) by mouth daily., Disp: 90 tablet, Rfl: 1   valsartan-hydrochlorothiazide (DIOVAN-HCT) 320-25 MG tablet, Take 1 tablet by mouth daily., Disp: 90 tablet, Rfl: 1   verapamil (CALAN-SR) 240 MG CR tablet, Take 1 tablet (240 mg total) by mouth at bedtime., Disp: 100 tablet, Rfl: 3   Allergies  Allergen Reactions   Penicillins Rash      The patient states she is post menopausal status. No LMP recorded. Patient is postmenopausal.. Negative for Dysmenorrhea and Negative for Menorrhagia. Negative for: breast discharge, breast lump(s), breast pain and breast self exam. Associated symptoms include abnormal vaginal bleeding. Pertinent negatives include abnormal bleeding (hematology), anxiety, decreased libido, depression, difficulty falling sleep, dyspareunia, history of infertility, nocturia, sexual dysfunction, sleep disturbances, urinary incontinence, urinary urgency, vaginal discharge and vaginal itching. Diet regular - eats fish, chicken or vegetables. The patient states her exercise level is none.   The patient's tobacco use is:  Social History   Tobacco Use  Smoking Status Never  Smokeless Tobacco Never   She has been exposed to passive  smoke. The patient's alcohol use is:  Social History   Substance and Sexual Activity  Alcohol Use No    Review of Systems  Constitutional: Negative.   HENT: Negative.    Eyes: Negative.   Respiratory: Negative.    Cardiovascular: Negative.   Gastrointestinal: Negative.   Endocrine: Negative.   Genitourinary: Negative.   Musculoskeletal: Negative.   Skin: Negative.   Allergic/Immunologic: Negative.   Neurological: Negative.   Hematological: Negative.   Psychiatric/Behavioral: Negative.      Today's Vitals   07/07/21 1003  BP: 132/78  Pulse: 74  Temp:  98.3 F (36.8 C)  TempSrc: Oral  Weight: 158 lb (71.7 kg)  Height: $Remove'5\' 4"'TcOPfnS$  (1.626 m)   Body mass index is 27.12 kg/m.   Objective:  Physical Exam Vitals reviewed.  Constitutional:      General: She is not in acute distress.    Appearance: Normal appearance. She is well-developed. She is obese.  HENT:     Head: Normocephalic and atraumatic.     Right Ear: Hearing, tympanic membrane, ear canal and external ear normal. There is no impacted cerumen.     Left Ear: Hearing, tympanic membrane, ear canal and external ear normal. There is no impacted cerumen.     Nose:     Comments: Deferred - masked    Mouth/Throat:     Comments: Deferred - masked Eyes:     General: Lids are normal.     Extraocular Movements: Extraocular movements intact.     Conjunctiva/sclera: Conjunctivae normal.     Pupils: Pupils are equal, round, and reactive to light.     Funduscopic exam:    Right eye: No papilledema.        Left eye: No papilledema.  Neck:     Thyroid: No thyroid mass.     Vascular: No carotid bruit.  Cardiovascular:     Rate and Rhythm: Normal rate and regular rhythm.     Pulses: Normal pulses.     Heart sounds: Normal heart sounds. No murmur heard. Pulmonary:     Effort: Pulmonary effort is normal. No respiratory distress.     Breath sounds: Normal breath sounds. No wheezing.  Chest:     Chest wall: No mass.  Breasts:    Tanner Score is 5.     Right: Normal. No mass or tenderness.     Left: Normal. No mass or tenderness.  Abdominal:     General: Abdomen is flat. Bowel sounds are normal. There is no distension.     Palpations: Abdomen is soft.     Tenderness: There is no abdominal tenderness.  Musculoskeletal:        General: No swelling or tenderness. Normal range of motion.     Cervical back: Full passive range of motion without pain, normal range of motion and neck supple.     Right lower leg: No edema.     Left lower leg: No edema.  Lymphadenopathy:     Upper Body:      Right upper body: No supraclavicular, axillary or pectoral adenopathy.     Left upper body: No supraclavicular, axillary or pectoral adenopathy.  Skin:    General: Skin is warm and dry.     Capillary Refill: Capillary refill takes less than 2 seconds.  Neurological:     General: No focal deficit present.     Mental Status: She is alert and oriented to person, place, and time.     Cranial Nerves: No cranial  nerve deficit.     Sensory: No sensory deficit.     Motor: No weakness.  Psychiatric:        Mood and Affect: Mood normal.        Behavior: Behavior normal.        Thought Content: Thought content normal.        Judgment: Judgment normal.        Assessment And Plan:     1. Encounter for general adult medical examination w/o abnormal findings Behavior modifications discussed and diet history reviewed.   Pt will continue to exercise regularly and modify diet with low GI, plant based foods and decrease intake of processed foods.  Recommend intake of daily multivitamin, Vitamin D, and calcium.  Recommend mammogram and cologuard for preventive screenings, as well as recommend immunizations that include influenza, TDAP, and Shingles  2. Encounter for screening mammogram for breast cancer Pt instructed on Self Breast Exam.According to ACOG guidelines Women aged 68 and older are recommended to get an annual mammogram. Form completed and given to patient contact the [facility] for appointment scheduing.  Pt encouraged to get annual mammogram - MM 3D SCREEN BREAST BILATERAL; Future  3. Essential hypertension Comments: Blood pressure is controlled, continue current medications.  - CMP14+EGFR - POCT Urinalysis Dipstick (81002) - Microalbumin / Creatinine Urine Ratio  4. Prediabetes Comments: Stable, diet controlled, continue limiting intake of sugary foods and drinks.  - Hemoglobin A1c - CMP14+EGFR - POCT Urinalysis Dipstick (81002) - Microalbumin / Creatinine Urine  Ratio  5. Elevated cholesterol Comments: Diet controlled, continue low fat diet and increasing fiber intake. - CMP14+EGFR - Lipid panel  6. Osteopenia of neck of right femur Comments: She is to take vitamin d as directed and add calcium  - VITAMIN D 25 Hydroxy (Vit-D Deficiency, Fractures)  7. Decreased estrogen level Comments: Will repeat bone density - DG BONE DENSITY (DXA); Future  8. Screen for colon cancer According to USPTF Colorectal cancer Screening guidelines. Cologuard recommended every 3 years, starting at age 62 years. Will refer to GI, she had a polyp at her last on in 2018  9. Other long term (current) drug therapy - CBC  10. Leukocytes in urine Comments: Small white cells, moderate blood in urine will treat for UTI and send urine for culture. - Culture, Urine     Patient was given opportunity to ask questions. Patient verbalized understanding of the plan and was able to repeat key elements of the plan. All questions were answered to their satisfaction.   Minette Brine, FNP   I, Minette Brine, FNP, have reviewed all documentation for this visit. The documentation on 07/07/21 for the exam, diagnosis, procedures, and orders are all accurate and complete.   THE PATIENT IS ENCOURAGED TO PRACTICE SOCIAL DISTANCING DUE TO THE COVID-19 PANDEMIC.

## 2021-07-07 NOTE — Patient Instructions (Signed)

## 2021-07-08 LAB — CBC
Hematocrit: 43.6 % (ref 34.0–46.6)
Hemoglobin: 15 g/dL (ref 11.1–15.9)
MCH: 31.4 pg (ref 26.6–33.0)
MCHC: 34.4 g/dL (ref 31.5–35.7)
MCV: 91 fL (ref 79–97)
Platelets: 187 10*3/uL (ref 150–450)
RBC: 4.77 x10E6/uL (ref 3.77–5.28)
RDW: 13.1 % (ref 11.7–15.4)
WBC: 5.6 10*3/uL (ref 3.4–10.8)

## 2021-07-08 LAB — CMP14+EGFR
ALT: 17 IU/L (ref 0–32)
AST: 23 IU/L (ref 0–40)
Albumin/Globulin Ratio: 1.5 (ref 1.2–2.2)
Albumin: 4.7 g/dL (ref 3.7–4.7)
Alkaline Phosphatase: 69 IU/L (ref 44–121)
BUN/Creatinine Ratio: 19 (ref 12–28)
BUN: 19 mg/dL (ref 8–27)
Bilirubin Total: 0.6 mg/dL (ref 0.0–1.2)
CO2: 23 mmol/L (ref 20–29)
Calcium: 9.9 mg/dL (ref 8.7–10.3)
Chloride: 99 mmol/L (ref 96–106)
Creatinine, Ser: 1.01 mg/dL — ABNORMAL HIGH (ref 0.57–1.00)
Globulin, Total: 3.1 g/dL (ref 1.5–4.5)
Glucose: 100 mg/dL — ABNORMAL HIGH (ref 70–99)
Potassium: 3.9 mmol/L (ref 3.5–5.2)
Sodium: 138 mmol/L (ref 134–144)
Total Protein: 7.8 g/dL (ref 6.0–8.5)
eGFR: 58 mL/min/{1.73_m2} — ABNORMAL LOW (ref >59)

## 2021-07-08 LAB — MICROALBUMIN / CREATININE URINE RATIO
Creatinine, Urine: 70.6 mg/dL
Microalb/Creat Ratio: 194 mg/g creat — ABNORMAL HIGH (ref 0–29)
Microalbumin, Urine: 137.1 ug/mL

## 2021-07-08 LAB — LIPID PANEL
Chol/HDL Ratio: 3.6 ratio (ref 0.0–4.4)
Cholesterol, Total: 213 mg/dL — ABNORMAL HIGH (ref 100–199)
HDL: 59 mg/dL (ref >39)
LDL Chol Calc (NIH): 139 mg/dL — ABNORMAL HIGH (ref 0–99)
Triglycerides: 82 mg/dL (ref 0–149)
VLDL Cholesterol Cal: 15 mg/dL (ref 5–40)

## 2021-07-08 LAB — HEMOGLOBIN A1C
Est. average glucose Bld gHb Est-mCnc: 134 mg/dL
Hgb A1c MFr Bld: 6.3 % — ABNORMAL HIGH (ref 4.8–5.6)

## 2021-07-08 LAB — VITAMIN D 25 HYDROXY (VIT D DEFICIENCY, FRACTURES): Vit D, 25-Hydroxy: 52.4 ng/mL (ref 30.0–100.0)

## 2021-07-11 LAB — URINE CULTURE

## 2021-07-15 ENCOUNTER — Other Ambulatory Visit: Payer: Self-pay | Admitting: Nurse Practitioner

## 2021-07-15 MED ORDER — NITROFURANTOIN MONOHYD MACRO 100 MG PO CAPS: 100.0000 mg | ORAL_CAPSULE | Freq: Two times a day (BID) | ORAL | 0 refills | Status: AC

## 2021-07-27 ENCOUNTER — Ambulatory Visit
Admission: RE | Admit: 2021-07-27 | Discharge: 2021-07-27 | Disposition: A | Discharge status: Home / Self Care | Payer: Medicare Other | Source: Ambulatory Visit | Attending: Nurse Practitioner | Admitting: Nurse Practitioner

## 2021-07-27 DIAGNOSIS — Z1231 Encounter for screening mammogram for malignant neoplasm of breast: Secondary | ICD-10-CM | POA: Diagnosis not present

## 2021-08-17 ENCOUNTER — Other Ambulatory Visit: Payer: Self-pay | Admitting: Nurse Practitioner

## 2021-08-17 DIAGNOSIS — I10 Essential (primary) hypertension: Secondary | ICD-10-CM

## 2021-11-09 ENCOUNTER — Encounter: Payer: Self-pay | Admitting: Nurse Practitioner

## 2021-11-09 ENCOUNTER — Ambulatory Visit (INDEPENDENT_AMBULATORY_CARE_PROVIDER_SITE_OTHER): Payer: Medicare Other | Admitting: Nurse Practitioner

## 2021-11-09 VITALS — BP 136/90 | HR 71 | Temp 98.1°F | Ht 64.0 in | Wt 156.2 lb

## 2021-11-09 DIAGNOSIS — Z79899 Other long term (current) drug therapy: Secondary | ICD-10-CM

## 2021-11-09 DIAGNOSIS — E7800 Pure hypercholesterolemia, unspecified: Secondary | ICD-10-CM | POA: Diagnosis not present

## 2021-11-09 DIAGNOSIS — I10 Essential (primary) hypertension: Secondary | ICD-10-CM | POA: Diagnosis not present

## 2021-11-09 DIAGNOSIS — Z23 Encounter for immunization: Secondary | ICD-10-CM

## 2021-11-09 DIAGNOSIS — R7303 Prediabetes: Secondary | ICD-10-CM

## 2021-11-09 DIAGNOSIS — N3000 Acute cystitis without hematuria: Secondary | ICD-10-CM | POA: Diagnosis not present

## 2021-11-09 DIAGNOSIS — M85851 Other specified disorders of bone density and structure, right thigh: Secondary | ICD-10-CM

## 2021-11-09 DIAGNOSIS — E2839 Other primary ovarian failure: Secondary | ICD-10-CM

## 2021-11-09 LAB — BMP8+EGFR
BUN/Creatinine Ratio: 20 (ref 12–28)
BUN: 17 mg/dL (ref 8–27)
CO2: 25 mmol/L (ref 20–29)
Calcium: 10 mg/dL (ref 8.7–10.3)
Chloride: 101 mmol/L (ref 96–106)
Creatinine, Ser: 0.87 mg/dL (ref 0.57–1.00)
Glucose: 122 mg/dL — ABNORMAL HIGH (ref 70–99)
Potassium: 4.1 mmol/L (ref 3.5–5.2)
Sodium: 140 mmol/L (ref 134–144)
eGFR: 70 mL/min/{1.73_m2} (ref >59)

## 2021-11-09 LAB — POCT URINALYSIS DIPSTICK
Bilirubin, UA: NEGATIVE
Blood, UA: NEGATIVE
Glucose, UA: NEGATIVE
Ketones, UA: NEGATIVE
Nitrite, UA: NEGATIVE
Protein, UA: NEGATIVE
Spec Grav, UA: >=1.03 — AB (ref 1.010–1.025)
Urobilinogen, UA: 0.2 E.U./dL
pH, UA: 5.5 (ref 5.0–8.0)

## 2021-11-09 NOTE — Progress Notes (Signed)
I,[name],acting as a Education administrator for Minette Brine, FNP.,have documented all relevant documentation on the behalf of Minette Brine, FNP,as directed by  Minette Brine, FNP while in the presence of Minette Brine, [place].   Subjective:     Patient ID: Julia Gates , female    DOB: 1945-06-19 , 76 y.o.   MRN: [id number]   Chief Complaint  Patient presents with   Hypertension    HPI  The patient is here today for a blood pressure f/u. She reports she did take her medications today. She feels like her blood pressure is up due to her having to drive. She did not take her blood pressure medications yesterday - she did not take any of them yesterday. She reports having a "trying weekend" which caused her to forget.   She reports still taking valsartan-hydrochlorothiazide 320-25. Not on patients list until I added today. She recently also received a refill.     Patient also states she spoke with GI specialist, they told her she did not need her colonoscopy for 2 more years. Unless she was having a current issue.   Reports her blood pressure is from 120'-160's. She feels the average is in the 130's  Hypertension This is a chronic problem. The current episode started more than 1 year ago. Pertinent negatives include no chest pain or palpitations.     Past Medical History:  Diagnosis Date   Abnormal glucose level    High cholesterol    Hypertension    Lumbago    Sciatica    Vitamin D deficiency      Family History  Problem Relation Age of Onset   Heart disease Mother    Diabetes Mother    Diabetes Brother    Diabetes Sister      Current Outpatient Medications:    Ascorbic Acid (VITAMIN C) 100 MG tablet, Take 100 mg by mouth as needed., Disp: , Rfl:    cholecalciferol (VITAMIN D3) 25 MCG (1000 UT) tablet, Take 1,000 Units by mouth daily., Disp: , Rfl:    co-enzyme Q-10 30 MG capsule, Take 30 mg by mouth daily., Disp: , Rfl:    DIOVAN HCT 320-25 MG tablet, TAKE ONE TABLET BY  MOUTH EVERY DAY, Disp: 90 tablet, Rfl: 1   methocarbamol (ROBAXIN) 500 MG tablet, Take 1 tablet (500 mg total) by mouth every 8 (eight) hours as needed for muscle spasms., Disp: 20 tablet, Rfl: 0   Multiple Vitamin (MULTIVITAMIN) tablet, Take 1 tablet by mouth daily., Disp: , Rfl:    NON FORMULARY, Green protein drink, Disp: , Rfl:    NON FORMULARY, Black seed drink, Disp: , Rfl:    Omega-3-6-9 CAPS, Take by mouth., Disp: , Rfl:    Saccharomyces boulardii (PROBIOTIC) 250 MG CAPS, Take 1 capsule by mouth 2 (two) times daily., Disp: 60 capsule, Rfl: 2   spironolactone (ALDACTONE) 25 MG tablet, TAKE ONE TABLET BY MOUTH EVERY DAY, Disp: 90 tablet, Rfl: 1   valsartan-hydrochlorothiazide (DIOVAN-HCT) 320-25 MG tablet, Take 1 tablet by mouth daily., Disp: , Rfl:    verapamil (CALAN-SR) 240 MG CR tablet, Take 1 tablet (240 mg total) by mouth at bedtime., Disp: 100 tablet, Rfl: 3   Allergies  Allergen Reactions   Penicillins Rash     Review of Systems  Constitutional: Negative.   Respiratory: Negative.    Cardiovascular: Negative.  Negative for chest pain, palpitations and leg swelling.  Neurological: Negative.   Psychiatric/Behavioral: Negative.       Today's  Vitals   11/09/21 0914 11/09/21 0946  BP: (!) 160/100 (!) 136/90  Pulse: 71   Temp: 98.1 F (36.7 C)   SpO2: 98%   Weight: 156 lb 3.2 oz (70.9 kg)   Height: 5' 4" (1.626 m)    Body mass index is 26.81 kg/m.  Wt Readings from Last 3 Encounters:  11/09/21 156 lb 3.2 oz (70.9 kg)  07/07/21 158 lb (71.7 kg)  06/10/21 160 lb 9.6 oz (72.8 kg)    Objective:  Physical Exam Vitals reviewed.  Constitutional:      General: She is not in acute distress.    Appearance: Normal appearance.  Cardiovascular:     Rate and Rhythm: Normal rate and regular rhythm.     Pulses: Normal pulses.     Heart sounds: Normal heart sounds. No murmur heard. Pulmonary:     Effort: Pulmonary effort is normal. No respiratory distress.     Breath  sounds: Normal breath sounds. No wheezing.  Skin:    General: Skin is warm and dry.     Capillary Refill: Capillary refill takes less than 2 seconds.  Neurological:     General: No focal deficit present.     Mental Status: She is alert and oriented to person, place, and time.     Cranial Nerves: No cranial nerve deficit.     Motor: No weakness.  Psychiatric:        Mood and Affect: Mood normal.        Behavior: Behavior normal.        Thought Content: Thought content normal.        Judgment: Judgment normal.         Assessment And Plan:     1. Essential hypertension Comments: Blood pressure is slightly elevated, reports was rushing to get to the office. Encouraged to stay well hydrated and to take medications regularly. Blood pressure improved with repeat.  Will check her blood pressure the next week and call with her readings if remains elevated will increase her spironolactone - BMP8+eGFR  2. Prediabetes Comments: Stable, diet controlled. Continue focusing on healthy diet low in sugar and carbs  3. Elevated cholesterol Comments: No current medications, stable. Continue low fat diet  4. Osteopenia of neck of right femur  5. Decreased estrogen level  6. Other long term (current) drug therapy  7. Acute cystitis without hematuria Comments: Continues to have symptoms of UTI will recheck urine culture to ensure has cleared.  - POCT Urinalysis Dipstick (81002) - Culture, Urine  8. Need for Tdap vaccination Will give tetanus vaccine today while in office. Refer to order management. TDAP will be administered to adults 82-62 years old every 10 years. - Tdap vaccine greater than or equal to 7yo IM     Patient was given opportunity to ask questions. Patient verbalized understanding of the plan and was able to repeat key elements of the plan. All questions were answered to their satisfaction.  Minette Brine, FNP   I, Minette Brine, FNP, have reviewed all documentation for this  visit. The documentation on 11/09/21 for the exam, diagnosis, procedures, and orders are all accurate and complete.   IF YOU HAVE BEEN REFERRED TO A SPECIALIST, IT MAY TAKE 1-2 WEEKS TO SCHEDULE/PROCESS THE REFERRAL. IF YOU HAVE NOT HEARD FROM US/SPECIALIST IN TWO WEEKS, PLEASE GIVE [place] A CALL AT [phone].   THE PATIENT IS ENCOURAGED TO PRACTICE SOCIAL DISTANCING DUE TO THE COVID-19 PANDEMIC.

## 2021-11-09 NOTE — Patient Instructions (Signed)
Hypertension, Adult ?Hypertension is another name for high blood pressure. High blood pressure forces your heart to work harder to pump blood. This can cause problems over time. ?There are two numbers in a blood pressure reading. There is a top number (systolic) over a bottom number (diastolic). It is best to have a blood pressure that is below 120/80. ?What are the causes? ?The cause of this condition is not known. Some other conditions can lead to high blood pressure. ?What increases the risk? ?Some lifestyle factors can make you more likely to develop high blood pressure: ?Smoking. ?Not getting enough exercise or physical activity. ?Being overweight. ?Having too much fat, sugar, calories, or salt (sodium) in your diet. ?Drinking too much alcohol. ?Other risk factors include: ?Having any of these conditions: ?Heart disease. ?Diabetes. ?High cholesterol. ?Kidney disease. ?Obstructive sleep apnea. ?Having a family history of high blood pressure and high cholesterol. ?Age. The risk increases with age. ?Stress. ?What are the signs or symptoms? ?High blood pressure may not cause symptoms. Very high blood pressure (hypertensive crisis) may cause: ?Headache. ?Fast or uneven heartbeats (palpitations). ?Shortness of breath. ?Nosebleed. ?Vomiting or feeling like you may vomit (nauseous). ?Changes in how you see. ?Very bad chest pain. ?Feeling dizzy. ?Seizures. ?How is this treated? ?This condition is treated by making healthy lifestyle changes, such as: ?Eating healthy foods. ?Exercising more. ?Drinking less alcohol. ?Your doctor may prescribe medicine if lifestyle changes do not help enough and if: ?Your top number is above 130. ?Your bottom number is above 80. ?Your personal target blood pressure may vary. ?Follow these instructions at home: ?Eating and drinking ? ?If told, follow the DASH eating plan. To follow this plan: ?Fill one half of your plate at each meal with fruits and vegetables. ?Fill one fourth of your plate  at each meal with whole grains. Whole grains include whole-wheat pasta, brown rice, and whole-grain bread. ?Eat or drink low-fat dairy products, such as skim milk or low-fat yogurt. ?Fill one fourth of your plate at each meal with low-fat (lean) proteins. Low-fat proteins include fish, chicken without skin, eggs, beans, and tofu. ?Avoid fatty meat, cured and processed meat, or chicken with skin. ?Avoid pre-made or processed food. ?Limit the amount of salt in your diet to less than 1,500 mg each day. ?Do not drink alcohol if: ?Your doctor tells you not to drink. ?You are pregnant, may be pregnant, or are planning to become pregnant. ?If you drink alcohol: ?Limit how much you have to: ?0-1 drink a day for women. ?0-2 drinks a day for men. ?Know how much alcohol is in your drink. In the [place] one drink equals one 12 oz bottle of beer (355 mL), one 5 oz glass of wine (148 mL), or one 1? oz glass of hard liquor (44 mL). ?Lifestyle ? ?Work with your doctor to stay at a healthy weight or to lose weight. Ask your doctor what the best weight is for you. ?Get at least 30 minutes of exercise that causes your heart to beat faster (aerobic exercise) most days of the week. This may include walking, swimming, or biking. ?Get at least 30 minutes of exercise that strengthens your muscles (resistance exercise) at least 3 days a week. This may include lifting weights or doing Pilates. ?Do not smoke or use any products that contain nicotine or tobacco. If you need help quitting, ask your doctor. ?Check your blood pressure at home as told by your doctor. ?Keep all follow-up visits. ?Medicines ?Take over-the-counter and prescription medicines   only as told by your doctor. Follow directions carefully. ?Do not skip doses of blood pressure medicine. The medicine does not work as well if you skip doses. Skipping doses also puts you at risk for problems. ?Ask your doctor about side effects or reactions to medicines that you should watch  for. ?Contact a doctor if: ?You think you are having a reaction to the medicine you are taking. ?You have headaches that keep coming back. ?You feel dizzy. ?You have swelling in your ankles. ?You have trouble with your vision. ?Get help right away if: ?You get a very bad headache. ?You start to feel mixed up (confused). ?You feel weak or numb. ?You feel faint. ?You have very bad pain in your: ?Chest. ?Belly (abdomen). ?You vomit more than once. ?You have trouble breathing. ?These symptoms may be an emergency. Get help right away. Call 911. ?Do not wait to see if the symptoms will go away. ?Do not drive yourself to the hospital. ?Summary ?Hypertension is another name for high blood pressure. ?High blood pressure forces your heart to work harder to pump blood. ?For most people, a normal blood pressure is less than 120/80. ?Making healthy choices can help lower blood pressure. If your blood pressure does not get lower with healthy choices, you may need to take medicine. ?This information is not intended to replace advice given to you by your health care provider. Make sure you discuss any questions you have with your health care provider. ?Document Revised: 12/10/2020 Document Reviewed: 12/10/2020 ?Elsevier Patient Education ? 2023 [employer] ? ?

## 2021-11-11 LAB — URINE CULTURE

## 2021-11-12 ENCOUNTER — Other Ambulatory Visit: Payer: Self-pay | Admitting: Nurse Practitioner

## 2021-11-12 DIAGNOSIS — N3000 Acute cystitis without hematuria: Secondary | ICD-10-CM

## 2021-11-12 MED ORDER — CIPROFLOXACIN HCL 500 MG PO TABS: 500.0000 mg | ORAL_TABLET | Freq: Two times a day (BID) | ORAL | 0 refills | Status: AC

## 2021-11-16 ENCOUNTER — Other Ambulatory Visit: Payer: Self-pay

## 2021-12-06 ENCOUNTER — Ambulatory Visit: Payer: Medicare Other | Admitting: Nurse Practitioner

## 2021-12-06 ENCOUNTER — Other Ambulatory Visit: Payer: Medicare Other

## 2021-12-06 DIAGNOSIS — R82998 Other abnormal findings in urine: Secondary | ICD-10-CM | POA: Diagnosis not present

## 2021-12-07 LAB — URINE CULTURE

## 2021-12-16 ENCOUNTER — Encounter: Payer: Self-pay | Admitting: Cardiology

## 2021-12-16 ENCOUNTER — Ambulatory Visit: Payer: Medicare Other | Admitting: Cardiology

## 2021-12-16 VITALS — BP 160/84 | HR 75 | Temp 98.6°F | Resp 16 | Ht 64.0 in | Wt 158.4 lb

## 2021-12-16 DIAGNOSIS — R7303 Prediabetes: Secondary | ICD-10-CM | POA: Diagnosis not present

## 2021-12-16 DIAGNOSIS — E7800 Pure hypercholesterolemia, unspecified: Secondary | ICD-10-CM | POA: Diagnosis not present

## 2021-12-16 DIAGNOSIS — I10 Essential (primary) hypertension: Secondary | ICD-10-CM | POA: Diagnosis not present

## 2021-12-16 MED ORDER — CLONIDINE 0.1 MG/24HR TD PTWK: 0.1000 mg | MEDICATED_PATCH | TRANSDERMAL | 3 refills | Status: DC

## 2021-12-16 NOTE — Progress Notes (Signed)
Primary Physician/Referring:  Minette Brine, FNP  Patient ID: Julia Gates, female    DOB: June 07, 1945, 76 y.o.   MRN: [id number]  Chief Complaint  Patient presents with   Hypertension   Hyperlipidemia   Follow-up    6 months    HPI: Julia Gates  is a 76 y.o. female  Afro-American female with difficult control hypertension, diet-controlled diabetes and hypertension with hypertensive heart.  She does not want to be on statins, she is also very particular about the medications.  She presents here for annual visit for management of hypertension.  She remains asymptomatic. Past Medical History:  Diagnosis Date   Abnormal glucose level    High cholesterol    Hypertension    Lumbago    Sciatica    Vitamin D deficiency     Past Surgical History:  Procedure Laterality Date   CATARACT EXTRACTION Right 12/2018   CATARACT EXTRACTION, BILATERAL  01/2019   TUBAL LIGATION     age 38   Social History   Tobacco Use   Smoking status: Never   Smokeless tobacco: Never  Substance Use Topics   Alcohol use: No   Marital Status: Married    Current Outpatient Medications:    Ascorbic Acid (VITAMIN C) 100 MG tablet, Take 100 mg by mouth as needed., Disp: , Rfl:    cholecalciferol (VITAMIN D3) 25 MCG (1000 UT) tablet, Take 1,000 Units by mouth daily., Disp: , Rfl:    cloNIDine (CATAPRES - DOSED IN MG/24 HR) 0.1 mg/24hr patch, Place 1 patch (0.1 mg total) onto the skin once a week., Disp: 4 patch, Rfl: 3   co-enzyme Q-10 30 MG capsule, Take 30 mg by mouth daily., Disp: , Rfl:    Multiple Vitamin (MULTIVITAMIN) tablet, Take 1 tablet by mouth daily., Disp: , Rfl:    NON FORMULARY, Green protein drink, Disp: , Rfl:    NON FORMULARY, Black seed drink, Disp: , Rfl:    Omega-3-6-9 CAPS, Take by mouth., Disp: , Rfl:    Saccharomyces boulardii (PROBIOTIC) 250 MG CAPS, Take 1 capsule by mouth 2 (two) times daily., Disp: 60 capsule, Rfl: 2   spironolactone (ALDACTONE) 25 MG tablet, TAKE ONE  TABLET BY MOUTH EVERY DAY, Disp: 90 tablet, Rfl: 1   valsartan-hydrochlorothiazide (DIOVAN-HCT) 320-25 MG tablet, Take 1 tablet by mouth daily., Disp: , Rfl:    verapamil (CALAN-SR) 240 MG CR tablet, Take 1 tablet (240 mg total) by mouth at bedtime., Disp: 100 tablet, Rfl: 3   Review of Systems  Cardiovascular:  Negative for chest pain, dyspnea on exertion and leg swelling.   Objective  Blood pressure (!) 160/84, pulse 75, temperature 98.6 F (37 C), temperature source Temporal, resp. rate 16, height _0  (1.626 m), weight 158 lb 6.4 oz (71.8 kg), SpO2 94 %. Body mass index is 27.19 kg/m.      12/16/2021   10:00 AM 12/16/2021    9:56 AM 11/09/2021    9:46 AM  Vitals with BMI  Height  _1    Weight  158 lbs 6 oz   BMI  41.28   Systolic 786 767 209  Diastolic 84 94 90  Pulse 75 75     Physical Exam Neck:     Vascular: No JVD.  Cardiovascular:     Rate and Rhythm: Normal rate and regular rhythm.     Pulses: Intact distal pulses.     Heart sounds: Normal heart sounds. No murmur heard.    No gallop.  Pulmonary:     Effort: Pulmonary effort is normal.     Breath sounds: Normal breath sounds.  Abdominal:     General: Bowel sounds are normal.     Palpations: Abdomen is soft.  Musculoskeletal:     Right lower leg: No edema.     Left lower leg: No edema.    Radiology: No results found.  Laboratory examination:   Lab Results  Component Value Date   NA 140 11/09/2021   K 4.1 11/09/2021   CO2 25 11/09/2021   GLUCOSE 122 (H) 11/09/2021   BUN 17 11/09/2021   CREATININE 0.87 11/09/2021   CALCIUM 10.0 11/09/2021   EGFR 70 11/09/2021   GFRNONAA 65 04/14/2020    Lab Results  Component Value Date   ALT 17 07/07/2021   AST 23 07/07/2021   ALKPHOS 69 07/07/2021   BILITOT 0.6 07/07/2021       Latest Ref Rng & Units 07/07/2021   10:42 AM 01/09/2020   11:19 AM 01/08/2019   12:46 PM  CBC  WBC 3.4 - 10.8 x10E3/uL 5.6  4.6  5.5   Hemoglobin 11.1 - 15.9 g/dL 15.0  13.7   13.6   Hematocrit 34.0 - 46.6 % 43.6  41.6  40.4   Platelets 150 - 450 x10E3/uL 187  239  207    Lipid Panel     Component Value Date/Time   CHOL 213 (H) 07/07/2021 1042   TRIG 82 07/07/2021 1042   HDL 59 07/07/2021 1042   CHOLHDL 3.6 07/07/2021 1042   LDLCALC 139 (H) 07/07/2021 1042   HEMOGLOBIN A1C Lab Results  Component Value Date   HGBA1C 6.3 (H) 07/07/2021   TSH No results for input(s): "TSH" in the last 8760 hours.  Cardiac Studies:   Nuclear stress test  [06-23-15]: 1. The resting electrocardiogram demonstrated normal sinus rhythm, RBBB and no resting arrhythmias. The stress electrocardiogram was normal. Patient exercised on Bruce protocol for 5:03 minutes and achieved 7.05 METS. Stress test terminated due to target heart rate( 87% MPHR) and dyspnea. 2. Myocardial perfusion imaging is normal. Overall left ventricular systolic function was normal without regional wall motion abnormalities. The left ventricular ejection fraction was 80%.  Renal Dopplers  [05/13/2015]: No evidence of renal artery occlusive disease in either renal artery. Normal intrarenal vascular perfusion is noted in both kidneys. Mild increase in the echogenicity of both kidneys suggest medico-renal disease. Normal sized kidneys. Compared to the study done on 01/09/14, right renal artery stenosis not evident in the present study.  Renal Dopplers  [01/09/2014]: Renal artery duplex: Hemodynamically significant stenosis of the right renal artery. Normal intrarenal vascular perfusion is noted in both kidneys. < 50% stenosis of the left renal artery ostium. Bilateral renal size is normal.  Echocardiogram 07/31/2018: Normal LV systolic function with EF 56%. Left ventricle cavity is normal in size. Moderate concentric hypertrophy of the left ventricle. Normal global wall motion. Doppler evidence of grade I (impaired) diastolic dysfunction, normal LAP. Calculated EF 56%. Left atrial cavity is mildly dilated. Mild  (Grade I) mitral regurgitation. Mild to moderate tricuspid regurgitation. Estimated pulmonary artery systolic pressure 24 mmHg, which is normal.  No significant change compared to previous study in 2015.  EKG:  EKG 12/08/2021: Normal sinus rhythm at rate of 68 bpm, left atrial enlargement, right bundle branch block.  No evidence of ischemia. No significant change from prior EKG  Assessment     ICD-10-CM   1. Primary hypertension  I10 EKG 12-Lead  2. Mild hypercholesterolemia  E78.00     3. Pre-diabetes  R73.03       Recommendations:   Ms. Julia Gates is a 76 y.o. Afro-American female with difficult control hypertension, diet-controlled diabetes and hypertension with hypertensive heart.  She does not want to be on statins, she is also very particular about the medications.  Blood pressure is elevated, again I discussed with her regarding risk of stroke, after discussions, she is willing to try Catapres patch.  I would like to see her back in 6 weeks for monitoring of her blood pressure specifically and if she remains stable I will see her back on an annual basis.  She likes to see me on an annual basis.  With regard to prediabetes mellitus, advised her about 8 to 10 pound weight loss would certainly help with both blood pressure control and also control of blood sugar.  Lipids are mildly elevated however she does not want to be on a statin therapy.  Probably could stop checking her lipids on annual basis.   Adrian Prows, MD, [facility] 12/16/2021, 10:21 AM Office: [phone] Fax: [phone] Pager: [phone]

## 2022-01-04 ENCOUNTER — Ambulatory Visit
Admission: RE | Admit: 2022-01-04 | Discharge: 2022-01-04 | Disposition: A | Discharge status: Home / Self Care | Payer: Medicare Other | Source: Ambulatory Visit | Attending: Nurse Practitioner | Admitting: Nurse Practitioner

## 2022-01-04 DIAGNOSIS — M8589 Other specified disorders of bone density and structure, multiple sites: Secondary | ICD-10-CM | POA: Diagnosis not present

## 2022-01-04 DIAGNOSIS — Z780 Asymptomatic menopausal state: Secondary | ICD-10-CM | POA: Diagnosis not present

## 2022-01-04 DIAGNOSIS — M810 Age-related osteoporosis without current pathological fracture: Secondary | ICD-10-CM | POA: Diagnosis not present

## 2022-01-04 DIAGNOSIS — E2839 Other primary ovarian failure: Secondary | ICD-10-CM

## 2022-01-17 ENCOUNTER — Other Ambulatory Visit (HOSPITAL_COMMUNITY)
Admission: RE | Admit: 2022-01-17 | Discharge: 2022-01-17 | Disposition: A | Discharge status: Home / Self Care | Payer: Medicare Other | Source: Ambulatory Visit | Attending: Nurse Practitioner | Admitting: Nurse Practitioner

## 2022-01-17 ENCOUNTER — Ambulatory Visit (INDEPENDENT_AMBULATORY_CARE_PROVIDER_SITE_OTHER): Payer: Medicare Other | Admitting: Nurse Practitioner

## 2022-01-17 ENCOUNTER — Encounter: Payer: Self-pay | Admitting: Nurse Practitioner

## 2022-01-17 VITALS — BP 118/64 | HR 77 | Temp 97.6°F | Ht 64.0 in | Wt 157.0 lb

## 2022-01-17 DIAGNOSIS — R09A2 Foreign body sensation, throat: Secondary | ICD-10-CM | POA: Diagnosis not present

## 2022-01-17 DIAGNOSIS — R8290 Unspecified abnormal findings in urine: Secondary | ICD-10-CM | POA: Insufficient documentation

## 2022-01-17 DIAGNOSIS — M25512 Pain in left shoulder: Secondary | ICD-10-CM | POA: Diagnosis not present

## 2022-01-17 DIAGNOSIS — M810 Age-related osteoporosis without current pathological fracture: Secondary | ICD-10-CM | POA: Diagnosis not present

## 2022-01-17 NOTE — Patient Instructions (Addendum)
Shoulder Pain Many things can cause shoulder pain, including: An injury. Moving the shoulder in the same way again and again (overuse). Joint pain (arthritis). Pain can come from: Swelling and irritation (inflammation) of any part of the shoulder. An injury to the shoulder joint. An injury to: Tissues that connect muscle to bone (tendons). Tissues that connect bones to each other (ligaments). Bones. Follow these instructions at home: Watch for changes in your symptoms. Let your doctor know about them. Follow these instructions to help with your pain. If you have a sling: Wear the sling as told by your doctor. Remove it only as told by your doctor. Loosen the sling if your fingers: Tingle. Become numb. Turn cold and blue. Keep the sling clean. If the sling is not waterproof: Do not let it get wet. Take the sling off when you shower or bathe. Managing pain, stiffness, and swelling  If told, put ice on the painful area: Put ice in a plastic bag. Place a towel between your skin and the bag. Leave the ice on for 20 minutes, 2-3 times a day. Stop putting ice on if it does not help with the pain. Squeeze a soft ball or a foam pad as much as possible. This prevents swelling in the shoulder. It also helps to strengthen the arm. General instructions Take over-the-counter and prescription medicines only as told by your doctor. Keep all follow-up visits as told by your doctor. This is important. Contact a doctor if: Your pain gets worse. Medicine does not help your pain. You have new pain in your arm, hand, or fingers. Get help right away if: Your arm, hand, or fingers: Tingle. Are numb. Are swollen. Are painful. Turn white or blue. Summary Shoulder pain can be caused by many things. These include injury, moving the shoulder in the same away again and again, and joint pain. Watch for changes in your symptoms. Let your doctor know about them. This condition may be treated with a  sling, ice, and pain medicine. Contact your doctor if the pain gets worse or you have new pain. Get help right away if your arm, hand, or fingers tingle or get numb, swollen, or painful. Keep all follow-up visits as told by your doctor. This is important. This information is not intended to replace advice given to you by your health care provider. Make sure you discuss any questions you have with your health care provider. Document Revised: 11/06/2020 Document Reviewed: 11/06/2020 Elsevier Patient Education  [place].  Alendronate Tablets What is this medication? ALENDRONATE (a LEN droe nate) prevents and treats osteoporosis. It may also be used to treat Paget disease of the bone. It works by Paramedic stronger and less likely to break (fracture). It belongs to a group of medications called bisphosphonates. This medicine may be used for other purposes; ask your health care provider or pharmacist if you have questions. COMMON BRAND NAME(S): Fosamax What should I tell my care team before I take this medication? They need to know if you have any of these conditions: Bleeding disorder Cancer Dental disease Difficulty swallowing Infection (fever, chills, cough, sore throat, pain or trouble passing urine) Kidney disease Low levels of calcium or other minerals in the blood Low red blood cell counts Receiving steroids like dexamethasone or prednisone Stomach or intestine problems Trouble sitting or standing for 30 minutes An unusual or allergic reaction to alendronate, other medications, foods, dyes or preservatives Pregnant or trying to get pregnant Breast-feeding How should I  use this medication? Take this medication by mouth with a full glass of water. Take it as directed on the prescription label at the same time every day. Take the dose right after waking up. Do not eat or drink anything before taking it. Do not take it with any other drink except water. Do not chew or  crush the tablet. After taking it, do not eat breakfast, drink, or take any other medications or vitamins for at least 30 minutes. Sit or stand up for at least 30 minutes after you take it. Do not lie down. Keep taking it unless your care team tells you to stop. A special MedGuide will be given to you by the pharmacist with each prescription and refill. Be sure to read this information carefully each time. Talk to your care team about the use of this medication in children. Special care may be needed. Overdosage: If you think you have taken too much of this medicine contact a poison control center or emergency room at once. NOTE: This medicine is only for you. Do not share this medicine with others. What if I miss a dose? If you take your medication once a day, skip it. Take your next dose at the scheduled time the next morning. Do not take two doses on the same day. If you take your medication once a week, take the missed dose on the morning after you remember. Do not take two doses on the same day. What may interact with this medication? Aluminum hydroxide Antacids Aspirin Calcium supplements Medications for inflammation like ibuprofen, naproxen, and others Iron supplements Magnesium supplements Vitamins with minerals This list may not describe all possible interactions. Give your health care provider a list of all the medicines, herbs, non-prescription drugs, or dietary supplements you use. Also tell them if you smoke, drink alcohol, or use illegal drugs. Some items may interact with your medicine. What should I watch for while using this medication? Visit your care team for regular checks on your progress. It may be some time before you see the benefit from this medication. Some people who take this medication have severe bone, joint, or muscle pain. This medication may also increase your risk for jaw problems or a broken thigh bone. Tell your care team right away if you have severe pain in  your jaw, bones, joints, or muscles. Tell you care team if you have any pain that does not go away or that gets worse. Tell your dentist and dental surgeon that you are taking this medication. You should not have major dental surgery while on this medication. See your dentist to have a dental exam and fix any dental problems before starting this medication. Take good care of your teeth while on this medication. Make sure you see your dentist for regular follow-up appointments. You should make sure you get enough calcium and vitamin D while you are taking this medication. Discuss the foods you eat and the vitamins you take with your care team. You may need blood work done while you are taking this medication. What side effects may I notice from receiving this medication? Side effects that you should report to your care team as soon as possible: Allergic reactions--skin rash, itching, hives, swelling of the face, lips, tongue, or throat Low calcium level--muscle pain or cramps, confusion, tingling, or numbness in the hands or feet Osteonecrosis of the jaw--pain, swelling, or redness in the mouth, numbness of the jaw, poor healing after dental work, unusual discharge from the  mouth, visible bones in the mouth Pain or trouble swallowing Severe bone, joint, or muscle pain Stomach bleeding--bloody or black, tar-like stools, vomiting blood or brown material that looks like coffee grounds Side effects that usually do not require medical attention (report to your care team if they continue or are bothersome): Constipation Diarrhea Nausea Stomach pain This list may not describe all possible side effects. Call your doctor for medical advice about side effects. You may report side effects to FDA at 1-800-FDA-1088. Where should I keep my medication? Keep out of the reach of children and pets. Store at room temperature between 15 and 30 degrees C (59 and 86 degrees F). Throw away any unused medication after  the expiration date. NOTE: This sheet is a summary. It may not cover all possible information. If you have questions about this medicine, talk to your doctor, pharmacist, or health care provider.  2023 Elsevier/Gold Standard (2020-02-17 00:00:00)   Osteoporosis  Osteoporosis happens when the bones become thin and less dense than normal. Osteoporosis makes bones more brittle and fragile and more likely to break (fracture). Over time, osteoporosis can cause your bones to become so weak that they fracture after a minor fall. Bones in the hip, wrist, and spine are most likely to fracture due to osteoporosis. What are the causes? The exact cause of this condition is not known. What increases the risk? You are more likely to develop this condition if you: Have family members with this condition. Have poor nutrition. Use the following: Steroid medicines, such as prednisone. Anti-seizure medicines. Nicotine or tobacco, such as cigarettes, e-cigarettes, and chewing tobacco. Are female. Are age 54 or older. Are not physically active (are sedentary). Are of European or Asian descent. Have a small body frame. What are the signs or symptoms? A fracture might be the first sign of osteoporosis, especially if the fracture results from a fall or injury that usually would not cause a bone to break. Other signs and symptoms include: Pain in the neck or low back. Stooped posture. Loss of height. How is this diagnosed? This condition may be diagnosed based on: Your medical history. A physical exam. A bone mineral density test, also called a DXA or DEXA test (dual-energy X-ray absorptiometry test). This test uses X-rays to measure the amount of minerals in your bones. How is this treated? This condition may be treated by: Making lifestyle changes, such as: Including foods with more calcium and vitamin D in your diet. Doing weight-bearing and muscle-strengthening exercises. Stopping tobacco  use. Limiting alcohol intake. Taking medicine to slow the process of bone loss or to increase bone density. Taking daily supplements of calcium and vitamin D. Taking hormone replacement medicines, such as estrogen for women and testosterone for men. Monitoring your levels of calcium and vitamin D. The goal of treatment is to strengthen your bones and lower your risk for a fracture. Follow these instructions at home: Eating and drinking Include calcium and vitamin D in your diet. Calcium is important for bone health, and vitamin D helps your body absorb calcium. Good sources of calcium and vitamin D include: Certain fatty fish, such as salmon and tuna. Products that have calcium and vitamin D added to them (are fortified), such as fortified cereals. Egg yolks. Cheese. Liver.  Activity Do exercises as told by your health care provider. Ask your health care provider what exercises and activities are safe for you. You should do: Exercises that make you work against gravity (weight-bearing exercises), such as tai  chi, yoga, or walking. Exercises to strengthen muscles, such as lifting weights. Lifestyle Do not drink alcohol if: Your health care provider tells you not to drink. You are pregnant, may be pregnant, or are planning to become pregnant. If you drink alcohol: Limit how much you use to: 0-1 drink a day for women. 0-2 drinks a day for men. Know how much alcohol is in your drink. In the [place] one drink equals one 12 oz bottle of beer (355 mL), one 5 oz glass of wine (148 mL), or one 1 oz glass of hard liquor (44 mL). Do not use any products that contain nicotine or tobacco, such as cigarettes, e-cigarettes, and chewing tobacco. If you need help quitting, ask your health care provider. Preventing falls Use devices to help you move around (mobility aids) as needed, such as canes, walkers, scooters, or crutches. Keep rooms well-lit and clutter-free. Remove tripping hazards from  walkways, including cords and throw rugs. Install grab bars in bathrooms and safety rails on stairs. Use rubber mats in the bathroom and other areas that are often wet or slippery. Wear closed-toe shoes that fit well and support your feet. Wear shoes that have rubber soles or low heels. Review your medicines with your health care provider. Some medicines can cause dizziness or changes in blood pressure, which can increase your risk of falling. General instructions Take over-the-counter and prescription medicines only as told by your health care provider. Keep all follow-up visits. This is important. Contact a health care provider if: You have never been screened for osteoporosis and you are: A woman who is age 86 or older. A man who is age 77 or older. Get help right away if: You fall or injure yourself. Summary Osteoporosis is thinning and loss of density in your bones. This makes bones more brittle and fragile and more likely to break (fracture),even with minor falls. The goal of treatment is to strengthen your bones and lower your risk for a fracture. Include calcium and vitamin D in your diet. Calcium is important for bone health, and vitamin D helps your body absorb calcium. Talk with your health care provider about screening for osteoporosis if you are a woman who is age 18 or older, or a man who is age 60 or older. This information is not intended to replace advice given to you by your health care provider. Make sure you discuss any questions you have with your health care provider. Document Revised: 08/08/2019 Document Reviewed: 08/08/2019 Elsevier Patient Education  [place].  Take 500 mg Tylenol twice a day for 5 days.

## 2022-01-17 NOTE — Progress Notes (Signed)
I,Julia Gates,acting as a Education administrator for [employer], FNP.,have documented all relevant documentation on the behalf of Julia Brine, FNP,as directed by  Julia Brine, FNP while in the presence of Julia Gates, Julia Gates. Subjective:     Patient ID: Julia Gates , female    DOB: [birth year] , 76 y.o.   MRN: [id number]   Chief Complaint  Patient presents with   Shoulder Pain    HPI  Patient presents today for shoulder pain she is unable to move her arm from the left to the right without having sharp pain and issues with throat - feels like something is stuck in her throat. She continues to have strong urine odor. She has been to Dr. Rip Harbour in the past for her right shoulder.   Shoulder Pain  The pain is present in the left shoulder. This is a new problem. The current episode started 1 to 4 weeks ago (3 weeks ago). There has been no history of extremity trauma. The problem occurs intermittently. The quality of the pain is described as aching. The pain is at a severity of 6/10 (when at its highest position). Pertinent negatives include no fever. The symptoms are aggravated by activity. She has tried heat for the symptoms. Family history does not include rheumatoid arthritis. There is no history of osteoarthritis.     Past Medical History:  Diagnosis Date   Abnormal glucose level    High cholesterol    Hypertension    Lumbago    Sciatica    Vitamin D deficiency      Family History  Problem Relation Age of Onset   Heart disease Mother    Diabetes Mother    Diabetes Brother    Diabetes Sister      Current Outpatient Medications:    Ascorbic Acid (VITAMIN C) 100 MG tablet, Take 100 mg by mouth as needed., Disp: , Rfl:    cholecalciferol (VITAMIN D3) 25 MCG (1000 UT) tablet, Take 1,000 Units by mouth daily., Disp: , Rfl:    cloNIDine (CATAPRES - DOSED IN MG/24 HR) 0.1 mg/24hr patch, Place 1 patch (0.1 mg total) onto the skin once a week., Disp: 4 patch, Rfl: 3   co-enzyme Q-10 30 MG  capsule, Take 30 mg by mouth daily., Disp: , Rfl:    Multiple Vitamin (MULTIVITAMIN) tablet, Take 1 tablet by mouth daily., Disp: , Rfl:    NON FORMULARY, Green protein drink, Disp: , Rfl:    NON FORMULARY, Black seed drink, Disp: , Rfl:    Omega-3-6-9 CAPS, Take by mouth., Disp: , Rfl:    Saccharomyces boulardii (PROBIOTIC) 250 MG CAPS, Take 1 capsule by mouth 2 (two) times daily., Disp: 60 capsule, Rfl: 2   spironolactone (ALDACTONE) 25 MG tablet, TAKE ONE TABLET BY MOUTH EVERY DAY, Disp: 90 tablet, Rfl: 1   valsartan-hydrochlorothiazide (DIOVAN-HCT) 320-25 MG tablet, Take 1 tablet by mouth daily., Disp: , Rfl:    verapamil (CALAN-SR) 240 MG CR tablet, Take 1 tablet (240 mg total) by mouth at bedtime., Disp: 100 tablet, Rfl: 3   Allergies  Allergen Reactions   Penicillins Rash     Review of Systems  Constitutional: Negative.  Negative for fever.  HENT:  Positive for trouble swallowing.        Swallowing difficulty in the last 2-3 weeks. She is not having trouble eating but when she tries to swallow she feels like it takes a few tries.   Respiratory: Negative.    Cardiovascular: Negative.  Negative for chest  pain, palpitations and leg swelling.  Gastrointestinal: Negative.   Endocrine: Negative.   Genitourinary: Negative.   Musculoskeletal:  Positive for arthralgias.  Neurological: Negative.   Psychiatric/Behavioral: Negative.       Today's Vitals   01/17/22 1509  BP: 118/64  Pulse: 77  Temp: 97.6 F (36.4 C)  TempSrc: Oral  Weight: 157 lb (71.2 kg)  Height: '5\' 4"'$  (1.626 m)   Body mass index is 26.95 kg/m.  Wt Readings from Last 3 Encounters:  01/17/22 157 lb (71.2 kg)  12/16/21 158 lb 6.4 oz (71.8 kg)  11/09/21 156 lb 3.2 oz (70.9 kg)    Objective:  Physical Exam Vitals reviewed.  Constitutional:      General: She is not in acute distress.    Appearance: Normal appearance.  Cardiovascular:     Rate and Rhythm: Normal rate and regular rhythm.     Pulses: Normal  pulses.     Heart sounds: Normal heart sounds. No murmur heard. Pulmonary:     Effort: Pulmonary effort is normal. No respiratory distress.     Breath sounds: Normal breath sounds. No wheezing.  Musculoskeletal:        General: No swelling or tenderness.     Comments: Decreased rom to left shoulder  Skin:    General: Skin is warm and dry.     Capillary Refill: Capillary refill takes less than 2 seconds.  Neurological:     General: No focal deficit present.     Mental Status: She is alert and oriented to person, place, and time.     Cranial Nerves: No cranial nerve deficit.     Motor: No weakness.  Psychiatric:        Mood and Affect: Mood normal.        Behavior: Behavior normal.        Thought Content: Thought content normal.        Judgment: Judgment normal.         Assessment And Plan:     1. Abnormal urine odor Comments: No abnormal finding on exam, continue to stay well hydrated with water - Urine cytology ancillary only  2. Age-related osteoporosis without current pathological fracture Comments: Declines medications to treat. Take vitamin d and calcium with low impact walking. Discussed risk of fractures hers is 20% in 10 years  3. Acute pain of left shoulder Comments: Take 500 mg Tylenol twice a day for 5 days. Tenderness to anterior bursa space. Will refer to Ortho due to decreased ROM. - Ambulatory referral to Orthopedic Surgery  4. Sensation of lump in throat Comments: Will refer to ENT for further evaluation - Ambulatory referral to ENT - TSH     Patient was given opportunity to ask questions. Patient verbalized understanding of the plan and was able to repeat key elements of the plan. All questions were answered to their satisfaction.  Julia Brine, FNP    I, Julia Brine, FNP, have reviewed all documentation for this visit. The documentation on 01/17/22 for the exam, diagnosis, procedures, and orders are all accurate and complete.  IF YOU HAVE BEEN  REFERRED TO A SPECIALIST, IT MAY TAKE 1-2 WEEKS TO SCHEDULE/PROCESS THE REFERRAL. IF YOU HAVE NOT HEARD FROM US/SPECIALIST IN TWO WEEKS, PLEASE GIVE [place] A CALL AT [phone] X [phone].   THE PATIENT IS ENCOURAGED TO PRACTICE SOCIAL DISTANCING DUE TO THE COVID-19 PANDEMIC.

## 2022-01-20 LAB — URINE CYTOLOGY ANCILLARY ONLY
Bacterial Vaginitis-Urine: NEGATIVE
Candida Urine: NEGATIVE
Chlamydia: NEGATIVE
Comment: NEGATIVE
Comment: NEGATIVE
Comment: NORMAL
Neisseria Gonorrhea: NEGATIVE
Trichomonas: NEGATIVE

## 2022-01-26 ENCOUNTER — Ambulatory Visit: Payer: Medicare Other

## 2022-02-02 DIAGNOSIS — M25512 Pain in left shoulder: Secondary | ICD-10-CM | POA: Diagnosis not present

## 2022-02-02 DIAGNOSIS — M25562 Pain in left knee: Secondary | ICD-10-CM | POA: Diagnosis not present

## 2022-02-03 ENCOUNTER — Ambulatory Visit: Payer: Medicare Other

## 2022-02-03 ENCOUNTER — Ambulatory Visit (INDEPENDENT_AMBULATORY_CARE_PROVIDER_SITE_OTHER): Payer: Medicare Other

## 2022-02-03 VITALS — Ht 64.0 in | Wt 160.0 lb

## 2022-02-03 VITALS — BP 163/84 | HR 71 | Ht 64.0 in | Wt 162.0 lb

## 2022-02-03 DIAGNOSIS — I10 Essential (primary) hypertension: Secondary | ICD-10-CM | POA: Diagnosis not present

## 2022-02-03 DIAGNOSIS — Z0000 Encounter for general adult medical examination without abnormal findings: Secondary | ICD-10-CM | POA: Diagnosis not present

## 2022-02-03 NOTE — Progress Notes (Signed)
I connected with Julia Gates today by telephone and verified that I am speaking with the correct person using two identifiers. Location patient: home Location provider: work Persons participating in the virtual visit: [place], [place]if[place] LPN.   I discussed the limitations, risks, security and privacy concerns of performing an evaluation and management service by telephone and the availability of in person appointments. I also discussed with the patient that there may be a patient responsible charge related to this service. The patient expressed understanding and verbally consented to this telephonic visit.    Interactive audio and video telecommunications were attempted between this provider and patient, however failed, due to patient having technical difficulties OR patient did not have access to video capability.  We continued and completed visit with audio only.     Vital signs may be patient reported or missing.  Subjective:   Julia Gates is a 76 y.o. female who presents for Medicare Annual (Subsequent) preventive examination.  Review of Systems     Cardiac Risk Factors include: advanced age (>12mn, >>49women);hypertension     Objective:    Today's Vitals   02/03/22 1136  Weight: 160 lb (72.6 kg)  Height: '5\' 4"'$  (1.626 m)  PainSc: 6    Body mass index is 27.46 kg/m.     02/03/2022   11:40 AM 01/14/2021    9:47 AM 01/09/2020   10:17 AM 01/08/2019    9:50 AM 12/28/2017   10:03 AM  Advanced Directives  Does Patient Have a Medical Advance Directive? No No No No No  Would patient like information on creating a medical advance directive?  Yes (MAU/Ambulatory/Procedural Areas - Information given) Yes (MAU/Ambulatory/Procedural Areas - Information given)  Yes (MAU/Ambulatory/Procedural Areas - Information given)    Current Medications (verified) Outpatient Encounter Medications as of 02/03/2022  Medication Sig   Ascorbic Acid (VITAMIN C) 100 MG tablet  Take 100 mg by mouth as needed.   cholecalciferol (VITAMIN D3) 25 MCG (1000 UT) tablet Take 1,000 Units by mouth daily.   co-enzyme Q-10 30 MG capsule Take 30 mg by mouth daily.   Multiple Vitamin (MULTIVITAMIN) tablet Take 1 tablet by mouth daily.   NON FORMULARY Green protein drink   NON FORMULARY Black seed drink   Omega-3-6-9 CAPS Take by mouth.   Saccharomyces boulardii (PROBIOTIC) 250 MG CAPS Take 1 capsule by mouth 2 (two) times daily.   spironolactone (ALDACTONE) 25 MG tablet TAKE ONE TABLET BY MOUTH EVERY DAY   valsartan-hydrochlorothiazide (DIOVAN-HCT) 320-25 MG tablet Take 1 tablet by mouth daily.   verapamil (CALAN-SR) 240 MG CR tablet Take 1 tablet (240 mg total) by mouth at bedtime.   cloNIDine (CATAPRES - DOSED IN MG/24 HR) 0.1 mg/24hr patch Place 1 patch (0.1 mg total) onto the skin once a week. (Patient not taking: Reported on 02/03/2022)   No facility-administered encounter medications on file as of 02/03/2022.    Allergies (verified) Penicillins   History: Past Medical History:  Diagnosis Date   Abnormal glucose level    High cholesterol    Hypertension    Lumbago    Sciatica    Vitamin D deficiency    Past Surgical History:  Procedure Laterality Date   CATARACT EXTRACTION Right 12/2018   CATARACT EXTRACTION, BILATERAL  01/2019   TUBAL LIGATION     age 76  Family History  Problem Relation Age of Onset   Heart disease Mother    Diabetes Mother    Diabetes Brother  Diabetes Sister    Social History   Socioeconomic History   Marital status: Married    Spouse name: Not on file   Number of children: 3   Years of education: Not on file   Highest education level: Not on file  Occupational History   Occupation: retired  Tobacco Use   Smoking status: Never   Smokeless tobacco: Never  Vaping Use   Vaping Use: Never used  Substance and Sexual Activity   Alcohol use: No   Drug use: No   Sexual activity: Yes  Other Topics Concern   Not on file   Social History Narrative   Patient lives at home with her husband and she is retired. She has a high school education and 3 children   Social Determinants of Health   Financial Resource Strain: Low Risk  (02/03/2022)   Overall Financial Resource Strain (CARDIA)    Difficulty of Paying Living Expenses: Not hard at all  Food Insecurity: No Food Insecurity (02/03/2022)   Hunger Vital Sign    Worried About Running Out of Food in the Last Year: Never true    Ran Out of Food in the Last Year: Never true  Transportation Needs: No Transportation Needs (02/03/2022)   PRAPARE - Hydrologist (Medical): No    Lack of Transportation (Non-Medical): No  Physical Activity: Sufficiently Active (02/03/2022)   Exercise Vital Sign    Days of Exercise per Week: 3 days    Minutes of Exercise per Session: 60 min  Stress: No Stress Concern Present (02/03/2022)   [place]    Feeling of Stress : Only a little  Social Connections: Not on file    Tobacco Counseling Counseling given: Not Answered   Clinical Intake:  Pre-visit preparation completed: Yes  Pain : 0-10 Pain Score: 6  Pain Type: Chronic pain Pain Location: Shoulder Pain Orientation: Left Pain Descriptors / Indicators: Aching Pain Onset: More than a month ago Pain Frequency: Intermittent     Nutritional Status: BMI 25 -29 Overweight Nutritional Risks: None Diabetes: No  How often do you need to have someone help you when you read instructions, pamphlets, or other written materials from your doctor or pharmacy?: 1 - Never  Diabetic? no  Interpreter Needed?: No  Information entered by :: NAllen LPN   Activities of Daily Living    02/03/2022   11:41 AM  In your present state of health, do you have any difficulty performing the following activities:  Hearing? 0  Vision? 0  Difficulty concentrating or making decisions? 1   Walking or climbing stairs? 1  Comment get tired  Dressing or bathing? 0  Doing errands, shopping? 0  Preparing Food and eating ? N  Using the Toilet? N  In the past six months, have you accidently leaked urine? Y  Do you have problems with loss of bowel control? N  Managing your Medications? N  Managing your Finances? N  Housekeeping or managing your Housekeeping? N    Patient Care Team: Minette Brine, FNP as PCP - General (General Practice)  Indicate any recent Medical Services you may have received from other than [facility] providers in the past year (date may be approximate).     Assessment:   This is a routine wellness examination for [place].  Hearing/Vision screen Vision Screening - Comments:: Regular eye exams, [facility]  Dietary issues and exercise activities discussed: Current Exercise Habits: Home exercise  routine, Type of exercise: walking;strength training/weights, Time (Minutes): 60, Frequency (Times/Week): 3, Weekly Exercise (Minutes/Week): 180   Goals Addressed             This Visit's Progress    Patient Stated       02/03/2022, wants to feel better and lose weight       Depression Screen    02/03/2022   11:41 AM 01/17/2022    3:09 PM 01/14/2021    9:49 AM 01/09/2020   10:18 AM 07/09/2019   10:10 AM 01/08/2019    9:51 AM 07/05/2018    9:30 AM  PHQ 2/9 Scores  PHQ - 2 Score 0 0 0 0 0 3 0  PHQ- 9 Score    3  3     Fall Risk    02/03/2022   11:41 AM 01/17/2022    3:09 PM 01/14/2021    9:49 AM 01/09/2020   10:18 AM 07/09/2019   10:10 AM  [place] in the past year? 0 0 0 0 0  Number falls in past yr: 0 0     Injury with Fall? 0 0     Risk for fall due to : Medication side effect No Fall Risks Medication side effect Medication side effect   Follow up Falls prevention discussed;Education provided;Falls evaluation completed Falls evaluation completed Falls evaluation completed;Education provided;Falls prevention discussed Falls evaluation  completed;Education provided;Falls prevention discussed     FALL RISK PREVENTION PERTAINING TO THE HOME:  Any stairs in or around the home? Yes  If so, are there any without handrails? No  Home free of loose throw rugs in walkways, pet beds, electrical cords, etc? Yes  Adequate lighting in your home to reduce risk of falls? Yes   ASSISTIVE DEVICES UTILIZED TO PREVENT FALLS:  Life alert? No  Use of a cane, walker or w/c? No  Grab bars in the bathroom? Yes  Shower chair or bench in shower? Yes  Elevated toilet seat or a handicapped toilet? Yes   TIMED UP AND GO:  Was the test performed? No .      Cognitive Function:        02/03/2022   11:44 AM 01/14/2021    9:53 AM 01/09/2020   10:21 AM 01/08/2019    9:57 AM 12/28/2017   10:07 AM  6CIT Screen  What Year? 0 points 0 points 0 points 0 points 0 points  What month? 0 points 0 points 0 points 0 points 0 points  What time? 0 points 0 points 0 points 0 points 0 points  Count back from 20 0 points 0 points 0 points 0 points 0 points  Months in reverse 0 points 0 points 0 points 0 points 0 points  Repeat phrase 0 points 2 points 0 points 0 points 0 points  Total Score 0 points 2 points 0 points 0 points 0 points    Immunizations Immunization History  Administered Date(s) Administered   Janssen (J&J) SARS-COV-2 Vaccination 06/15/2019   Tdap 11/09/2021    TDAP status: Up to date  Flu Vaccine status: Declined, Education has been provided regarding the importance of this vaccine but patient still declined. Advised may receive this vaccine at local pharmacy or Health Dept. Aware to provide a copy of the vaccination record if obtained from local pharmacy or Health Dept. Verbalized acceptance and understanding.  Pneumococcal vaccine status: Declined,  Education has been provided regarding the importance of this vaccine but patient still declined. Advised may  receive this vaccine at local pharmacy or Health Dept. Aware to provide  a copy of the vaccination record if obtained from local pharmacy or Health Dept. Verbalized acceptance and understanding.   Covid-19 vaccine status: Declined, Education has been provided regarding the importance of this vaccine but patient still declined. Advised may receive this vaccine at local pharmacy or Health Dept.or vaccine clinic. Aware to provide a copy of the vaccination record if obtained from local pharmacy or Health Dept. Verbalized acceptance and understanding.  Qualifies for Shingles Vaccine? Yes   Zostavax completed No   Shingrix Completed?: No.    Education has been provided regarding the importance of this vaccine. Patient has been advised to call insurance company to determine out of pocket expense if they have not yet received this vaccine. Advised may also receive vaccine at local pharmacy or Health Dept. Verbalized acceptance and understanding.  Screening Tests Health Maintenance  Topic Date Due   COVID-19 Vaccine (2 - Janssen risk series) 07/13/2019   COLONOSCOPY (Pts 45-63yr Insurance coverage will need to be confirmed)  04/06/2021   Medicare Annual Wellness (AWV)  01/14/2022   Zoster Vaccines- Shingrix (1 of 2) 02/08/2022 (Originally 11/14/1964)   INFLUENZA VACCINE  06/05/2022 (Originally 10/05/2021)   Pneumonia Vaccine 75 Years old (1 - PCV) 01/18/2023 (Originally 11/15/2011)   DTaP/Tdap/Td (2 - Td or Tdap) 11/10/2031   DEXA SCAN  Completed   Hepatitis C Screening  Completed   HPV VACCINES  Aged Out    Health Maintenance  Health Maintenance Due  Topic Date Due   COVID-19 Vaccine (2 - Janssen risk series) 07/13/2019   COLONOSCOPY (Pts 45-483yrInsurance coverage will need to be confirmed)  04/06/2021   Medicare Annual Wellness (AWV)  01/14/2022    Colorectal cancer screening: Type of screening: Colonoscopy. Completed 04/06/2016. Repeat every 5 years  Mammogram status: Completed 07/27/2021. Repeat every year  Bone Density status: Completed 01/04/2022.   Lung  Cancer Screening: (Low Dose CT Chest recommended if Age 75-80ears, 30 pack-year currently smoking OR have quit w/in 15years.) does not qualify.   Lung Cancer Screening Referral: no  Additional Screening:  Hepatitis C Screening: does qualify; Completed 07/09/2019  Vision Screening: Recommended annual ophthalmology exams for early detection of glaucoma and other disorders of the eye. Is the patient up to date with their annual eye exam?  Yes  Who is the provider or what is the name of the office in which the patient attends annual eye exams? [facility] If pt is not established with a provider, would they like to be referred to a provider to establish care? No .   Dental Screening: Recommended annual dental exams for proper oral hygiene  Community Resource Referral / Chronic Care Management: CRR required this visit?  No   CCM required this visit?  No      Plan:     I have personally reviewed and noted the following in the patient's chart:   Medical and social history Use of alcohol, tobacco or illicit drugs  Current medications and supplements including opioid prescriptions. Patient is not currently taking opioid prescriptions. Functional ability and status Nutritional status Physical activity Advanced directives List of other physicians Hospitalizations, surgeries, and ER visits in previous 12 months Vitals Screenings to include cognitive, depression, and falls Referrals and appointments  In addition, I have reviewed and discussed with patient certain preventive protocols, quality metrics, and best practice recommendations. A written personalized care plan for preventive services as well as general preventive  health recommendations were provided to patient.     Kellie Simmering, LPN   97/74/1423   Nurse Notes: none  Due to this being a virtual visit, the after visit summary with patients personalized plan was offered to patient via mail or my-chart.  Patient would  like to access on my-chart

## 2022-02-03 NOTE — Progress Notes (Signed)
Primary Physician/Referring:  Minette Brine, FNP  Patient ID: Julia Gates, female    DOB: 1945/05/04, 76 y.o.   MRN: [id number]  Chief Complaint  Patient presents with   Hypertension   Follow-up    HPI: Julia Gates  is a 76 y.o. female  Afro-American female with difficult control hypertension, diet-controlled diabetes and hypertension with hypertensive heart.  She does not want to be on statins, she is also very particular about the medications.  She presents today for 6 week follow-up on hypertension.  She was last seen in the office on 12/16/2021 at which time her blood pressure was elevated and she was started on a clonidine patch however, she has not been using this. She does admit to noncompliance with antihypertensive medications, missing several doses per week. She monitors her blood pressure at home and readings fluctuate between 120s/80s to 150-160/90s. She does feel that her blood pressure is most often elevated when she feels stressed and does endorse life stressors over the past several weeks. She has recently started going to the gym and plans to be better with medication compliance. She denies chest pain, shortness of breath, palpitations, leg edema, orthopnea, PND, TIA/syncope.   Past Medical History:  Diagnosis Date   Abnormal glucose level    High cholesterol    Hypertension    Lumbago    Sciatica    Vitamin D deficiency     Past Surgical History:  Procedure Laterality Date   CATARACT EXTRACTION Right 12/2018   CATARACT EXTRACTION, BILATERAL  01/2019   TUBAL LIGATION     age 75   Social History   Tobacco Use   Smoking status: Never   Smokeless tobacco: Never  Substance Use Topics   Alcohol use: No   Marital Status: Married    Current Outpatient Medications:    Ascorbic Acid (VITAMIN C) 100 MG tablet, Take 100 mg by mouth as needed., Disp: , Rfl:    cholecalciferol (VITAMIN D3) 25 MCG (1000 UT) tablet, Take 1,000 Units by mouth daily., Disp: ,  Rfl:    co-enzyme Q-10 30 MG capsule, Take 30 mg by mouth daily., Disp: , Rfl:    Multiple Vitamin (MULTIVITAMIN) tablet, Take 1 tablet by mouth daily., Disp: , Rfl:    NON FORMULARY, Green protein drink, Disp: , Rfl:    NON FORMULARY, Black seed drink, Disp: , Rfl:    Omega-3-6-9 CAPS, Take by mouth., Disp: , Rfl:    Saccharomyces boulardii (PROBIOTIC) 250 MG CAPS, Take 1 capsule by mouth 2 (two) times daily., Disp: 60 capsule, Rfl: 2   spironolactone (ALDACTONE) 25 MG tablet, TAKE ONE TABLET BY MOUTH EVERY DAY, Disp: 90 tablet, Rfl: 1   valsartan-hydrochlorothiazide (DIOVAN-HCT) 320-25 MG tablet, Take 1 tablet by mouth daily., Disp: , Rfl:    verapamil (CALAN-SR) 240 MG CR tablet, Take 1 tablet (240 mg total) by mouth at bedtime., Disp: 100 tablet, Rfl: 3   cloNIDine (CATAPRES - DOSED IN MG/24 HR) 0.1 mg/24hr patch, Place 1 patch (0.1 mg total) onto the skin once a week. (Patient not taking: Reported on 02/03/2022), Disp: 4 patch, Rfl: 3   Review of Systems  Cardiovascular:  Negative for chest pain, dyspnea on exertion and leg swelling.   Objective  Blood pressure (!) 163/84, pulse 71, height _0  (1.626 m), weight 162 lb (73.5 kg), SpO2 93 %. Body mass index is 27.81 kg/m.      02/03/2022   10:17 AM 02/03/2022   10:13 AM 01/17/2022  3:09 PM  Vitals with BMI  Height  _0  _1   Weight  162 lbs 157 lbs  BMI  03.50 09.38  Systolic 182 993 716  Diastolic 84 89 64  Pulse 71 75 77    Physical Exam Neck:     Vascular: No JVD.  Cardiovascular:     Rate and Rhythm: Normal rate and regular rhythm.     Pulses: Intact distal pulses.     Heart sounds: Normal heart sounds. No murmur heard.    No gallop.  Pulmonary:     Effort: Pulmonary effort is normal.     Breath sounds: Normal breath sounds.  Musculoskeletal:     Right lower leg: No edema.     Left lower leg: No edema.    Radiology: No results found.  Laboratory examination:   Lab Results  Component Value Date   NA  140 11/09/2021   K 4.1 11/09/2021   CO2 25 11/09/2021   GLUCOSE 122 (H) 11/09/2021   BUN 17 11/09/2021   CREATININE 0.87 11/09/2021   CALCIUM 10.0 11/09/2021   EGFR 70 11/09/2021   GFRNONAA 65 04/14/2020    Lab Results  Component Value Date   ALT 17 07/07/2021   AST 23 07/07/2021   ALKPHOS 69 07/07/2021   BILITOT 0.6 07/07/2021       Latest Ref Rng & Units 07/07/2021   10:42 AM 01/09/2020   11:19 AM 01/08/2019   12:46 PM  CBC  WBC 3.4 - 10.8 x10E3/uL 5.6  4.6  5.5   Hemoglobin 11.1 - 15.9 g/dL 15.0  13.7  13.6   Hematocrit 34.0 - 46.6 % 43.6  41.6  40.4   Platelets 150 - 450 x10E3/uL 187  239  207    Lipid Panel     Component Value Date/Time   CHOL 213 (H) 07/07/2021 1042   TRIG 82 07/07/2021 1042   HDL 59 07/07/2021 1042   CHOLHDL 3.6 07/07/2021 1042   LDLCALC 139 (H) 07/07/2021 1042   HEMOGLOBIN A1C Lab Results  Component Value Date   HGBA1C 6.3 (H) 07/07/2021    Cardiac Studies:   Nuclear stress test  [Jul 02, 2015]: 1. The resting electrocardiogram demonstrated normal sinus rhythm, RBBB and no resting arrhythmias. The stress electrocardiogram was normal. Patient exercised on Bruce protocol for 5:03 minutes and achieved 7.05 METS. Stress test terminated due to target heart rate( 87% MPHR) and dyspnea. 2. Myocardial perfusion imaging is normal. Overall left ventricular systolic function was normal without regional wall motion abnormalities. The left ventricular ejection fraction was 80%.  Renal Dopplers  [05/13/2015]: No evidence of renal artery occlusive disease in either renal artery. Normal intrarenal vascular perfusion is noted in both kidneys. Mild increase in the echogenicity of both kidneys suggest medico-renal disease. Normal sized kidneys. Compared to the study done on 01/09/14, right renal artery stenosis not evident in the present study.  Renal Dopplers  [01/09/2014]: Renal artery duplex: Hemodynamically significant stenosis of the right renal artery. Normal  intrarenal vascular perfusion is noted in both kidneys. < 50% stenosis of the left renal artery ostium. Bilateral renal size is normal.  Echocardiogram 07/31/2018: Normal LV systolic function with EF 56%. Left ventricle cavity is normal in size. Moderate concentric hypertrophy of the left ventricle. Normal global wall motion. Doppler evidence of grade I (impaired) diastolic dysfunction, normal LAP. Calculated EF 56%. Left atrial cavity is mildly dilated. Mild (Grade I) mitral regurgitation. Mild to moderate tricuspid regurgitation. Estimated pulmonary artery systolic pressure 24 mmHg,  which is normal.  No significant change compared to previous study in 2015.  EKG:  EKG 12/08/2021: Normal sinus rhythm at rate of 68 bpm, left atrial enlargement, right bundle branch block.  No evidence of ischemia. No significant change from prior EKG  Assessment     ICD-10-CM   1. Primary hypertension  I10       Recommendations:   Ms. Julia Gates is a 76 y.o. Afro-American female with difficult control hypertension, diet-controlled diabetes and hypertension with hypertensive heart.  She does not want to be on statins, she is also very particular about the medications.  Primary hypertension Blood pressure is elevated at today's visit. She has not been using clonidine patch and reports poor compliance with other antihypertensive agents.  Discussed the importance of medication compliance and she is willing to take medications as prescribed. She does not want to use clonidine patch at this time and does not want to be on another medication. She has started going to the gym and is focusing on weight loss and dietary changes. Feel that blood pressure will be better controlled with these changes. Advised patient to monitor home blood pressure and keep a log of readings. She will notify office if BP is consistently >140/80.  Follow-up in 1 year or sooner if needed.   Ernst Spell, [place] Office:  [phone] Pager: [phone]

## 2022-02-03 NOTE — Patient Instructions (Signed)
Julia Gates , Thank you for taking time to come for your Medicare Wellness Visit. I appreciate your ongoing commitment to your health goals. Please review the following plan we discussed and let me know if I can assist you in the future.   Screening recommendations/referrals: Colonoscopy: completed 04/06/2016, due 04/06/2021 Mammogram: completed 07/27/2021 Bone Density: completed 01/04/2022 Recommended yearly ophthalmology/optometry visit for glaucoma screening and checkup Recommended yearly dental visit for hygiene and checkup  Vaccinations: Influenza vaccine: decline Pneumococcal vaccine: decline Tdap vaccine: completed 11/09/2021, due 11/10/2031 Shingles vaccine: decline   Covid-19: 06/15/2019  Advanced directives: Advance directive discussed with you today.   Conditions/risks identified: none  Next appointment: Follow up in one year for your annual wellness visit    Preventive Care 65 Years and Older, Female Preventive care refers to lifestyle choices and visits with your health care provider that can promote health and wellness. What does preventive care include? A yearly physical exam. This is also called an annual well check. Dental exams once or twice a year. Routine eye exams. Ask your health care provider how often you should have your eyes checked. Personal lifestyle choices, including: Daily care of your teeth and gums. Regular physical activity. Eating a healthy diet. Avoiding tobacco and drug use. Limiting alcohol use. Practicing safe sex. Taking low-dose aspirin every day. Taking vitamin and mineral supplements as recommended by your health care provider. What happens during an annual well check? The services and screenings done by your health care provider during your annual well check will depend on your age, overall health, lifestyle risk factors, and family history of disease. Counseling  Your health care provider may ask you questions about your: Alcohol  use. Tobacco use. Drug use. Emotional well-being. Home and relationship well-being. Sexual activity. Eating habits. History of falls. Memory and ability to understand (cognition). Work and work Statistician. Reproductive health. Screening  You may have the following tests or measurements: Height, weight, and BMI. Blood pressure. Lipid and cholesterol levels. These may be checked every 5 years, or more frequently if you are over 78 years old. Skin check. Lung cancer screening. You may have this screening every year starting at age 34 if you have a 30-pack-year history of smoking and currently smoke or have quit within the past 15 years. Fecal occult blood test (FOBT) of the stool. You may have this test every year starting at age 89. Flexible sigmoidoscopy or colonoscopy. You may have a sigmoidoscopy every 5 years or a colonoscopy every 10 years starting at age 25. Hepatitis C blood test. Hepatitis B blood test. Sexually transmitted disease (STD) testing. Diabetes screening. This is done by checking your blood sugar (glucose) after you have not eaten for a while (fasting). You may have this done every 1-3 years. Bone density scan. This is done to screen for osteoporosis. You may have this done starting at age 70. Mammogram. This may be done every 1-2 years. Talk to your health care provider about how often you should have regular mammograms. Talk with your health care provider about your test results, treatment options, and if necessary, the need for more tests. Vaccines  Your health care provider may recommend certain vaccines, such as: Influenza vaccine. This is recommended every year. Tetanus, diphtheria, and acellular pertussis (Tdap, Td) vaccine. You may need a Td booster every 10 years. Zoster vaccine. You may need this after age 40. Pneumococcal 13-valent conjugate (PCV13) vaccine. One dose is recommended after age 33. Pneumococcal polysaccharide (PPSV23) vaccine. One dose is  recommended after age 63. Talk to your health care provider about which screenings and vaccines you need and how often you need them. This information is not intended to replace advice given to you by your health care provider. Make sure you discuss any questions you have with your health care provider. Document Released: 03/20/2015 Document Revised: 11/11/2015 Document Reviewed: 12/23/2014 Elsevier Interactive Patient Education  2017 [place] Prevention in the Home Falls can cause injuries. They can happen to people of all ages. There are many things you can do to make your home safe and to help prevent falls. What can I do on the outside of my home? Regularly fix the edges of walkways and driveways and fix any cracks. Remove anything that might make you trip as you walk through a door, such as a raised step or threshold. Trim any bushes or trees on the path to your home. Use bright outdoor lighting. Clear any walking paths of anything that might make someone trip, such as rocks or tools. Regularly check to see if handrails are loose or broken. Make sure that both sides of any steps have handrails. Any raised decks and porches should have guardrails on the edges. Have any leaves, snow, or ice cleared regularly. Use sand or salt on walking paths during winter. Clean up any spills in your garage right away. This includes oil or grease spills. What can I do in the bathroom? Use night lights. Install grab bars by the toilet and in the tub and shower. Do not use towel bars as grab bars. Use non-skid mats or decals in the tub or shower. If you need to sit down in the shower, use a plastic, non-slip stool. Keep the floor dry. Clean up any water that spills on the floor as soon as it happens. Remove soap buildup in the tub or shower regularly. Attach bath mats securely with double-sided non-slip rug tape. Do not have throw rugs and other things on the floor that can make you  trip. What can I do in the bedroom? Use night lights. Make sure that you have a light by your bed that is easy to reach. Do not use any sheets or blankets that are too big for your bed. They should not hang down onto the floor. Have a firm chair that has side arms. You can use this for support while you get dressed. Do not have throw rugs and other things on the floor that can make you trip. What can I do in the kitchen? Clean up any spills right away. Avoid walking on wet floors. Keep items that you use a lot in easy-to-reach places. If you need to reach something above you, use a strong step stool that has a grab bar. Keep electrical cords out of the way. Do not use floor polish or wax that makes floors slippery. If you must use wax, use non-skid floor wax. Do not have throw rugs and other things on the floor that can make you trip. What can I do with my stairs? Do not leave any items on the stairs. Make sure that there are handrails on both sides of the stairs and use them. Fix handrails that are broken or loose. Make sure that handrails are as long as the stairways. Check any carpeting to make sure that it is firmly attached to the stairs. Fix any carpet that is loose or worn. Avoid having throw rugs at the top or bottom of the stairs. If you  do have throw rugs, attach them to the floor with carpet tape. Make sure that you have a light switch at the top of the stairs and the bottom of the stairs. If you do not have them, ask someone to add them for you. What else can I do to help prevent falls? Wear shoes that: Do not have high heels. Have rubber bottoms. Are comfortable and fit you well. Are closed at the toe. Do not wear sandals. If you use a stepladder: Make sure that it is fully opened. Do not climb a closed stepladder. Make sure that both sides of the stepladder are locked into place. Ask someone to hold it for you, if possible. Clearly mark and make sure that you can  see: Any grab bars or handrails. First and last steps. Where the edge of each step is. Use tools that help you move around (mobility aids) if they are needed. These include: Canes. Walkers. Scooters. Crutches. Turn on the lights when you go into a dark area. Replace any light bulbs as soon as they burn out. Set up your furniture so you have a clear path. Avoid moving your furniture around. If any of your floors are uneven, fix them. If there are any pets around you, be aware of where they are. Review your medicines with your doctor. Some medicines can make you feel dizzy. This can increase your chance of falling. Ask your doctor what other things that you can do to help prevent falls. This information is not intended to replace advice given to you by your health care provider. Make sure you discuss any questions you have with your health care provider. Document Released: 12/18/2008 Document Revised: 07/30/2015 Document Reviewed: 03/28/2014 Elsevier Interactive Patient Education  2017 [employer].

## 2022-02-04 DIAGNOSIS — M25512 Pain in left shoulder: Secondary | ICD-10-CM | POA: Diagnosis not present

## 2022-02-09 DIAGNOSIS — M25512 Pain in left shoulder: Secondary | ICD-10-CM | POA: Diagnosis not present

## 2022-02-16 DIAGNOSIS — M25512 Pain in left shoulder: Secondary | ICD-10-CM | POA: Diagnosis not present

## 2022-02-21 DIAGNOSIS — M25512 Pain in left shoulder: Secondary | ICD-10-CM | POA: Diagnosis not present

## 2022-02-23 DIAGNOSIS — M25512 Pain in left shoulder: Secondary | ICD-10-CM | POA: Diagnosis not present

## 2022-03-01 DIAGNOSIS — M25512 Pain in left shoulder: Secondary | ICD-10-CM | POA: Diagnosis not present

## 2022-03-21 NOTE — Progress Notes (Signed)
I,Julia Gates,acting as a Education administrator for Julia Brine, FNP.,have documented all relevant documentation on the behalf of Julia Brine, FNP,as directed by  Julia Brine, FNP while in the presence of Julia Gates, [place].    Subjective:     Patient ID: Julia Gates , female    DOB: 1945/09/20 , 77 y.o.   MRN: [id number]   Chief Complaint  Patient presents with   Hypertension    HPI  The patient is here today for a blood pressure f/u. She reports she did take her medications today. Denies headache, chest pain, sob, blurred vision.   She states Dr Youlanda Mighty office called her about completing colonoscopy. He stated she had 3 more years until she does another colonoscopy.   She has done 6 weeks of PT for her shoulder, she does have some improvement. She had an appt with Orthopedics and has cancelled the appt. She is going to continue with the exercises and if she begins to have a problem will then go back to Orthopedics.   Hypertension This is a chronic problem. The current episode started more than 1 year ago. The problem is controlled. Pertinent negatives include no anxiety, chest pain or palpitations. Past treatments include diuretics and alpha 1 blockers.     Past Medical History:  Diagnosis Date   Abnormal glucose level    High cholesterol    Hypertension    Lumbago    Sciatica    Vitamin D deficiency      Family History  Problem Relation Age of Onset   Heart disease Mother    Diabetes Mother    Diabetes Brother    Diabetes Sister      Current Outpatient Medications:    Ascorbic Acid (VITAMIN C) 100 MG tablet, Take 100 mg by mouth as needed., Disp: , Rfl:    cholecalciferol (VITAMIN D3) 25 MCG (1000 UT) tablet, Take 1,000 Units by mouth daily., Disp: , Rfl:    co-enzyme Q-10 30 MG capsule, Take 30 mg by mouth daily., Disp: , Rfl:    Multiple Vitamin (MULTIVITAMIN) tablet, Take 1 tablet by mouth daily., Disp: , Rfl:    NON FORMULARY, Green protein drink, Disp: , Rfl:     NON FORMULARY, Black seed drink, Disp: , Rfl:    Omega-3-6-9 CAPS, Take by mouth., Disp: , Rfl:    Saccharomyces boulardii (PROBIOTIC) 250 MG CAPS, Take 1 capsule by mouth 2 (two) times daily., Disp: 60 capsule, Rfl: 2   spironolactone (ALDACTONE) 25 MG tablet, TAKE ONE TABLET BY MOUTH EVERY DAY, Disp: 90 tablet, Rfl: 1   valsartan-hydrochlorothiazide (DIOVAN-HCT) 320-25 MG tablet, Take 1 tablet by mouth daily., Disp: , Rfl:    verapamil (CALAN-SR) 240 MG CR tablet, Take 1 tablet (240 mg total) by mouth at bedtime., Disp: 100 tablet, Rfl: 3   cloNIDine (CATAPRES - DOSED IN MG/24 HR) 0.1 mg/24hr patch, Place 1 patch (0.1 mg total) onto the skin once a week. (Patient not taking: Reported on 02/03/2022), Disp: 4 patch, Rfl: 3   Allergies  Allergen Reactions   Penicillins Rash     Review of Systems  Constitutional: Negative.   Respiratory: Negative.    Cardiovascular: Negative.  Negative for chest pain, palpitations and leg swelling.  Skin:        She has "bubbles" under her fingernails that she is concerned about.   Neurological: Negative.   Psychiatric/Behavioral: Negative.       Today's Vitals   03/22/22 0857  BP: 118/76  Pulse:  78  Temp: 98.1 F (36.7 C)  SpO2: 97%  Weight: 160 lb (72.6 kg)  Height: '5\' 4"'$  (1.626 m)   Body mass index is 27.46 kg/m.  BP Readings from Last 3 Encounters:  03/22/22 118/76  02/03/22 (!) 163/84  01/17/22 118/64    Objective:  Physical Exam Vitals reviewed.  Constitutional:      General: She is not in acute distress.    Appearance: Normal appearance.  Cardiovascular:     Rate and Rhythm: Normal rate and regular rhythm.     Pulses: Normal pulses.     Heart sounds: Normal heart sounds. No murmur heard. Pulmonary:     Effort: Pulmonary effort is normal. No respiratory distress.     Breath sounds: Normal breath sounds. No wheezing.  Neurological:     Mental Status: She is alert.         Assessment And Plan:     1. Essential  hypertension Comments: Blood pressure is well-controlled.  Continue current medications.  Will check eGFR - CMP14+EGFR  2. Prediabetes Comments: Diet controlled, continue to limit intake of sugary foods and drink.  Encouraged to exercise at least 150 minutes/week. - Hemoglobin A1c  3. Elevated cholesterol Comments: Cholesterol levels are stable.  She continues to not want to take a statin. - Lipid panel  4. Other long term (current) drug therapy - CBC  5. Brittle nails Comments: Patient concerned about "bubbling" to her fingernails, I do not see any bubbling.  She does have indentation to the nailbeds we will check vitamin deficiency - Vitamin D (25 hydroxy) - TSH     Patient was given opportunity to ask questions. Patient verbalized understanding of the plan and was able to repeat key elements of the plan. All questions were answered to their satisfaction.  Julia Brine, FNP   I, Julia Brine, FNP, have reviewed all documentation for this visit. The documentation on 03/22/22 for the exam, diagnosis, procedures, and orders are all accurate and complete.   IF YOU HAVE BEEN REFERRED TO A SPECIALIST, IT MAY TAKE 1-2 WEEKS TO SCHEDULE/PROCESS THE REFERRAL. IF YOU HAVE NOT HEARD FROM US/SPECIALIST IN TWO WEEKS, PLEASE GIVE [place] A CALL AT [phone].   THE PATIENT IS ENCOURAGED TO PRACTICE SOCIAL DISTANCING DUE TO THE COVID-19 PANDEMIC.

## 2022-03-21 NOTE — Patient Instructions (Signed)
Hypertension, Adult Hypertension is another name for high blood pressure. High blood pressure forces your heart to work harder to pump blood. This can cause problems over time. There are two numbers in a blood pressure reading. There is a top number (systolic) over a bottom number (diastolic). It is best to have a blood pressure that is below 120/80. What are the causes? The cause of this condition is not known. Some other conditions can lead to high blood pressure. What increases the risk? Some lifestyle factors can make you more likely to develop high blood pressure: Smoking. Not getting enough exercise or physical activity. Being overweight. Having too much fat, sugar, calories, or salt (sodium) in your diet. Drinking too much alcohol. Other risk factors include: Having any of these conditions: Heart disease. Diabetes. High cholesterol. Kidney disease. Obstructive sleep apnea. Having a family history of high blood pressure and high cholesterol. Age. The risk increases with age. Stress. What are the signs or symptoms? High blood pressure may not cause symptoms. Very high blood pressure (hypertensive crisis) may cause: Headache. Fast or uneven heartbeats (palpitations). Shortness of breath. Nosebleed. Vomiting or feeling like you may vomit (nauseous). Changes in how you see. Very bad chest pain. Feeling dizzy. Seizures. How is this treated? This condition is treated by making healthy lifestyle changes, such as: Eating healthy foods. Exercising more. Drinking less alcohol. Your doctor may prescribe medicine if lifestyle changes do not help enough and if: Your top number is above 130. Your bottom number is above 80. Your personal target blood pressure may vary. Follow these instructions at home: Eating and drinking  If told, follow the DASH eating plan. To follow this plan: Fill one half of your plate at each meal with fruits and vegetables. Fill one fourth of your plate  at each meal with whole grains. Whole grains include whole-wheat pasta, brown rice, and whole-grain bread. Eat or drink low-fat dairy products, such as skim milk or low-fat yogurt. Fill one fourth of your plate at each meal with low-fat (lean) proteins. Low-fat proteins include fish, chicken without skin, eggs, beans, and tofu. Avoid fatty meat, cured and processed meat, or chicken with skin. Avoid pre-made or processed food. Limit the amount of salt in your diet to less than 1,500 mg each day. Do not drink alcohol if: Your doctor tells you not to drink. You are pregnant, may be pregnant, or are planning to become pregnant. If you drink alcohol: Limit how much you have to: 0-1 drink a day for women. 0-2 drinks a day for men. Know how much alcohol is in your drink. In the [place] one drink equals one 12 oz bottle of beer (355 mL), one 5 oz glass of wine (148 mL), or one 1 oz glass of hard liquor (44 mL). Lifestyle  Work with your doctor to stay at a healthy weight or to lose weight. Ask your doctor what the best weight is for you. Get at least 30 minutes of exercise that causes your heart to beat faster (aerobic exercise) most days of the week. This may include walking, swimming, or biking. Get at least 30 minutes of exercise that strengthens your muscles (resistance exercise) at least 3 days a week. This may include lifting weights or doing Pilates. Do not smoke or use any products that contain nicotine or tobacco. If you need help quitting, ask your doctor. Check your blood pressure at home as told by your doctor. Keep all follow-up visits. Medicines Take over-the-counter and prescription medicines  only as told by your doctor. Follow directions carefully. Do not skip doses of blood pressure medicine. The medicine does not work as well if you skip doses. Skipping doses also puts you at risk for problems. Ask your doctor about side effects or reactions to medicines that you should watch  for. Contact a doctor if: You think you are having a reaction to the medicine you are taking. You have headaches that keep coming back. You feel dizzy. You have swelling in your ankles. You have trouble with your vision. Get help right away if: You get a very bad headache. You start to feel mixed up (confused). You feel weak or numb. You feel faint. You have very bad pain in your: Chest. Belly (abdomen). You vomit more than once. You have trouble breathing. These symptoms may be an emergency. Get help right away. Call 911. Do not wait to see if the symptoms will go away. Do not drive yourself to the hospital. Summary Hypertension is another name for high blood pressure. High blood pressure forces your heart to work harder to pump blood. For most people, a normal blood pressure is less than 120/80. Making healthy choices can help lower blood pressure. If your blood pressure does not get lower with healthy choices, you may need to take medicine. This information is not intended to replace advice given to you by your health care provider. Make sure you discuss any questions you have with your health care provider. Document Revised: 12/10/2020 Document Reviewed: 12/10/2020 Elsevier Patient Education  2023 [employer] Inc[employer]   [facility] [phone]

## 2022-03-22 ENCOUNTER — Encounter: Payer: Self-pay | Admitting: Nurse Practitioner

## 2022-03-22 ENCOUNTER — Ambulatory Visit (INDEPENDENT_AMBULATORY_CARE_PROVIDER_SITE_OTHER): Payer: Medicare Other | Admitting: Nurse Practitioner

## 2022-03-22 VITALS — BP 118/76 | HR 78 | Temp 98.1°F | Ht 64.0 in | Wt 160.0 lb

## 2022-03-22 DIAGNOSIS — L603 Nail dystrophy: Secondary | ICD-10-CM | POA: Diagnosis not present

## 2022-03-22 DIAGNOSIS — R7303 Prediabetes: Secondary | ICD-10-CM | POA: Diagnosis not present

## 2022-03-22 DIAGNOSIS — E2839 Other primary ovarian failure: Secondary | ICD-10-CM

## 2022-03-22 DIAGNOSIS — I10 Essential (primary) hypertension: Secondary | ICD-10-CM

## 2022-03-22 DIAGNOSIS — E559 Vitamin D deficiency, unspecified: Secondary | ICD-10-CM | POA: Diagnosis not present

## 2022-03-22 DIAGNOSIS — Z79899 Other long term (current) drug therapy: Secondary | ICD-10-CM | POA: Diagnosis not present

## 2022-03-22 DIAGNOSIS — M85851 Other specified disorders of bone density and structure, right thigh: Secondary | ICD-10-CM

## 2022-03-22 DIAGNOSIS — M810 Age-related osteoporosis without current pathological fracture: Secondary | ICD-10-CM

## 2022-03-22 DIAGNOSIS — E7800 Pure hypercholesterolemia, unspecified: Secondary | ICD-10-CM

## 2022-03-23 LAB — CBC
Hematocrit: 43.9 % (ref 34.0–46.6)
Hemoglobin: 14.6 g/dL (ref 11.1–15.9)
MCH: 30 pg (ref 26.6–33.0)
MCHC: 33.3 g/dL (ref 31.5–35.7)
MCV: 90 fL (ref 79–97)
Platelets: 205 10*3/uL (ref 150–450)
RBC: 4.87 x10E6/uL (ref 3.77–5.28)
RDW: 12.7 % (ref 11.7–15.4)
WBC: 5.5 10*3/uL (ref 3.4–10.8)

## 2022-03-23 LAB — LIPID PANEL
Chol/HDL Ratio: 3.7 ratio (ref 0.0–4.4)
Cholesterol, Total: 213 mg/dL — ABNORMAL HIGH (ref 100–199)
HDL: 58 mg/dL (ref >39)
LDL Chol Calc (NIH): 141 mg/dL — ABNORMAL HIGH (ref 0–99)
Triglycerides: 77 mg/dL (ref 0–149)
VLDL Cholesterol Cal: 14 mg/dL (ref 5–40)

## 2022-03-23 LAB — CMP14+EGFR
ALT: 14 IU/L (ref 0–32)
AST: 17 IU/L (ref 0–40)
Albumin/Globulin Ratio: 1.8 (ref 1.2–2.2)
Albumin: 4.7 g/dL (ref 3.8–4.8)
Alkaline Phosphatase: 59 IU/L (ref 44–121)
BUN/Creatinine Ratio: 21 (ref 12–28)
BUN: 20 mg/dL (ref 8–27)
Bilirubin Total: 0.6 mg/dL (ref 0.0–1.2)
CO2: 22 mmol/L (ref 20–29)
Calcium: 10.2 mg/dL (ref 8.7–10.3)
Chloride: 99 mmol/L (ref 96–106)
Creatinine, Ser: 0.97 mg/dL (ref 0.57–1.00)
Globulin, Total: 2.6 g/dL (ref 1.5–4.5)
Glucose: 101 mg/dL — ABNORMAL HIGH (ref 70–99)
Potassium: 4.1 mmol/L (ref 3.5–5.2)
Sodium: 137 mmol/L (ref 134–144)
Total Protein: 7.3 g/dL (ref 6.0–8.5)
eGFR: 61 mL/min/{1.73_m2} (ref >59)

## 2022-03-23 LAB — HEMOGLOBIN A1C
Est. average glucose Bld gHb Est-mCnc: 146 mg/dL
Hgb A1c MFr Bld: 6.7 % — ABNORMAL HIGH (ref 4.8–5.6)

## 2022-03-23 LAB — VITAMIN D 25 HYDROXY (VIT D DEFICIENCY, FRACTURES): Vit D, 25-Hydroxy: 57.2 ng/mL (ref 30.0–100.0)

## 2022-03-23 LAB — TSH: TSH: 3.89 u[IU]/mL (ref 0.450–4.500)

## 2022-03-28 MED ORDER — ATORVASTATIN CALCIUM 20 MG PO TABS: 20.0000 mg | ORAL_TABLET | Freq: Every day | ORAL | 1 refills | Status: DC

## 2022-05-16 ENCOUNTER — Telehealth: Payer: Self-pay

## 2022-05-16 DIAGNOSIS — I10 Essential (primary) hypertension: Secondary | ICD-10-CM

## 2022-05-16 MED ORDER — SPIRONOLACTONE 25 MG PO TABS: 25.0000 mg | ORAL_TABLET | Freq: Every day | ORAL | 1 refills | Status: DC

## 2022-05-16 NOTE — Telephone Encounter (Signed)
Patient lvm requesting refills on Spironolactone and Valsartan-HCTZ.  Per chart Valsartan-HCTZ is not on current med list.  Previous rx from 08/17/21: DIOVAN HCT 320-25 MG tablet DISCONTINUED  Discontinued None Gaye Alken, [facility] 12/16/21 0957   Spironolactone sent to pharmacy.  Patient states she "does not take the Valsartan-HCTZ regularly - when asked how many days a week she estimates she takes this, she could not come up with an answer. Patient states "If she can't do this for me I will call my cardiologist and they will refill it".  Unable to obtain fill history from [facility] as the hold time was very long.   Please advise, thanks!

## 2022-05-19 ENCOUNTER — Other Ambulatory Visit: Payer: Self-pay

## 2022-05-19 MED ORDER — VALSARTAN-HYDROCHLOROTHIAZIDE 320-25 MG PO TABS: 1.0000 | ORAL_TABLET | Freq: Every day | ORAL | 2 refills | Status: DC

## 2022-06-22 DIAGNOSIS — Z800 Family history of malignant neoplasm of digestive organs: Secondary | ICD-10-CM | POA: Insufficient documentation

## 2022-06-22 DIAGNOSIS — R1111 Vomiting without nausea: Secondary | ICD-10-CM | POA: Insufficient documentation

## 2022-06-22 DIAGNOSIS — R194 Change in bowel habit: Secondary | ICD-10-CM | POA: Insufficient documentation

## 2022-06-22 DIAGNOSIS — K219 Gastro-esophageal reflux disease without esophagitis: Secondary | ICD-10-CM | POA: Insufficient documentation

## 2022-06-22 DIAGNOSIS — R140 Abdominal distension (gaseous): Secondary | ICD-10-CM | POA: Insufficient documentation

## 2022-06-22 DIAGNOSIS — R152 Fecal urgency: Secondary | ICD-10-CM | POA: Insufficient documentation

## 2022-07-13 NOTE — Progress Notes (Signed)
duplicate

## 2022-07-14 ENCOUNTER — Ambulatory Visit (INDEPENDENT_AMBULATORY_CARE_PROVIDER_SITE_OTHER): Payer: Medicare Other | Admitting: Nurse Practitioner

## 2022-07-14 ENCOUNTER — Encounter: Payer: Self-pay | Admitting: Nurse Practitioner

## 2022-07-14 VITALS — BP 130/80 | HR 73 | Temp 98.4°F | Ht 64.0 in | Wt 163.6 lb

## 2022-07-14 DIAGNOSIS — Z2821 Immunization not carried out because of patient refusal: Secondary | ICD-10-CM | POA: Diagnosis not present

## 2022-07-14 DIAGNOSIS — Z6828 Body mass index (BMI) 28.0-28.9, adult: Secondary | ICD-10-CM

## 2022-07-14 DIAGNOSIS — E7800 Pure hypercholesterolemia, unspecified: Secondary | ICD-10-CM

## 2022-07-14 DIAGNOSIS — Z79899 Other long term (current) drug therapy: Secondary | ICD-10-CM | POA: Diagnosis not present

## 2022-07-14 DIAGNOSIS — Z0000 Encounter for general adult medical examination without abnormal findings: Secondary | ICD-10-CM

## 2022-07-14 DIAGNOSIS — Z0001 Encounter for general adult medical examination with abnormal findings: Secondary | ICD-10-CM | POA: Diagnosis not present

## 2022-07-14 DIAGNOSIS — E663 Overweight: Secondary | ICD-10-CM

## 2022-07-14 DIAGNOSIS — R7303 Prediabetes: Secondary | ICD-10-CM | POA: Diagnosis not present

## 2022-07-14 DIAGNOSIS — I10 Essential (primary) hypertension: Secondary | ICD-10-CM | POA: Diagnosis not present

## 2022-07-14 DIAGNOSIS — I8393 Asymptomatic varicose veins of bilateral lower extremities: Secondary | ICD-10-CM

## 2022-07-14 DIAGNOSIS — R0789 Other chest pain: Secondary | ICD-10-CM | POA: Diagnosis not present

## 2022-07-14 MED ORDER — ATORVASTATIN CALCIUM 20 MG PO TABS: 20.0000 mg | ORAL_TABLET | Freq: Every day | ORAL | 1 refills | Status: DC

## 2022-07-14 MED ORDER — VALSARTAN-HYDROCHLOROTHIAZIDE 320-25 MG PO TABS: 1.0000 | ORAL_TABLET | Freq: Every day | ORAL | 1 refills | Status: DC

## 2022-07-14 NOTE — Patient Instructions (Signed)

## 2022-07-14 NOTE — Progress Notes (Signed)
Hershal Coria Martin,acting as a Neurosurgeon for Arnette Felts, FNP.,have documented all relevant documentation on the behalf of Arnette Felts, FNP,as directed by  Arnette Felts, FNP while in the presence of Arnette Felts, FNP.   Subjective:     Patient ID: Julia Gates , female    DOB: 10/13/1945 , 77 y.o.   MRN: [id number]   Chief Complaint  Patient presents with   Annual Exam    HPI  Patient presents today for HM. Patient reports compliance with medications and has no other concerns today.   BP Readings from Last 3 Encounters: 07/14/22 : (!) 140/80 03/22/22 : 118/76 02/03/22 : Marland Kitchen 163/84       Past Medical History:  Diagnosis Date   Abnormal glucose level    High cholesterol    Hypertension    Lumbago    Sciatica    Vitamin D deficiency      Family History  Problem Relation Age of Onset   Heart disease Mother    Diabetes Mother    Diabetes Brother    Diabetes Sister      Current Outpatient Medications:    Ascorbic Acid (VITAMIN C) 100 MG tablet, Take 100 mg by mouth as needed., Disp: , Rfl:    cholecalciferol (VITAMIN D3) 25 MCG (1000 UT) tablet, Take 1,000 Units by mouth daily., Disp: , Rfl:    co-enzyme Q-10 30 MG capsule, Take 30 mg by mouth daily., Disp: , Rfl:    Multiple Vitamin (MULTIVITAMIN) tablet, Take 1 tablet by mouth daily., Disp: , Rfl:    NON FORMULARY, Green protein drink, Disp: , Rfl:    NON FORMULARY, Black seed drink, Disp: , Rfl:    Omega-3-6-9 CAPS, Take by mouth., Disp: , Rfl:    Saccharomyces boulardii (PROBIOTIC) 250 MG CAPS, Take 1 capsule by mouth 2 (two) times daily., Disp: 60 capsule, Rfl: 2   spironolactone (ALDACTONE) 25 MG tablet, Take 1 tablet (25 mg total) by mouth daily., Disp: 90 tablet, Rfl: 1   verapamil (CALAN-SR) 240 MG CR tablet, Take 1 tablet (240 mg total) by mouth at bedtime., Disp: 100 tablet, Rfl: 3   atorvastatin (LIPITOR) 20 MG tablet, Take 1 tablet (20 mg total) by mouth at bedtime., Disp: 90 tablet, Rfl: 1   cloNIDine  (CATAPRES - DOSED IN MG/24 HR) 0.1 mg/24hr patch, Place 1 patch (0.1 mg total) onto the skin once a week. (Patient not taking: Reported on 02/03/2022), Disp: 4 patch, Rfl: 3   valsartan-hydrochlorothiazide (DIOVAN-HCT) 320-25 MG tablet, Take 1 tablet by mouth daily., Disp: 90 tablet, Rfl: 1   Allergies  Allergen Reactions   Penicillins Rash      The patient states she is post menopausal status.  No LMP recorded. Patient is postmenopausal.   Negative for: breast discharge, breast lump(s), breast pain and breast self exam. Associated symptoms include abnormal vaginal bleeding. Pertinent negatives include abnormal bleeding (hematology), anxiety, decreased libido, depression, difficulty falling sleep, dyspareunia, history of infertility, nocturia, sexual dysfunction, sleep disturbances, urinary incontinence, urinary urgency, vaginal discharge and vaginal itching. Diet regula- vegetables, meat and starch.The patient states her exercise level is minimal - 2 days a week - mainly walking and stretching  . The patient's tobacco use is:  Social History   Tobacco Use  Smoking Status Never  Smokeless Tobacco Never  . She has been exposed to passive smoke. The patient's alcohol use is:  Social History   Substance and Sexual Activity  Alcohol Use No   Review of Systems  Constitutional: Negative.   HENT: Negative.    Eyes: Negative.   Respiratory: Negative.    Cardiovascular:  Positive for chest pain (sharp pains intermittent that don't last a long time - goes away on its own).  Gastrointestinal: Negative.   Genitourinary: Negative.   Musculoskeletal: Negative.   Skin: Negative.   Neurological: Negative.   Endo/Heme/Allergies: Negative.   Psychiatric/Behavioral: Negative.         Today's Vitals   07/14/22 0914 07/14/22 0928  BP: (!) 140/80 130/80  Pulse: 73   Temp: 98.4 F (36.9 C)   TempSrc: Oral   Weight: 163 lb 9.6 oz (74.2 kg)   Height: 5\' 4"  (1.626 m)   PainSc: 0-No pain     Body mass index is 28.08 kg/m.  Wt Readings from Last 3 Encounters:  07/14/22 163 lb 9.6 oz (74.2 kg)  03/22/22 160 lb (72.6 kg)  02/03/22 160 lb (72.6 kg)    Objective:  Physical Exam Vitals reviewed.  Constitutional:      General: She is not in acute distress.    Appearance: Normal appearance.  HENT:     Head: Normocephalic and atraumatic.     Right Ear: Tympanic membrane, ear canal and external ear normal. There is no impacted cerumen.     Left Ear: Tympanic membrane, ear canal and external ear normal. There is no impacted cerumen.     Nose: Nose normal.     Mouth/Throat:     Mouth: Mucous membranes are moist.  Cardiovascular:     Rate and Rhythm: Normal rate and regular rhythm.     Pulses: Normal pulses.     Heart sounds: Normal heart sounds. No murmur heard. Pulmonary:     Effort: Pulmonary effort is normal. No respiratory distress.     Breath sounds: Normal breath sounds. No wheezing.  Abdominal:     General: Abdomen is flat.     Palpations: Abdomen is soft.  Genitourinary:    Comments: N/A Musculoskeletal:        General: No swelling. Normal range of motion.     Cervical back: Normal range of motion and neck supple. No rigidity.  Skin:    General: Skin is warm and dry.     Capillary Refill: Capillary refill takes less than 2 seconds.     Comments: Varicose veins bilateral lower extremities   Neurological:     General: No focal deficit present.     Mental Status: She is alert and oriented to person, place, and time.     Cranial Nerves: No cranial nerve deficit.     Motor: No weakness.  Psychiatric:        Mood and Affect: Mood normal.        Behavior: Behavior normal.        Thought Content: Thought content normal.        Judgment: Judgment normal.         Assessment And Plan:     1. Annual physical exam Behavior modifications discussed and diet history reviewed.   Pt will continue to exercise regularly and modify diet with low GI, plant based  foods and decrease intake of processed foods.  Recommend intake of daily multivitamin, Vitamin D, and calcium.  Recommend mammogram and colonoscopy (will request records from Dr. Loreta Ave) for preventive screenings, as well as recommend immunizations that include influenza, TDAP, and Shingles Health Maintenance  Topic Date Due   COVID-19 Vaccine (2 - Janssen risk series) 07/13/2019   Colon Cancer Screening  04/06/2021   Zoster (Shingles) Vaccine (1 of 2) 10/14/2022*   Pneumonia Vaccine (1 of 1 - PCV) 01/18/2023*   Flu Shot  10/06/2022   Medicare Annual Wellness Visit  02/04/2023   DTaP/Tdap/Td vaccine (2 - Td or Tdap) 11/10/2031   DEXA scan (bone density measurement)  Completed   Hepatitis C Screening: USPSTF Recommendation to screen - Ages 71-79 yo.  Completed   HPV Vaccine  Aged Out  *Topic was postponed. The date shown is not the original due date.    2. Essential hypertension Comments: Blood pressure is slightly elevated improved with recheck. Continue current medications. EKG shows EKG: RBBB, left axis deviation - bifascicular block HR 73. - EKG 12-Lead - Microalbumin / creatinine urine ratio - POCT urinalysis dipstick - CMP14+EGFR - valsartan-hydrochlorothiazide (DIOVAN-HCT) 320-25 MG tablet; Take 1 tablet by mouth daily.  Dispense: 90 tablet; Refill: 1  3. Prediabetes Comments: Diet controlled, continue focusing on healthy diet low in sugar and starch - Hemoglobin A1c  4. Elevated cholesterol Comments: Cholesterol levels are stable. Continue statin, tolerating well. - Lipid panel - atorvastatin (LIPITOR) 20 MG tablet; Take 1 tablet (20 mg total) by mouth at bedtime.  Dispense: 90 tablet; Refill: 1  5. Herpes zoster vaccination declined Declines shingrix, educated on disease process and is aware if he changes his mind to notify office  6. Overweight with body mass index (BMI) of 28 to 28.9 in adult  7. Other long term (current) drug therapy - CBC  8. Asymptomatic varicose  veins of both lower extremities Advised to wear compression socks especially when standing for long periods. If begins to have pain need to make me aware.   9. Atypical chest pain Comments: She is having sharp intermittent chest pains, she is advised to f/u with Cardiology.    Patient was given opportunity to ask questions. Patient verbalized understanding of the plan and was able to repeat key elements of the plan. All questions were answered to their satisfaction.   Arnette Felts, FNP   I, Arnette Felts, FNP, have reviewed all documentation for this visit. The documentation on 07/14/22 for the exam, diagnosis, procedures, and orders are all accurate and complete.   THE PATIENT IS ENCOURAGED TO PRACTICE SOCIAL DISTANCING DUE TO THE COVID-19 PANDEMIC.

## 2022-07-15 LAB — CMP14+EGFR
ALT: 14 IU/L (ref 0–32)
AST: 18 IU/L (ref 0–40)
Albumin/Globulin Ratio: 1.9 (ref 1.2–2.2)
Albumin: 4.5 g/dL (ref 3.8–4.8)
Alkaline Phosphatase: 57 IU/L (ref 44–121)
BUN/Creatinine Ratio: 17 (ref 12–28)
BUN: 14 mg/dL (ref 8–27)
Bilirubin Total: 0.8 mg/dL (ref 0.0–1.2)
CO2: 23 mmol/L (ref 20–29)
Calcium: 9.7 mg/dL (ref 8.7–10.3)
Chloride: 101 mmol/L (ref 96–106)
Creatinine, Ser: 0.82 mg/dL (ref 0.57–1.00)
Globulin, Total: 2.4 g/dL (ref 1.5–4.5)
Glucose: 107 mg/dL — ABNORMAL HIGH (ref 70–99)
Potassium: 4 mmol/L (ref 3.5–5.2)
Sodium: 140 mmol/L (ref 134–144)
Total Protein: 6.9 g/dL (ref 6.0–8.5)
eGFR: 75 mL/min/{1.73_m2} (ref >59)

## 2022-07-15 LAB — CBC
Hematocrit: 39.6 % (ref 34.0–46.6)
Hemoglobin: 13.7 g/dL (ref 11.1–15.9)
MCH: 30.7 pg (ref 26.6–33.0)
MCHC: 34.6 g/dL (ref 31.5–35.7)
MCV: 89 fL (ref 79–97)
Platelets: 196 10*3/uL (ref 150–450)
RBC: 4.46 x10E6/uL (ref 3.77–5.28)
RDW: 13.5 % (ref 11.7–15.4)
WBC: 4.9 10*3/uL (ref 3.4–10.8)

## 2022-07-15 LAB — LIPID PANEL
Chol/HDL Ratio: 3.2 ratio (ref 0.0–4.4)
Cholesterol, Total: 178 mg/dL (ref 100–199)
HDL: 56 mg/dL (ref >39)
LDL Chol Calc (NIH): 105 mg/dL — ABNORMAL HIGH (ref 0–99)
Triglycerides: 95 mg/dL (ref 0–149)
VLDL Cholesterol Cal: 17 mg/dL (ref 5–40)

## 2022-07-15 LAB — MICROALBUMIN / CREATININE URINE RATIO
Creatinine, Urine: 14.7 mg/dL
Microalb/Creat Ratio: <20 mg/g creat (ref 0–29)
Microalbumin, Urine: <3 ug/mL

## 2022-07-15 LAB — HEMOGLOBIN A1C
Est. average glucose Bld gHb Est-mCnc: 146 mg/dL
Hgb A1c MFr Bld: 6.7 % — ABNORMAL HIGH (ref 4.8–5.6)

## 2022-07-20 ENCOUNTER — Other Ambulatory Visit: Payer: Self-pay | Admitting: Nurse Practitioner

## 2022-07-20 DIAGNOSIS — Z1231 Encounter for screening mammogram for malignant neoplasm of breast: Secondary | ICD-10-CM

## 2022-07-31 DIAGNOSIS — E7800 Pure hypercholesterolemia, unspecified: Secondary | ICD-10-CM | POA: Insufficient documentation

## 2022-08-05 LAB — POCT URINALYSIS DIPSTICK
Bilirubin, UA: NEGATIVE
Glucose, UA: NEGATIVE
Ketones, UA: NEGATIVE
Leukocytes, UA: NEGATIVE
Nitrite, UA: NEGATIVE
Protein, UA: NEGATIVE
Spec Grav, UA: 1.025 (ref 1.010–1.025)
Urobilinogen, UA: 0.2 E.U./dL
pH, UA: 6.5 (ref 5.0–8.0)

## 2022-08-10 ENCOUNTER — Ambulatory Visit: Payer: Medicare Other

## 2022-08-22 NOTE — Addendum Note (Signed)
Addended by: Arnette Felts F on: 08/22/2022 12:55 PM   Modules accepted: Level of Service

## 2022-08-30 ENCOUNTER — Ambulatory Visit
Admission: RE | Admit: 2022-08-30 | Discharge: 2022-08-30 | Disposition: A | Discharge status: Home / Self Care | Payer: Medicare Other | Source: Ambulatory Visit | Attending: Nurse Practitioner | Admitting: Nurse Practitioner

## 2022-08-30 DIAGNOSIS — Z1231 Encounter for screening mammogram for malignant neoplasm of breast: Secondary | ICD-10-CM | POA: Diagnosis not present

## 2022-09-02 IMAGING — MG MM DIGITAL SCREENING BILAT W/ TOMO AND CAD
6 of 10 series · 6 of 30 positions shown · non-contrast
Comparison: Previous exam(s).

CLINICAL DATA: Screening.

EXAM:
DIGITAL SCREENING BILATERAL MAMMOGRAM WITH TOMOSYNTHESIS AND CAD
TECHNIQUE: Bilateral screening digital craniocaudal and mediolateral oblique
mammograms were obtained. Bilateral screening digital breast
tomosynthesis was performed. The images were evaluated with
computer-aided detection.

[R MLO synth-2D]
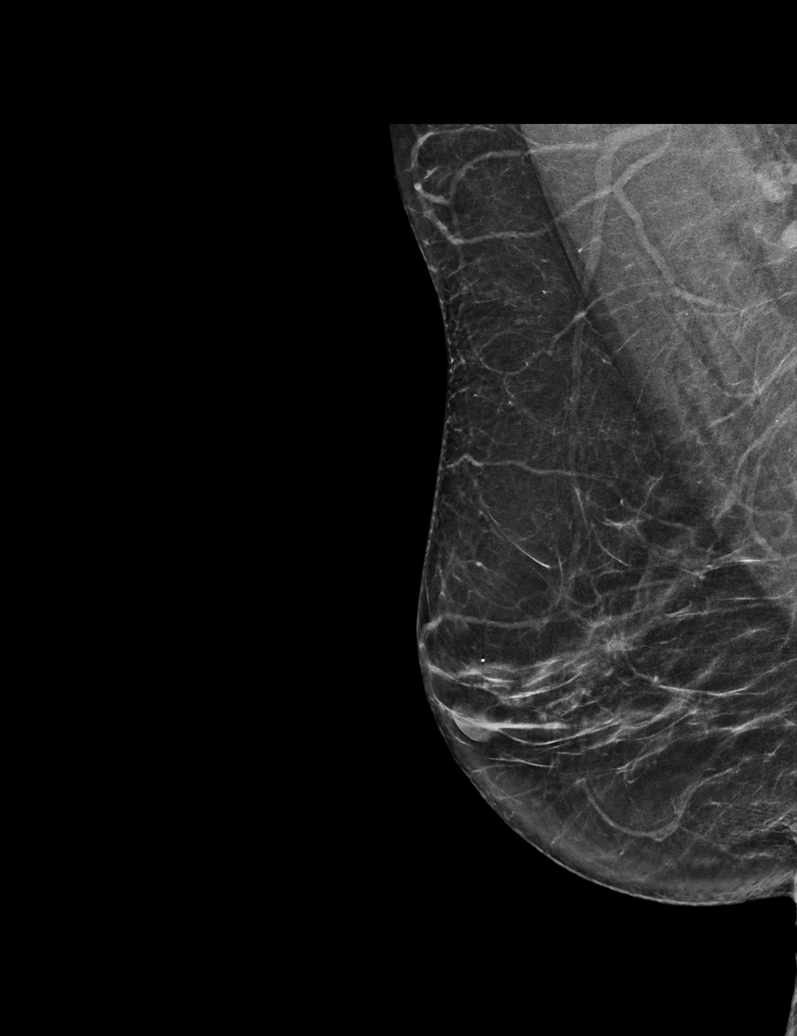

[L CC synth-2D]
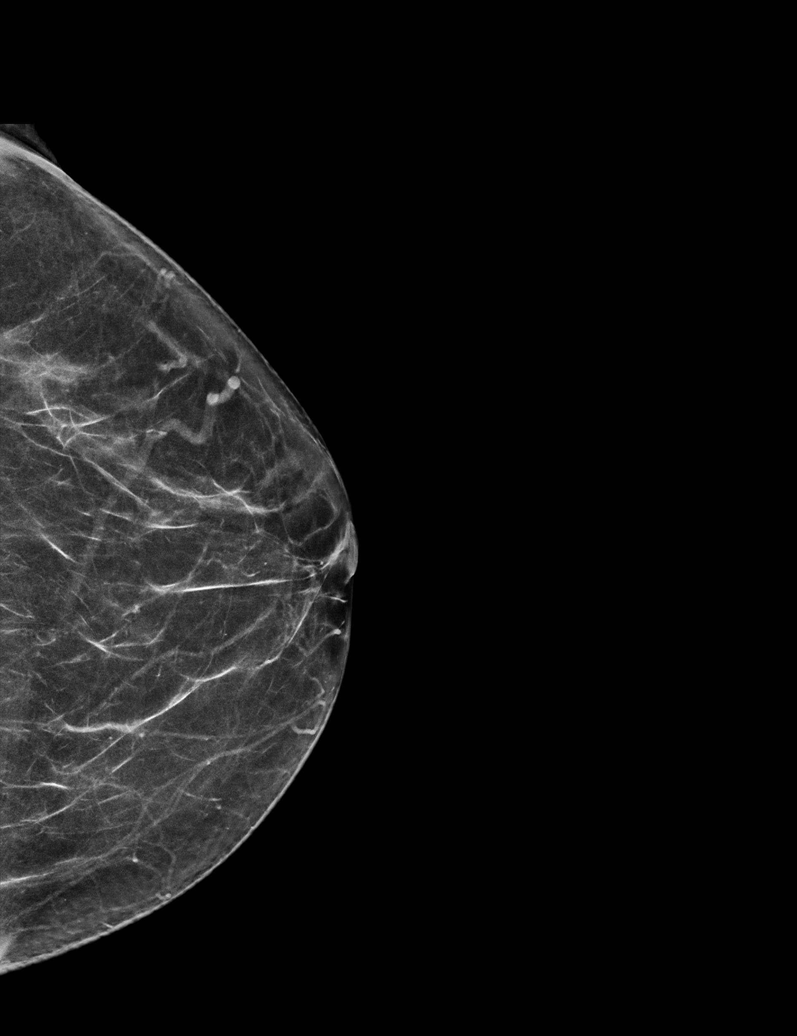

[R CC synth-2D (1 of 2)]
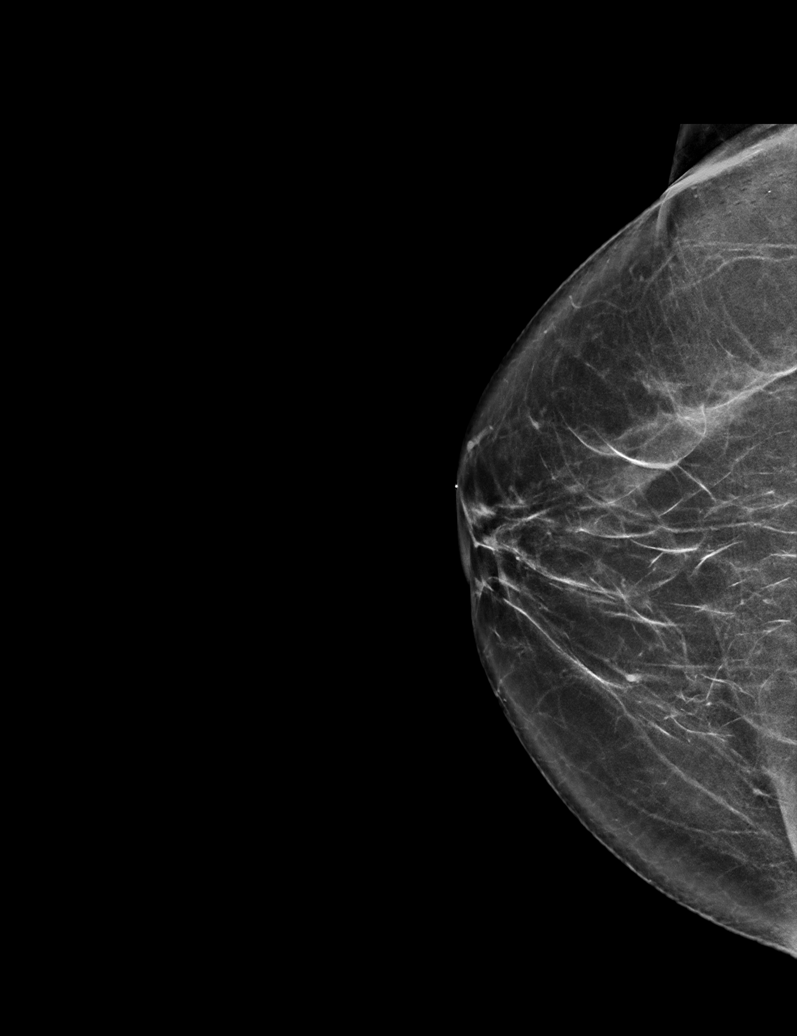

[R CC synth-2D (2 of 2)]
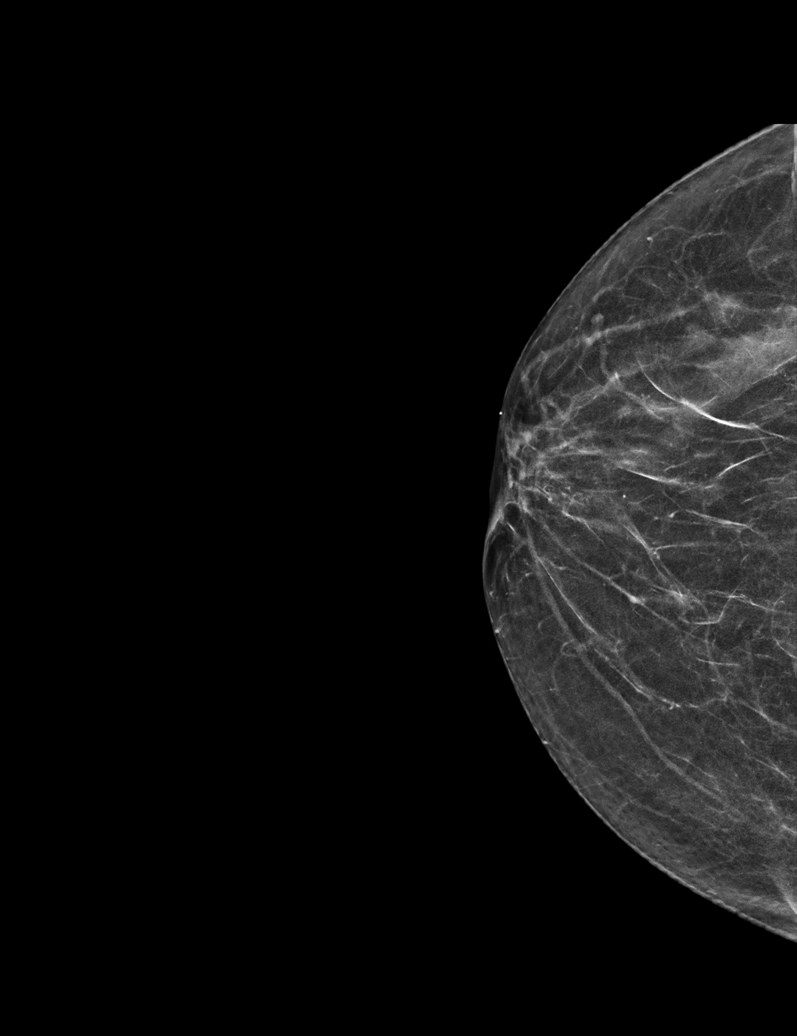

[L MLO synth-2D]
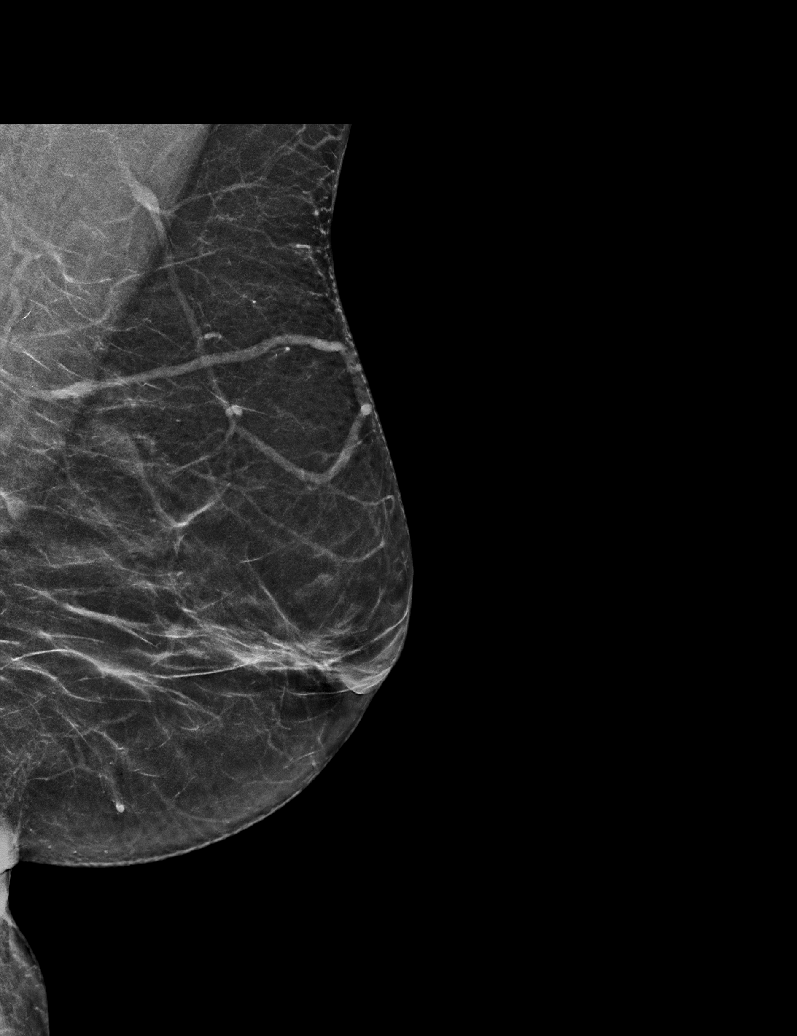

[L MLO tomo · tomo slice 29/57.0]
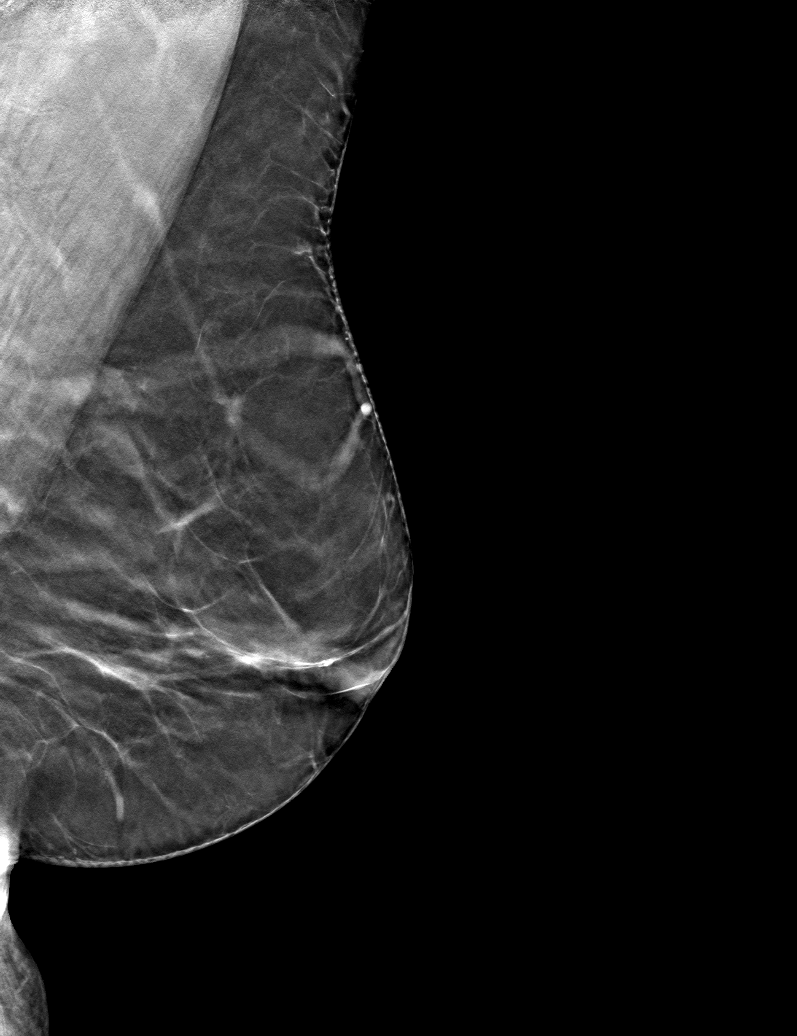

[6 of 30 positions shown; findings below may reference images not displayed]

ACR Breast Density Category b: There are scattered areas of
fibroglandular density.
FINDINGS: There are no findings suspicious for malignancy.
IMPRESSION: No mammographic evidence of malignancy. A result letter of this
screening mammogram will be mailed directly to the patient.

RECOMMENDATION:
Screening mammogram in one year. (Code:51-O-LD2)

BI-RADS CATEGORY  1: Negative.

## 2022-09-21 DIAGNOSIS — H04123 Dry eye syndrome of bilateral lacrimal glands: Secondary | ICD-10-CM | POA: Diagnosis not present

## 2022-09-21 DIAGNOSIS — H53021 Refractive amblyopia, right eye: Secondary | ICD-10-CM | POA: Diagnosis not present

## 2022-09-21 DIAGNOSIS — E119 Type 2 diabetes mellitus without complications: Secondary | ICD-10-CM | POA: Diagnosis not present

## 2022-09-21 DIAGNOSIS — Z961 Presence of intraocular lens: Secondary | ICD-10-CM | POA: Diagnosis not present

## 2022-10-04 ENCOUNTER — Other Ambulatory Visit: Payer: Self-pay | Admitting: Nurse Practitioner

## 2022-10-04 DIAGNOSIS — I10 Essential (primary) hypertension: Secondary | ICD-10-CM

## 2022-10-24 NOTE — Progress Notes (Signed)
SDOH declined

## 2022-10-31 ENCOUNTER — Encounter: Payer: Self-pay | Admitting: *Deleted

## 2022-10-31 NOTE — Progress Notes (Signed)
Pt attended 10/08/22 screening event where her b/p was 144/83. Pt did not share her PCP name at the event and did not identify and SDOH insecurities. Chart review confirms pt's PCP is Julia Felts, FNP at [facility]. Pt saw PCp on 07/14/22 for her annual physical exam and has a future visit with Julia Oms FNP on 01/17/23. In-basket message sent to PCP with event results. No additional health equity team support indicated at this time.

## 2022-11-15 ENCOUNTER — Ambulatory Visit: Payer: Medicare Other

## 2022-11-15 VITALS — BP 130/70 | HR 69 | Temp 98.7°F | Ht 64.0 in | Wt 163.0 lb

## 2022-11-15 DIAGNOSIS — I10 Essential (primary) hypertension: Secondary | ICD-10-CM

## 2022-11-15 NOTE — Progress Notes (Signed)
Patient presents today for a BP check, patient currently taking valsartan-hydrochlorothiazide 320-25mg , spironolactone 25mg . BP Readings from Last 3 Encounters:  11/15/22 130/86  10/08/22 (!) 144/83  07/14/22 130/80   Per provider - that is better continue current medications - follow up next appt 01/17/23.

## 2023-01-17 ENCOUNTER — Ambulatory Visit: Payer: Medicare Other | Admitting: Nurse Practitioner

## 2023-01-17 ENCOUNTER — Encounter: Payer: Self-pay | Admitting: Nurse Practitioner

## 2023-01-17 VITALS — BP 140/90 | HR 63 | Temp 98.3°F | Ht 64.0 in | Wt 161.8 lb

## 2023-01-17 DIAGNOSIS — E782 Mixed hyperlipidemia: Secondary | ICD-10-CM

## 2023-01-17 DIAGNOSIS — I10 Essential (primary) hypertension: Secondary | ICD-10-CM

## 2023-01-17 DIAGNOSIS — Z6827 Body mass index (BMI) 27.0-27.9, adult: Secondary | ICD-10-CM

## 2023-01-17 DIAGNOSIS — Z2821 Immunization not carried out because of patient refusal: Secondary | ICD-10-CM

## 2023-01-17 DIAGNOSIS — Z1211 Encounter for screening for malignant neoplasm of colon: Secondary | ICD-10-CM | POA: Diagnosis not present

## 2023-01-17 DIAGNOSIS — R7303 Prediabetes: Secondary | ICD-10-CM | POA: Diagnosis not present

## 2023-01-17 MED ORDER — CLONIDINE 0.2 MG/24HR TD PTWK: 0.2000 mg | MEDICATED_PATCH | TRANSDERMAL | 2 refills | Status: DC

## 2023-01-17 NOTE — Progress Notes (Signed)
Madelaine Bhat, CMA,acting as a Neurosurgeon for Arnette Felts, FNP.,have documented all relevant documentation on the behalf of Arnette Felts, FNP,as directed by  Arnette Felts, FNP while in the presence of Arnette Felts, FNP.  Subjective:  Patient ID: Julia Gates , female    DOB: 12/02/1945 , 77 y.o.   MRN: [id number]  Chief Complaint  Patient presents with   Hypertension    HPI  Patient presents today for a bp and pre dm follow up, Patient reports compliance with medication. Patient denies any chest pain, SOB, or headaches. Patient has no concerns today.  She has not seen Dr Jacinto Halim since the Spring. She is not taking the cholesterol medication due to having a "particular feeling" once she stopped the feeling went away. She is not exercising regularly was walking but stopped. They are getting a treadmill this week. She is not taking magnesium.      Past Medical History:  Diagnosis Date   Abnormal glucose level    High cholesterol    Hypertension    Lumbago    Sciatica    Vitamin D deficiency      Family History  Problem Relation Age of Onset   Heart disease Mother    Diabetes Mother    Diabetes Brother    Diabetes Sister      Current Outpatient Medications:    Ascorbic Acid (VITAMIN C) 100 MG tablet, Take 100 mg by mouth as needed., Disp: , Rfl:    cholecalciferol (VITAMIN D3) 25 MCG (1000 UT) tablet, Take 1,000 Units by mouth daily., Disp: , Rfl:    co-enzyme Q-10 30 MG capsule, Take 30 mg by mouth daily., Disp: , Rfl:    Multiple Vitamin (MULTIVITAMIN) tablet, Take 1 tablet by mouth daily., Disp: , Rfl:    NON FORMULARY, Green protein drink, Disp: , Rfl:    NON FORMULARY, Black seed drink, Disp: , Rfl:    Omega-3-6-9 CAPS, Take by mouth., Disp: , Rfl:    Saccharomyces boulardii (PROBIOTIC) 250 MG CAPS, Take 1 capsule by mouth 2 (two) times daily., Disp: 60 capsule, Rfl: 2   spironolactone (ALDACTONE) 25 MG tablet, TAKE ONE TABLET BY MOUTH EVERY DAY, Disp: 90 tablet, Rfl: 1    valsartan-hydrochlorothiazide (DIOVAN-HCT) 320-25 MG tablet, Take 1 tablet by mouth daily., Disp: 90 tablet, Rfl: 1   verapamil (CALAN-SR) 240 MG CR tablet, Take 1 tablet (240 mg total) by mouth at bedtime., Disp: 100 tablet, Rfl: 3   atorvastatin (LIPITOR) 20 MG tablet, Take 1 tablet (20 mg total) by mouth at bedtime. (Patient not taking: Reported on 01/17/2023), Disp: 90 tablet, Rfl: 1   cloNIDine (CATAPRES - DOSED IN MG/24 HR) 0.2 mg/24hr patch, Place 1 patch (0.2 mg total) onto the skin once a week., Disp: 4 patch, Rfl: 2   Allergies  Allergen Reactions   Penicillins Rash     Review of Systems  Constitutional: Negative.   HENT: Negative.    Eyes: Negative.   Respiratory: Negative.    Cardiovascular: Negative.   Gastrointestinal: Negative.   Musculoskeletal: Negative.   Neurological: Negative.   Psychiatric/Behavioral: Negative.       Today's Vitals   01/17/23 0925 01/17/23 1029  BP: (!) 160/90 (!) 140/90  Pulse: 63   Temp: 98.3 F (36.8 C)   TempSrc: Oral   Weight: 161 lb 12.8 oz (73.4 kg)   Height: 5\' 4"  (1.626 m)   PainSc: 0-No pain    Body mass index is 27.77 kg/m.  Wt Readings  from Last 3 Encounters:  01/17/23 161 lb 12.8 oz (73.4 kg)  11/15/22 163 lb (73.9 kg)  07/14/22 163 lb 9.6 oz (74.2 kg)     Objective:  Physical Exam Vitals reviewed.  Constitutional:      General: She is not in acute distress.    Appearance: Normal appearance.  Cardiovascular:     Rate and Rhythm: Normal rate and regular rhythm.     Pulses: Normal pulses.     Heart sounds: Normal heart sounds. No murmur heard. Pulmonary:     Effort: Pulmonary effort is normal. No respiratory distress.     Breath sounds: Normal breath sounds. No wheezing.  Skin:    General: Skin is warm and dry.     Capillary Refill: Capillary refill takes less than 2 seconds.  Neurological:     General: No focal deficit present.     Mental Status: She is alert and oriented to person, place, and time.      Cranial Nerves: No cranial nerve deficit.     Motor: No weakness.  Psychiatric:        Mood and Affect: Mood normal.        Behavior: Behavior normal.        Thought Content: Thought content normal.        Judgment: Judgment normal.         Assessment And Plan:  Essential hypertension Assessment & Plan: Blood pressure is poorly controlled.  She is not using her clonidine patch reported due to cost.  Consulted with Dr. Jacinto Halim who recommended to restart her on the patch we will send that in to pharmacy. She is to f/u with Dr. Jacinto Halim as well for an appt  Orders: -     Basic metabolic panel -     cloNIDine; Place 1 patch (0.2 mg total) onto the skin once a week.  Dispense: 4 patch; Refill: 2  Prediabetes Assessment & Plan: Hemoglobin A1c is stable.  Diet controlled.  Orders: -     Basic metabolic panel -     Hemoglobin A1c  Mixed hyperlipidemia Assessment & Plan: Cholesterol levels are stable.  Continue statin  Orders: -     Basic metabolic panel -     Lipid panel  Herpes zoster vaccination declined Assessment & Plan: Declines shingrix, educated on disease process and is aware if he changes his mind to notify office    Influenza vaccination declined Assessment & Plan: Patient declined influenza vaccination at this time. Patient is aware that influenza vaccine prevents illness in 70% of healthy people, and reduces hospitalizations to 30-70% in elderly. This vaccine is recommended annually. Education has been provided regarding the importance of this vaccine but patient still declined. Advised may receive this vaccine at local pharmacy or Health Dept.or vaccine clinic. Aware to provide a copy of the vaccination record if obtained from local pharmacy or Health Dept.  Pt is willing to accept risk associated with refusing vaccination.    COVID-19 vaccination declined Assessment & Plan: Declines covid 19 vaccine. Discussed risk of covid 66 and if she changes her mind about the  vaccine to call the office. Education has been provided regarding the importance of this vaccine but patient still declined. Advised may receive this vaccine at local pharmacy or Health Dept.or vaccine clinic. Aware to provide a copy of the vaccination record if obtained from local pharmacy or Health Dept.  Encouraged to take multivitamin, vitamin d, vitamin c and zinc to increase immune system. Aware  can call office if would like to have vaccine here at office. Verbalized acceptance and understanding.    BMI 27.0-27.9,adult  Encounter for screening colonoscopy Assessment & Plan: According to USPTF Colorectal cancer Screening guidelines. Colonoscopy is recommended every 10 years, starting at age 65 years. Will refer to GI for colon cancer screening.   Orders: -     Ambulatory referral to Gastroenterology    Return for 6 month bp check.  Patient was given opportunity to ask questions. Patient verbalized understanding of the plan and was able to repeat key elements of the plan. All questions were answered to their satisfaction.     Jeanell Sparrow, FNP, have reviewed all documentation for this visit. The documentation on 01/17/23 for the exam, diagnosis, procedures, and orders are all accurate and complete.  IF YOU HAVE BEEN REFERRED TO A SPECIALIST, IT MAY TAKE 1-2 WEEKS TO SCHEDULE/PROCESS THE REFERRAL. IF YOU HAVE NOT HEARD FROM US/SPECIALIST IN TWO WEEKS, PLEASE GIVE [place] A CALL AT [phone].

## 2023-01-18 ENCOUNTER — Other Ambulatory Visit: Payer: Self-pay | Admitting: Nurse Practitioner

## 2023-01-18 DIAGNOSIS — I10 Essential (primary) hypertension: Secondary | ICD-10-CM

## 2023-01-18 LAB — BASIC METABOLIC PANEL
BUN/Creatinine Ratio: 16 (ref 12–28)
BUN: 15 mg/dL (ref 8–27)
CO2: 24 mmol/L (ref 20–29)
Calcium: 10.1 mg/dL (ref 8.7–10.3)
Chloride: 100 mmol/L (ref 96–106)
Creatinine, Ser: 0.93 mg/dL (ref 0.57–1.00)
Glucose: 106 mg/dL — ABNORMAL HIGH (ref 70–99)
Potassium: 3.5 mmol/L (ref 3.5–5.2)
Sodium: 140 mmol/L (ref 134–144)
eGFR: 64 mL/min/{1.73_m2} (ref >59)

## 2023-01-18 LAB — HEMOGLOBIN A1C
Est. average glucose Bld gHb Est-mCnc: 143 mg/dL
Hgb A1c MFr Bld: 6.6 % — ABNORMAL HIGH (ref 4.8–5.6)

## 2023-01-18 LAB — LIPID PANEL
Chol/HDL Ratio: 3.4 ratio (ref 0.0–4.4)
Cholesterol, Total: 201 mg/dL — ABNORMAL HIGH (ref 100–199)
HDL: 59 mg/dL (ref >39)
LDL Chol Calc (NIH): 129 mg/dL — ABNORMAL HIGH (ref 0–99)
Triglycerides: 73 mg/dL (ref 0–149)
VLDL Cholesterol Cal: 13 mg/dL (ref 5–40)

## 2023-01-20 ENCOUNTER — Telehealth: Payer: Self-pay

## 2023-01-20 ENCOUNTER — Telehealth: Payer: Self-pay | Admitting: Cardiology

## 2023-01-20 NOTE — Progress Notes (Signed)
   Care Guide Note  01/20/2023 Name: Julia Gates MRN: [id number] DOB: Nov 29, 1945  Referred by: Arnette Felts, FNP Reason for referral : Care Coordination (Outreach to schedule with Pharm d )   Julia Gates is a 77 y.o. year old female who is a primary care patient of Arnette Felts, FNP. Julia Gates was referred to the pharmacist for assistance related to HTN.    Successful contact was made with the patient to discuss pharmacy services including being ready for the pharmacist to call at least 5 minutes before the scheduled appointment time, to have medication bottles and any blood sugar or blood pressure readings ready for review. The patient agreed to meet with the pharmacist via with the pharmacist via telephone visit on (date/time).   01/23/2023 Julia Gates, RMA Care Guide [facility]  [place], [place] [address] Direct Dial: [phone] [email or web address]

## 2023-01-20 NOTE — Telephone Encounter (Signed)
   Pt c/o of Chest Pain: STAT if active CP, including tightness, pressure, jaw pain, radiating pain to shoulder/upper arm/back, CP unrelieved by Nitro. Symptoms reported of SOB, nausea, vomiting, sweating.  1. Are you having CP right now?   No  2. Are you experiencing any other symptoms (ex. SOB, nausea, vomiting, sweating)?   SOB, fluctuating BP   3. Is your CP continuous or coming and going?   Comes and goes  4. Have you taken Nitroglycerin? No  5. How long have you been experiencing CP? Over the last month   6. If NO CP at time of call then end call with telling Pt to call back or call 911 if Chest pain returns prior to return call from triage team.   Patient stated she received a different colored valsartan-hydrochlorothiazide (DIOVAN-HCT) 320-25 MG tablet pill and it has not been working well.  Patient stated she has been having high BP reading - 145/85 to 175/90.

## 2023-01-20 NOTE — Telephone Encounter (Signed)
Spoke with Pt. States she does not feel like medication is working correctly since it "changed colors" and says her BP goes up but is coming back down quickly. Valsartan-hydrochlorothiazide is currently prescribed by PCP.  Pts larger concern was that she was not going to be able to see Dr Jacinto Halim until after the new year. I offered her a appointment for 02/23/23 in which she took but said she felt she should be able to see him sooner. I offered a appointment with a APP but she would like to see Dr Jacinto Halim only. I told her I would forward the message to Dr Jacinto Halim and his nurse to see if there was anything they could work out. Pt stated she was getting "a patch" that Dr Jacinto Halim had wanted her to get this week and she would like to try that for a week or so before we did a medication change. Let her know Dr Jacinto Halim was not in the office this week but that his nurse would reach back out to her once they spoke. Gave 911/ED precautions incase of incased symptoms.

## 2023-01-23 ENCOUNTER — Other Ambulatory Visit: Payer: Medicare Other

## 2023-01-23 NOTE — Telephone Encounter (Signed)
Yates Decamp, MD  Caller: Unspecified (3 days ago, 11:11 AM) She was Rx clonidine patch by her PCP. Is she using this?  Left message to call office

## 2023-01-24 DIAGNOSIS — Z1211 Encounter for screening for malignant neoplasm of colon: Secondary | ICD-10-CM | POA: Insufficient documentation

## 2023-01-24 DIAGNOSIS — Z2821 Immunization not carried out because of patient refusal: Secondary | ICD-10-CM | POA: Insufficient documentation

## 2023-01-24 NOTE — Assessment & Plan Note (Signed)
Cholesterol levels are stable.  Continue statin.

## 2023-01-24 NOTE — Assessment & Plan Note (Signed)

## 2023-01-24 NOTE — Assessment & Plan Note (Signed)
 According to USPTF Colorectal cancer Screening guidelines. Colonoscopy is recommended every 10 years, starting at age 76 years. Will refer to GI for colon cancer screening.

## 2023-01-24 NOTE — Assessment & Plan Note (Signed)
Declines shingrix, educated on disease process and is aware if he changes his mind to notify office  

## 2023-01-24 NOTE — Assessment & Plan Note (Signed)
Hemoglobin A1c is stable.  Diet controlled.

## 2023-01-24 NOTE — Assessment & Plan Note (Addendum)
Blood pressure is poorly controlled.  She is not using her clonidine patch reported due to cost.  Consulted with Dr. Jacinto Halim who recommended to restart her on the patch we will send that in to pharmacy. She is to f/u with Dr. Jacinto Halim as well for an appt

## 2023-01-24 NOTE — Assessment & Plan Note (Signed)

## 2023-01-29 NOTE — Progress Notes (Unsigned)
01/30/2023 Name: Julia Gates MRN: [id number] DOB: [birth year]  Chief Complaint  Patient presents with   Hyperlipidemia   Hypertension    Julia Gates is a 77 y.o. year old female who was referred for medication management by their primary care provider, Arnette Felts, FNP. They presented for a face to face visit today.   They were referred to the pharmacist by their PCP for assistance in accessing clonidine patches   Subjective:  Care Team: Primary Care Provider: Arnette Felts, FNP ; Next Scheduled Visit: 02/08/2023 Cardiologist: Dr. Jacinto Halim; Next Scheduled Visit: 02/23/2023  Medication Access/Adherence  Current Pharmacy:  [facility] - [place], [place] - [address] [facility] [address] [place] [address] [place] [address] Phone: [phone] Fax: [phone]  [facility] - [place], [place] - [address] [place] [address] Phone: [phone] Fax: [phone]   Hypertension:  Current medications: clonidine 0.2 mg once a week, spironolactone 25 mg, valsartan-hydrochlorothiazide 320-25 mg daily  - Stopped verapamil 240 mg daily a few months ago and hasn't started clonidine patches yet   Patient has a validated, automated, upper arm home BP cuff Current blood pressure readings readings: 119/70-160-78   Patient reports hypotensive s/sx including dizziness, lightheadedness occasionally but not a constant. Patient reports hypertensive symptoms including chest tightness that is infrequent and non-severe.   Current meal patterns: doesn't eat a lot of sodium   Current physical activity: just got a treadmill but hasn't started using it yet   Hyperlipidemia/ASCVD Risk Reduction  Current lipid lowering medications: atorvastatin 20 mg daily  - Restarted last week. Reports feeling okay Medications tried in the past:atorvastatin - made her have a "particular feeling"  ASCVD History: none Family History:  diabetes (mother, sister), heart disease (mother) Risk Factors: HTN, HLD  The 10-year ASCVD risk score (Arnett DK, et al., 2019) is: 39%   Values used to calculate the score:     Age: 72 years     Sex: Female     Is Non-Hispanic African American: Yes     Diabetic: Yes     Tobacco smoker: No     Systolic Blood Pressure: 140 mmHg     Is BP treated: Yes     HDL Cholesterol: 59 mg/dL     Total Cholesterol: 201 mg/dL    Objective:  Lab Results  Component Value Date   HGBA1C 6.6 (H) 01/17/2023    Lab Results  Component Value Date   CREATININE 0.93 01/17/2023   BUN 15 01/17/2023   NA 140 01/17/2023   K 3.5 01/17/2023   CL 100 01/17/2023   CO2 24 01/17/2023    Lab Results  Component Value Date   CHOL 201 (H) 01/17/2023   HDL 59 01/17/2023   LDLCALC 129 (H) 01/17/2023   TRIG 73 01/17/2023   CHOLHDL 3.4 01/17/2023    Medications Reviewed Today     Reviewed by Roslyn Smiling, [facility] (Pharmacist) on 01/30/23 at 1109  Med List Status: <None>   Medication Order Taking? Sig Documenting Provider Last Dose Status Informant  Ascorbic Acid (VITAMIN C) 100 MG tablet [id number] No Take 100 mg by mouth as needed.  Patient not taking: Reported on 01/30/2023   [provider] Not Taking Active   atorvastatin (LIPITOR) 20 MG tablet [id number] Yes Take 1 tablet (20 mg total) by mouth at bedtime. Arnette Felts, FNP Taking Active   cholecalciferol (VITAMIN D3) 25 MCG (1000 UT) tablet [id number] Yes Take 1,000 Units by mouth daily.  [provider] Taking Active   cloNIDine (CATAPRES - DOSED IN MG/24 HR) 0.2 mg/24hr patch [id number] Yes Place 1 patch (0.2 mg total) onto the skin once a week. Arnette Felts, FNP Taking Active   co-enzyme Q-10 30 MG capsule [id number] Yes Take 30 mg by mouth daily. [provider] Taking Active   Multiple Vitamin (MULTIVITAMIN) tablet [id number] Yes Take 1 tablet by mouth daily. [provider] Taking Active   NON FORMULARY [id number]   Green protein drink [provider]  Active   Clent Demark [id number]  Black seed drink [provider]  Active   Omega-3-6-9 CAPS [id number] Yes Take by mouth. [provider] Taking Active   Saccharomyces boulardii (PROBIOTIC) 250 MG CAPS [id number] Yes Take 1 capsule by mouth 2 (two) times daily. Arnette Felts, FNP Taking Active   spironolactone (ALDACTONE) 25 MG tablet [id number] Yes TAKE ONE TABLET BY MOUTH EVERY DAY Arnette Felts, FNP Taking Active   valsartan-hydrochlorothiazide (DIOVAN-HCT) 320-25 MG tablet [id number] Yes Take 1 tablet by mouth daily. Arnette Felts, FNP Taking Active   verapamil (CALAN-SR) 240 MG CR tablet [id number] No Take 1 tablet (240 mg total) by mouth at bedtime.  Patient not taking: Reported on 01/30/2023   Yates Decamp, MD Not Taking Active             Assessment/Plan:   Hypertension: - Currently uncontrolled based on home BP readings (goal BP <130/80 mmHg)  - Reviewed long term cardiovascular and renal outcomes of uncontrolled blood pressure - Reviewed appropriate blood pressure monitoring technique and reviewed goal blood pressure. Recommended to check home blood pressure and heart rate daily - Recommend to start amlodipine 5 mg daily and continue spironolactone 25 mg and valsartan-hydrochlorothiazide 320-25 mg daily. Recommend not starting clonidine patches. Could consider clonidine patches if first agents aren't tolerated or bringing patient to goal - Advised patient to monitor chest pain and call PCP or 911 if severe   Hyperlipidemia/ASCVD Risk Reduction: - Currently uncontrolled based on last LDL of 129 (LDL goal <70) - Recommend to continue atorvastatin 20 mg daily    Follow Up Plan: 03/29/2023 with pharmacist via telephone   Roslyn Smiling, PharmD PGY1 Pharmacy Resident 01/30/2023 11:33 AM   I have reviewed the pharmacist's encounter and agree with their documentation.   Catie Eppie Gibson, PharmD, BCACP, CPP [facility] [phone]

## 2023-01-30 ENCOUNTER — Other Ambulatory Visit: Payer: Medicare Other | Admitting: Pharmacist

## 2023-01-30 DIAGNOSIS — E7800 Pure hypercholesterolemia, unspecified: Secondary | ICD-10-CM

## 2023-01-30 DIAGNOSIS — I10 Essential (primary) hypertension: Secondary | ICD-10-CM

## 2023-01-30 MED ORDER — AMLODIPINE BESYLATE 5 MG PO TABS: 5.0000 mg | ORAL_TABLET | Freq: Every day | ORAL | 2 refills | Status: DC

## 2023-01-30 NOTE — Patient Instructions (Signed)
Hi there!  It was great speaking with you today!  Start amlodipine 5 mg daily and continue spironolactone 25 mg and valsartan-hydrochlorothiazide 320-25 mg daily. Do not start clonidine patches.  Monitor chest pain and visit ED if it becomes severe.    Check your blood pressure once daily, and any time you have concerning symptoms like headache, chest pain, dizziness, shortness of breath, or vision changes.   Our goal is less than 130/80.  To appropriately check your blood pressure, make sure you do the following:  1) Avoid caffeine, exercise, or tobacco products for 30 minutes before checking. Empty your bladder. 2) Sit with your back supported in a flat-backed chair. Rest your arm on something flat (arm of the chair, table, etc). 3) Sit still with your feet flat on the floor, resting, for at least 5 minutes.  4) Check your blood pressure. Take 1-2 readings.  5) Write down these readings and bring with you to any provider appointments.  Bring your home blood pressure machine with you to a provider's office for accuracy comparison at least once a year.   Make sure you take your blood pressure medications before you come to any office visit, even if you were asked to fast for labs.   Happy Thanksgiving! Roslyn Smiling, PharmD

## 2023-02-06 NOTE — Telephone Encounter (Signed)
Left message to call office

## 2023-02-07 ENCOUNTER — Ambulatory Visit: Payer: Self-pay | Admitting: Cardiology

## 2023-02-08 ENCOUNTER — Ambulatory Visit: Payer: Medicare Other

## 2023-02-08 ENCOUNTER — Telehealth: Payer: Self-pay | Admitting: Cardiology

## 2023-02-08 DIAGNOSIS — Z0000 Encounter for general adult medical examination without abnormal findings: Secondary | ICD-10-CM | POA: Diagnosis not present

## 2023-02-08 NOTE — Patient Instructions (Signed)
Ms. Julia Gates , Thank you for taking time to come for your Medicare Wellness Visit. I appreciate your ongoing commitment to your health goals. Please review the following plan we discussed and let me know if I can assist you in the future.   Referrals/Orders/Follow-Ups/Clinician Recommendations: none  This is a list of the screening recommended for you and due dates:  Health Maintenance  Topic Date Due   Pneumonia Vaccine (1 of 1 - PCV) Never done   Colon Cancer Screening  04/06/2021   COVID-19 Vaccine (2 - Janssen risk series) 02/24/2023*   Zoster (Shingles) Vaccine (1 of 2) 04/19/2023*   Flu Shot  06/05/2023*   Medicare Annual Wellness Visit  02/08/2024   DTaP/Tdap/Td vaccine (2 - Td or Tdap) 11/10/2031   DEXA scan (bone density measurement)  Completed   Hepatitis C Screening  Completed   HPV Vaccine  Aged Out  *Topic was postponed. The date shown is not the original due date.    Advanced directives: (ACP Link)Information on Advanced Care Planning can be found at [facility] [place] Advance Health Care Directives Advance Health Care Directives ([email or web address]   Next Medicare Annual Wellness Visit scheduled for next year: No, office will schedule  Insert Preventive Care attachment Insert FALL PREVENTION attachment if needed

## 2023-02-08 NOTE — Telephone Encounter (Signed)
See previous cardiology phone note

## 2023-02-08 NOTE — Telephone Encounter (Signed)
I spoke with patient.  She reports her PCP had her see the pharmacist in the office for BP management.  Patient states she was told not to start Clonidine patch.  Pharmacist prescribed amlodipine.  Patient reports she realized she had tried amlodipine in the past and could not take it.  I asked patient to reach out to PCP and make the pharmacist aware of this.  Patient reports she would prefer Dr Jacinto Halim manage her BP medications.  Patient reports BP is fluctuating.  Yesterday was 138 systolic.  Diastolic was in the 80's but she does not have this reading available.  Patient has appointment with Dr Jacinto Halim on 12/19.  I asked her to continue to check BP at home and bring readings to this appointment.

## 2023-02-08 NOTE — Telephone Encounter (Signed)
Patient is returning call in regards to phone encounter from 01/20/23.

## 2023-02-08 NOTE — Progress Notes (Signed)
Subjective:   Julia Gates is a 77 y.o. female who presents for Medicare Annual (Subsequent) preventive examination.  Visit Complete: Virtual I connected with  Sarynity Levay on 02/08/23 by a audio enabled telemedicine application and verified that I am speaking with the correct person using two identifiers.  Patient Location: Home  Provider Location: Office/Clinic  I discussed the limitations of evaluation and management by telemedicine. The patient expressed understanding and agreed to proceed.  Vital Signs: Because this visit was a virtual/telehealth visit, some criteria may be missing or patient reported. Any vitals not documented were not able to be obtained and vitals that have been documented are patient reported.    Cardiac Risk Factors include: advanced age (>66men, >33 women);hypertension     Objective:    Today's Vitals   02/08/23 1056  PainSc: 5    There is no height or weight on file to calculate BMI.     02/08/2023   11:05 AM 02/03/2022   11:40 AM 01/14/2021    9:47 AM 01/09/2020   10:17 AM 01/08/2019    9:50 AM 12/28/2017   10:03 AM  Advanced Directives  Does Patient Have a Medical Advance Directive? No No No No No No  Would patient like information on creating a medical advance directive?   Yes (MAU/Ambulatory/Procedural Areas - Information given) Yes (MAU/Ambulatory/Procedural Areas - Information given)  Yes (MAU/Ambulatory/Procedural Areas - Information given)    Current Medications (verified) Outpatient Encounter Medications as of 02/08/2023  Medication Sig   atorvastatin (LIPITOR) 20 MG tablet Take 1 tablet (20 mg total) by mouth at bedtime.   cholecalciferol (VITAMIN D3) 25 MCG (1000 UT) tablet Take 1,000 Units by mouth daily.   co-enzyme Q-10 30 MG capsule Take 30 mg by mouth daily.   Multiple Vitamin (MULTIVITAMIN) tablet Take 1 tablet by mouth daily.   NON FORMULARY Green protein drink   NON FORMULARY Black seed drink   Omega-3-6-9 CAPS Take  by mouth.   Saccharomyces boulardii (PROBIOTIC) 250 MG CAPS Take 1 capsule by mouth 2 (two) times daily.   spironolactone (ALDACTONE) 25 MG tablet TAKE ONE TABLET BY MOUTH EVERY DAY   valsartan-hydrochlorothiazide (DIOVAN-HCT) 320-25 MG tablet Take 1 tablet by mouth daily.   amLODipine (NORVASC) 5 MG tablet Take 1 tablet (5 mg total) by mouth daily. (Patient not taking: Reported on 02/08/2023)   Ascorbic Acid (VITAMIN C) 100 MG tablet Take 100 mg by mouth as needed. (Patient not taking: Reported on 01/30/2023)   cloNIDine (CATAPRES - DOSED IN MG/24 HR) 0.2 mg/24hr patch Place 1 patch (0.2 mg total) onto the skin once a week. (Patient not taking: Reported on 02/08/2023)   No facility-administered encounter medications on file as of 02/08/2023.    Allergies (verified) Penicillins   History: Past Medical History:  Diagnosis Date   Abnormal glucose level    High cholesterol    Hypertension    Lumbago    Sciatica    Vitamin D deficiency    Past Surgical History:  Procedure Laterality Date   CATARACT EXTRACTION Right 12/2018   CATARACT EXTRACTION, BILATERAL  01/2019   TUBAL LIGATION     age 14   Family History  Problem Relation Age of Onset   Heart disease Mother    Diabetes Mother    Diabetes Brother    Diabetes Sister    Social History   Socioeconomic History   Marital status: Married    Spouse name: Not on file   Number of children: 3  Years of education: Not on file   Highest education level: Not on file  Occupational History   Occupation: retired  Tobacco Use   Smoking status: Never   Smokeless tobacco: Never  Vaping Use   Vaping status: Never Used  Substance and Sexual Activity   Alcohol use: No   Drug use: No   Sexual activity: Yes  Other Topics Concern   Not on file  Social History Narrative   Patient lives at home with her husband and she is retired. She has a high school education and 3 children   Social Determinants of Health   Financial Resource  Strain: Low Risk  (02/08/2023)   Overall Financial Resource Strain (CARDIA)    Difficulty of Paying Living Expenses: Not hard at all  Food Insecurity: No Food Insecurity (02/08/2023)   Hunger Vital Sign    Worried About Running Out of Food in the Last Year: Never true    Ran Out of Food in the Last Year: Never true  Transportation Needs: No Transportation Needs (02/08/2023)   PRAPARE - Administrator, Civil Service (Medical): No    Lack of Transportation (Non-Medical): No  Physical Activity: Inactive (02/08/2023)   Exercise Vital Sign    Days of Exercise per Week: 0 days    Minutes of Exercise per Session: 0 min  Stress: No Stress Concern Present (02/08/2023)   [employer] of Occupational Health - Occupational Stress Questionnaire    Feeling of Stress : Not at all  Social Connections: Moderately Integrated (02/08/2023)   Social Connection and Isolation Panel [NHANES]    Frequency of Communication with Friends and Family: More than three times a week    Frequency of Social Gatherings with Friends and Family: Twice a week    Attends Religious Services: More than 4 times per year    Active Member of [employer] or Organizations: No    Attends Engineer, structural: Never    Marital Status: Married    Tobacco Counseling Counseling given: Not Answered   Clinical Intake:  Pre-visit preparation completed: Yes  Pain : 0-10 Pain Score: 5  Pain Type: Chronic pain Pain Location: Shoulder Pain Orientation: Left, Right Pain Descriptors / Indicators: Aching Pain Onset: More than a month ago Pain Frequency: Intermittent     Nutritional Risks: None Diabetes: No  How often do you need to have someone help you when you read instructions, pamphlets, or other written materials from your doctor or pharmacy?: 1 - Never  Interpreter Needed?: No  Information entered by :: NAllen LPN   Activities of Daily Living    02/08/2023   10:59 AM  In your present state of  health, do you have any difficulty performing the following activities:  Hearing? 0  Vision? 1  Comment sometimes with reading  Difficulty concentrating or making decisions? 1  Walking or climbing stairs? 0  Dressing or bathing? 0  Doing errands, shopping? 0  Preparing Food and eating ? N  Using the Toilet? N  In the past six months, have you accidently leaked urine? Y  Comment sometimes  Do you have problems with loss of bowel control? N  Managing your Medications? N  Managing your Finances? N  Housekeeping or managing your Housekeeping? N    Patient Care Team: Arnette Felts, FNP as PCP - General (General Practice)  Indicate any recent Medical Services you may have received from other than [facility] providers in the past year (date may be approximate).  Assessment:   This is a routine wellness examination for [place].  Hearing/Vision screen Hearing Screening - Comments:: Denies hearing issues Vision Screening - Comments:: Regular eye exams, [facility]   Goals Addressed             This Visit's Progress    Patient Stated       02/08/2023, wants to start using treadmill regularly       Depression Screen    02/08/2023   11:08 AM 07/14/2022    9:14 AM 03/22/2022    8:58 AM 02/03/2022   11:41 AM 01/17/2022    3:09 PM 01/14/2021    9:49 AM 01/09/2020   10:18 AM  PHQ 2/9 Scores  PHQ - 2 Score 0 0 0 0 0 0 0  PHQ- 9 Score 0 0     3    Fall Risk    02/08/2023   11:06 AM 07/14/2022    9:14 AM 03/22/2022    8:57 AM 02/03/2022   11:41 AM 01/17/2022    3:09 PM  Fall Risk   Falls in the past year? 1 0 0 0 0  Comment foot overturned      Number falls in past yr: 0 0 0 0 0  Injury with Fall? 0 0 0 0 0  Risk for fall due to : Medication side effect No Fall Risks No Fall Risks Medication side effect No Fall Risks  Follow up Falls prevention discussed;Falls evaluation completed Falls evaluation completed Falls evaluation completed Falls prevention discussed;Education  provided;Falls evaluation completed Falls evaluation completed    MEDICARE RISK AT HOME: Medicare Risk at Home Any stairs in or around the home?: Yes If so, are there any without handrails?: No Home free of loose throw rugs in walkways, pet beds, electrical cords, etc?: Yes Adequate lighting in your home to reduce risk of falls?: Yes Life alert?: No Use of a cane, walker or w/c?: No Grab bars in the bathroom?: Yes Shower chair or bench in shower?: Yes Elevated toilet seat or a handicapped toilet?: Yes  TIMED UP AND GO:  Was the test performed?  No    Cognitive Function:        02/08/2023   11:08 AM 02/03/2022   11:44 AM 01/14/2021    9:53 AM 01/09/2020   10:21 AM 01/08/2019    9:57 AM  6CIT Screen  What Year? 0 points 0 points 0 points 0 points 0 points  What month? 0 points 0 points 0 points 0 points 0 points  What time? 0 points 0 points 0 points 0 points 0 points  Count back from 20 0 points 0 points 0 points 0 points 0 points  Months in reverse 0 points 0 points 0 points 0 points 0 points  Repeat phrase 0 points 0 points 2 points 0 points 0 points  Total Score 0 points 0 points 2 points 0 points 0 points    Immunizations Immunization History  Administered Date(s) Administered   Janssen (J&J) SARS-COV-2 Vaccination 06/15/2019   Tdap 11/09/2021    TDAP status: Up to date  Flu Vaccine status: Declined, Education has been provided regarding the importance of this vaccine but patient still declined. Advised may receive this vaccine at local pharmacy or Health Dept. Aware to provide a copy of the vaccination record if obtained from local pharmacy or Health Dept. Verbalized acceptance and understanding.  Pneumococcal vaccine status: Due, Education has been provided regarding the importance of this vaccine. Advised may receive  this vaccine at local pharmacy or Health Dept. Aware to provide a copy of the vaccination record if obtained from local pharmacy or Health Dept.  Verbalized acceptance and understanding.  Covid-19 vaccine status: Declined, Education has been provided regarding the importance of this vaccine but patient still declined. Advised may receive this vaccine at local pharmacy or Health Dept.or vaccine clinic. Aware to provide a copy of the vaccination record if obtained from local pharmacy or Health Dept. Verbalized acceptance and understanding.  Qualifies for Shingles Vaccine? Yes   Zostavax completed No   Shingrix Completed?: No.    Education has been provided regarding the importance of this vaccine. Patient has been advised to call insurance company to determine out of pocket expense if they have not yet received this vaccine. Advised may also receive vaccine at local pharmacy or Health Dept. Verbalized acceptance and understanding.  Screening Tests Health Maintenance  Topic Date Due   Pneumonia Vaccine 63+ Years old (1 of 1 - PCV) Never done   Colonoscopy  04/06/2021   COVID-19 Vaccine (2 - Janssen risk series) 02/24/2023 (Originally 07/13/2019)   Zoster Vaccines- Shingrix (1 of 2) 04/19/2023 (Originally 11/14/1964)   INFLUENZA VACCINE  06/05/2023 (Originally 10/06/2022)   Medicare Annual Wellness (AWV)  02/08/2024   DTaP/Tdap/Td (2 - Td or Tdap) 11/10/2031   DEXA SCAN  Completed   Hepatitis C Screening  Completed   HPV VACCINES  Aged Out    Health Maintenance  Health Maintenance Due  Topic Date Due   Pneumonia Vaccine 5+ Years old (1 of 1 - PCV) Never done   Colonoscopy  04/06/2021    Colorectal cancer screening: Type of screening: Colonoscopy. Completed 04/06/2016. Repeat every 5 years  Mammogram status: Completed 08/30/2022. Repeat every year  Bone Density status: Completed 01/04/2022.   Lung Cancer Screening: (Low Dose CT Chest recommended if Age 58-80 years, 20 pack-year currently smoking OR have quit w/in 15years.) does not qualify.   Lung Cancer Screening Referral: no  Additional Screening:  Hepatitis C Screening: does  qualify; Completed 07/09/2019  Vision Screening: Recommended annual ophthalmology exams for early detection of glaucoma and other disorders of the eye. Is the patient up to date with their annual eye exam?  Yes  Who is the provider or what is the name of the office in which the patient attends annual eye exams? [facility] If pt is not established with a provider, would they like to be referred to a provider to establish care? No .   Dental Screening: Recommended annual dental exams for proper oral hygiene  Diabetic Foot Exam: n/a  Community Resource Referral / Chronic Care Management: CRR required this visit?  No   CCM required this visit?  No     Plan:     I have personally reviewed and noted the following in the patient's chart:   Medical and social history Use of alcohol, tobacco or illicit drugs  Current medications and supplements including opioid prescriptions. Patient is not currently taking opioid prescriptions. Functional ability and status Nutritional status Physical activity Advanced directives List of other physicians Hospitalizations, surgeries, and ER visits in previous 12 months Vitals Screenings to include cognitive, depression, and falls Referrals and appointments  In addition, I have reviewed and discussed with patient certain preventive protocols, quality metrics, and best practice recommendations. A written personalized care plan for preventive services as well as general preventive health recommendations were provided to patient.     Barb Merino, LPN   16/03/958  After Visit Summary: (MyChart) Due to this being a telephonic visit, the after visit summary with patients personalized plan was offered to patient via MyChart   Nurse Notes: none

## 2023-02-23 ENCOUNTER — Ambulatory Visit: Payer: Medicare Other | Admitting: Cardiology

## 2023-02-26 NOTE — Progress Notes (Unsigned)
Cardiology Office Note:  .   Date:  02/28/2023  ID:  Julia Gates, DOB [birth year], MRN [id number] PCP: Arnette Felts, FNP  [facility] Providers Cardiologist:  Yates Decamp, MD   History of Present Illness: .   Julia Gates is a 77 y.o. Afro-American female with difficult control hypertension, diet-controlled diabetes and hypertension with hypertensive heart. She does not want to be on statins, she is also very particular about the medications.   Discussed the use of AI scribe software for clinical note transcription with the patient, who gave verbal consent to proceed.  History of Present Illness   The patient, with a history of hypertension, presents with concerns about fluctuating blood pressure, which is mostly high, averaging between the 140s and 160s. The patient reports feeling well when the blood pressure is in the 130s to low 140s. She also mentions occasional chest discomfort and shortness of breath, occurring about two to three times a week, primarily in the morning. The discomfort does not last long and does not significantly impact the patient's daily activities. The patient is adherent to her current antihypertensive regimen, which includes spironolactone and valsartan, taken daily in the morning or midday. She also engages in regular physical activity, including daily treadmill exercises and walking. The patient expresses concern about potential arterial blockage due to stories of acquaintances experiencing heart attacks and bypass surgeries.      Review of Systems  Cardiovascular:  Positive for chest pain and dyspnea on exertion. Negative for leg swelling, orthopnea and palpitations.    Labs   Lab Results  Component Value Date   CHOL 201 (H) 01/17/2023   HDL 59 01/17/2023   LDLCALC 129 (H) 01/17/2023   TRIG 73 01/17/2023   CHOLHDL 3.4 01/17/2023   Lab Results  Component Value Date   NA 140 01/17/2023   K 3.5 01/17/2023   CO2 24 01/17/2023   GLUCOSE 106  (H) 01/17/2023   BUN 15 01/17/2023   CREATININE 0.93 01/17/2023   CALCIUM 10.1 01/17/2023   GFR 71.29 12/27/2016   EGFR 64 01/17/2023   GFRNONAA 65 04/14/2020      Latest Ref Rng & Units 01/17/2023   10:38 AM 07/14/2022   10:14 AM 03/22/2022    9:33 AM  BMP  Glucose 70 - 99 mg/dL 272  536  644   BUN 8 - 27 mg/dL 15  14  20    Creatinine 0.57 - 1.00 mg/dL 0.34  7.42  5.95   BUN/Creat Ratio 12 - 28 16  17  21    Sodium 134 - 144 mmol/L 140  140  137   Potassium 3.5 - 5.2 mmol/L 3.5  4.0  4.1   Chloride 96 - 106 mmol/L 100  101  99   CO2 20 - 29 mmol/L 24  23  22    Calcium 8.7 - 10.3 mg/dL 63.8  9.7  75.6       Latest Ref Rng & Units 07/14/2022   10:14 AM 03/22/2022    9:33 AM 07/07/2021   10:42 AM  CBC  WBC 3.4 - 10.8 x10E3/uL 4.9  5.5  5.6   Hemoglobin 11.1 - 15.9 g/dL 43.3  29.5  18.8   Hematocrit 34.0 - 46.6 % 39.6  43.9  43.6   Platelets 150 - 450 x10E3/uL 196  205  187    Lab Results  Component Value Date   HGBA1C 6.6 (H) 01/17/2023    Lab Results  Component Value Date   TSH 3.890  03/22/2022    Physical Exam:   VS:  BP (!) 142/82 (BP Location: Left Arm, Patient Position: Sitting, Cuff Size: Normal)   Pulse 61   Resp 16   Ht 5\' 4"  (1.626 m)   Wt 166 lb 6.4 oz (75.5 kg)   SpO2 97%   BMI 28.56 kg/m    Wt Readings from Last 3 Encounters:  02/28/23 166 lb 6.4 oz (75.5 kg)  01/17/23 161 lb 12.8 oz (73.4 kg)  11/15/22 163 lb (73.9 kg)     Physical Exam  Studies Reviewed: .    Echocardiogram 07/31/2018: Normal LV systolic function with EF 56%. Left ventricle cavity is normal in size. Moderate concentric hypertrophy of the left ventricle. Normal global wall motion. Doppler evidence of grade I (impaired) diastolic dysfunction, normal LAP. Calculated EF 56%. Left atrial cavity is mildly dilated. Mild (Grade I) mitral regurgitation. Mild to moderate tricuspid regurgitation. Estimated pulmonary artery systolic pressure 24 mmHg, which is normal.  No significant change  compared to previous study in 2015.  EKG:    EKG Interpretation Date/Time:  Tuesday February 28 2023 08:50:57 EST Ventricular Rate:  75 PR Interval:  196 QRS Duration:  140 QT Interval:  408 QTC Calculation: 455 R Axis:   -14  Text Interpretation: EKG 02/28/2023: Normal sinus rhythm at rate of 75 bpm, LAE, normal axis, right bundle branch block.  No evidence of ischemia.  Compared to 12/08/2021, no significant change. Confirmed by Delrae Rend [phone]) on 02/28/2023 9:12:15 AM    EKG 12/08/2021: Normal sinus rhythm at rate of 68 bpm, left atrial enlargement, right bundle branch block. No evidence of ischemia.   Medications and allergies    Allergies  Allergen Reactions   Amlodipine Besylate Swelling   Penicillins Rash     Current Outpatient Medications:    Ascorbic Acid (VITAMIN C) 100 MG tablet, Take 100 mg by mouth as needed., Disp: , Rfl:    atorvastatin (LIPITOR) 20 MG tablet, Take 1 tablet (20 mg total) by mouth at bedtime., Disp: 90 tablet, Rfl: 1   cholecalciferol (VITAMIN D3) 25 MCG (1000 UT) tablet, Take 1,000 Units by mouth daily., Disp: , Rfl:    co-enzyme Q-10 30 MG capsule, Take 30 mg by mouth daily., Disp: , Rfl:    Multiple Vitamin (MULTIVITAMIN) tablet, Take 1 tablet by mouth daily., Disp: , Rfl:    NON FORMULARY, Green protein drink, Disp: , Rfl:    NON FORMULARY, Black seed drink, Disp: , Rfl:    Omega-3-6-9 CAPS, Take by mouth., Disp: , Rfl:    spironolactone (ALDACTONE) 25 MG tablet, TAKE ONE TABLET BY MOUTH EVERY DAY, Disp: 90 tablet, Rfl: 1   valsartan-hydrochlorothiazide (DIOVAN-HCT) 320-25 MG tablet, Take 1 tablet by mouth daily., Disp: 90 tablet, Rfl: 1   amLODipine (NORVASC) 5 MG tablet, Take 1 tablet (5 mg total) by mouth daily., Disp: , Rfl:    ASSESSMENT AND PLAN: .      ICD-10-CM   1. Primary hypertension  I10 EKG 12-Lead    amLODipine (NORVASC) 5 MG tablet    2. Precordial pain  R07.2 Cardiac Stress Test: Informed Consent Details:  Physician/Practitioner Attestation; Transcribe to consent form and obtain patient signature    MYOCARDIAL PERFUSION IMAGING    3. Mild hypercholesterolemia  E78.00     4. Type 2 diabetes mellitus without complication, without long-term current use of insulin (HCC)  E11.9 Cardiac Stress Test: Informed Consent Details: Physician/Practitioner Attestation; Transcribe to consent form and obtain patient signature  5. Pure hypercholesterolemia  E78.00      Assessment and Plan    Hypertension Blood pressure fluctuating, mostly elevated, with occasional lightheadedness when BP is high. Currently on Spironolactone 25mg  and Valsartan. Discussed options for additional antihypertensive therapy including increasing Spironolactone to 50mg , adding Amlodipine 5mg  in the afternoon, or Clonidine patch 0.2mg  (cut in half) are options to consider, previously with 10 mg amlodipine she had developed significant leg edema but she is willing to try 5 mg. -Start Amlodipine 5mg  in the afternoon, monitor for leg swelling. -If Amlodipine causes leg swelling, consider Clonidine patch 0.2mg  (cut in half).  Chest Pain and Shortness of Breath Occasional chest discomfort and shortness of breath, more frequent in the morning. No limitations in exercise capacity. Discussed concerns about coronary artery disease.  But symptoms are new and in view of diabetes mellitus, hypertension, and uncontrolled hypercholesterolemia will proceed with further evaluation by nuclear stress test. -Order exercise nuclear stress test to evaluate for ischemia.  Hypercholesterolemia Patient has not been taking Crestor on a regular basis.  I have discussed with her her goal LDL is <70 and that her LDL is markedly elevated.  Lipitor 20 mg daily without missing doses.  Will recommend recheck of her lipids at some point during her follow-up with PCP and if LDL is not controlled, consider addition of Zetia 10 mg daily.  -Patient will message me if her  blood pressure is controlled we will do 90-day prescriptions.  Continue spironolactone and valsartan HCT for now. -Follow-up in 1 year or sooner if symptoms worsen.      Informed Consent   Shared Decision Making/Informed Consent The risks [chest pain, shortness of breath, cardiac arrhythmias, dizziness, blood pressure fluctuations, myocardial infarction, stroke/transient ischemic attack, nausea, vomiting, allergic reaction, radiation exposure, metallic taste sensation and life-threatening complications (estimated to be 1 in 10,000)], benefits (risk stratification, diagnosing coronary artery disease, treatment guidance) and alternatives of a nuclear stress test were discussed in detail with Ms. Chiaramonte and she agrees to proceed.      Signed,  Yates Decamp, MD, [facility] 02/28/2023, 11:30 AM [facility] [address] [place], [place] [address] Phone: [phone]. Fax:  [phone]

## 2023-02-28 ENCOUNTER — Ambulatory Visit: Payer: Medicare Other | Attending: Cardiology | Admitting: Cardiology

## 2023-02-28 ENCOUNTER — Encounter: Payer: Self-pay | Admitting: Cardiology

## 2023-02-28 ENCOUNTER — Encounter: Payer: Self-pay | Admitting: *Deleted

## 2023-02-28 VITALS — BP 142/82 | HR 61 | Resp 16 | Ht 64.0 in | Wt 166.4 lb

## 2023-02-28 DIAGNOSIS — I10 Essential (primary) hypertension: Secondary | ICD-10-CM | POA: Diagnosis not present

## 2023-02-28 DIAGNOSIS — E7800 Pure hypercholesterolemia, unspecified: Secondary | ICD-10-CM | POA: Diagnosis not present

## 2023-02-28 DIAGNOSIS — R072 Precordial pain: Secondary | ICD-10-CM

## 2023-02-28 DIAGNOSIS — E119 Type 2 diabetes mellitus without complications: Secondary | ICD-10-CM

## 2023-02-28 MED ORDER — AMLODIPINE BESYLATE 5 MG PO TABS: 5.0000 mg | ORAL_TABLET | Freq: Every day | ORAL | Status: DC

## 2023-02-28 NOTE — Patient Instructions (Signed)
Medication Instructions:  Your physician has recommended you make the following change in your medication: Resume amlodipine 5 mg by mouth daily   *If you need a refill on your cardiac medications before your next appointment, please call your pharmacy*   Lab Work: none If you have labs (blood work) drawn today and your tests are completely normal, you will receive your results only by: MyChart Message (if you have MyChart) OR A paper copy in the mail If you have any lab test that is abnormal or we need to change your treatment, we will call you to review the results.   Testing/Procedures: Your physician has requested that you have an exercise stress myoview. For further information please visit [email or web address] Please follow instruction sheet, as given.     Follow-Up: At [facility], you and your health needs are our priority.  As part of our continuing mission to provide you with exceptional heart care, we have created designated Provider Care Teams.  These Care Teams include your primary Cardiologist (physician) and Advanced Practice Providers (APPs -  Physician Assistants and Nurse Practitioners) who all work together to provide you with the care you need, when you need it.  We recommend signing up for the patient portal called "MyChart".  Sign up information is provided on this After Visit Summary.  MyChart is used to connect with patients for Virtual Visits (Telemedicine).  Patients are able to view lab/test results, encounter notes, upcoming appointments, etc.  Non-urgent messages can be sent to your provider as well.   To learn more about what you can do with MyChart, go to [email or web address].    Your next appointment:   12 month(s)  Provider:   Yates Decamp, MD     Other Instructions

## 2023-03-03 ENCOUNTER — Encounter: Payer: Self-pay | Admitting: Pharmacist

## 2023-03-03 ENCOUNTER — Encounter (HOSPITAL_COMMUNITY): Payer: Self-pay

## 2023-03-16 ENCOUNTER — Ambulatory Visit (HOSPITAL_COMMUNITY): Payer: Medicare Other

## 2023-03-17 ENCOUNTER — Encounter (HOSPITAL_COMMUNITY): Payer: Self-pay | Admitting: *Deleted

## 2023-03-22 ENCOUNTER — Ambulatory Visit (HOSPITAL_COMMUNITY): Payer: Medicare Other | Attending: Cardiology

## 2023-03-22 DIAGNOSIS — R072 Precordial pain: Secondary | ICD-10-CM | POA: Insufficient documentation

## 2023-03-22 LAB — MYOCARDIAL PERFUSION IMAGING
Angina Index: 0
Duke Treadmill Score: 5
Estimated workload: 6.4
Exercise duration (min): 4 min
Exercise duration (sec): 31 s
LV dias vol: 47 mL (ref 46–106)
LV sys vol: 10 mL
MPHR: 144 {beats}/min
Nuc Stress EF: 79 %
Peak HR: 146 {beats}/min
Percent HR: 101 %
Rest HR: 73 {beats}/min
Rest Nuclear Isotope Dose: 10.2 mCi
SDS: 1
SRS: 4
SSS: 6
ST Depression (mm): 0 mm
Stress Nuclear Isotope Dose: 31.3 mCi
TID: 1.01

## 2023-03-22 MED ORDER — TECHNETIUM TC 99M TETROFOSMIN IV KIT
31.3000 | PACK | Freq: Once | INTRAVENOUS | Status: AC | PRN
  Administered 2023-03-22: 31.3 via INTRAVENOUS

## 2023-03-22 MED ORDER — TECHNETIUM TC 99M TETROFOSMIN IV KIT
10.2000 | PACK | Freq: Once | INTRAVENOUS | Status: AC | PRN
  Administered 2023-03-22: 10.2 via INTRAVENOUS

## 2023-03-22 NOTE — Progress Notes (Signed)
NOrmal stress test

## 2023-03-29 ENCOUNTER — Other Ambulatory Visit: Payer: Self-pay

## 2023-04-05 ENCOUNTER — Ambulatory Visit: Payer: Medicare Other | Admitting: Cardiology

## 2023-04-06 ENCOUNTER — Other Ambulatory Visit: Payer: Self-pay

## 2023-04-06 DIAGNOSIS — I10 Essential (primary) hypertension: Secondary | ICD-10-CM

## 2023-04-06 MED ORDER — VALSARTAN-HYDROCHLOROTHIAZIDE 320-25 MG PO TABS: 1.0000 | ORAL_TABLET | Freq: Every day | ORAL | 1 refills | Status: DC

## 2023-04-06 MED ORDER — AMLODIPINE BESYLATE 5 MG PO TABS: 5.0000 mg | ORAL_TABLET | Freq: Every day | ORAL | 1 refills | Status: DC

## 2023-04-19 ENCOUNTER — Other Ambulatory Visit: Payer: Self-pay

## 2023-04-19 DIAGNOSIS — I10 Essential (primary) hypertension: Secondary | ICD-10-CM

## 2023-04-19 MED ORDER — VALSARTAN-HYDROCHLOROTHIAZIDE 320-25 MG PO TABS: 1.0000 | ORAL_TABLET | Freq: Every day | ORAL | 1 refills | Status: DC

## 2023-07-17 NOTE — Progress Notes (Signed)
 [place], CMA,acting as a Neurosurgeon for Susanna Epley, FNP.,have documented all relevant documentation on the behalf of Susanna Epley, FNP,as directed by  Susanna Epley, FNP while in the presence of Susanna Epley, FNP.  Subjective:    Patient ID: Julia Gates , female    DOB: 1945/12/01 , 77 y.o.   MRN: [id number]  Chief Complaint  Patient presents with   Annual Exam    Patient presents today for HM, Patient reports compliance with medication. Patient denies any chest pain, SOB, or headaches. Patient has no concerns today.     HPI  Here for HM. She takes her valsartan  daily but sometimes will miss her amlodipine . She feels her ankles are swelling due to the amlodipine , she was restarted on this in November. She would like to switch back to verapamil .      Past Medical History:  Diagnosis Date   Abnormal glucose level    High cholesterol    Hypertension    Lumbago    Sciatica    Vitamin D  deficiency      Family History  Problem Relation Age of Onset   Heart disease Mother    Diabetes Mother    Diabetes Brother    Diabetes Sister      Current Outpatient Medications:    amLODipine  (NORVASC ) 5 MG tablet, Take 1 tablet (5 mg total) by mouth daily., Disp: 90 tablet, Rfl: 1   Ascorbic Acid (VITAMIN C) 100 MG tablet, Take 100 mg by mouth as needed., Disp: , Rfl:    atorvastatin  (LIPITOR) 20 MG tablet, Take 1 tablet (20 mg total) by mouth at bedtime., Disp: 90 tablet, Rfl: 1   cholecalciferol (VITAMIN D3) 25 MCG (1000 UT) tablet, Take 1,000 Units by mouth daily., Disp: , Rfl:    co-enzyme Q-10 30 MG capsule, Take 30 mg by mouth daily., Disp: , Rfl:    Multiple Vitamin (MULTIVITAMIN) tablet, Take 1 tablet by mouth daily., Disp: , Rfl:    NON FORMULARY, Green protein drink, Disp: , Rfl:    NON FORMULARY, Black seed drink, Disp: , Rfl:    Omega-3-6-9 CAPS, Take by mouth., Disp: , Rfl:    spironolactone  (ALDACTONE ) 25 MG tablet, TAKE ONE TABLET BY MOUTH EVERY DAY, Disp: 90 tablet,  Rfl: 1   valsartan -hydrochlorothiazide  (DIOVAN -HCT) 320-25 MG tablet, Take 1 tablet by mouth daily., Disp: 90 tablet, Rfl: 1   Allergies  Allergen Reactions   Amlodipine  Besylate Swelling   Penicillins Rash      The patient states she uses post menopausal status for birth control. No LMP recorded. Patient is postmenopausal.. Negative for Dysmenorrhea and Negative for Menorrhagia. Negative for: breast discharge, breast lump(s), breast pain and breast self exam. Associated symptoms include abnormal vaginal bleeding. Pertinent negatives include abnormal bleeding (hematology), anxiety, decreased libido, depression, difficulty falling sleep, dyspareunia, history of infertility, nocturia, sexual dysfunction, sleep disturbances, urinary incontinence, urinary urgency, vaginal discharge and vaginal itching. Diet regular; eats chicken, fish and vegetables and occasionally potatoes and rice.  The patient states her exercise level is minimal - gets on treadmill 2-3 times a week for about 45 minutes.   The patient's tobacco use is:  Social History   Tobacco Use  Smoking Status Never  Smokeless Tobacco Never   She has been exposed to passive smoke. The patient's alcohol use is:  Social History   Substance and Sexual Activity  Alcohol Use No    Review of Systems  Constitutional: Negative.   HENT: Negative.  Eyes: Negative.   Respiratory: Negative.    Cardiovascular: Negative.   Gastrointestinal: Negative.   Endocrine: Negative.   Genitourinary: Negative.   Musculoskeletal: Negative.   Skin: Negative.   Allergic/Immunologic: Negative.   Neurological: Negative.   Hematological: Negative.   Psychiatric/Behavioral: Negative.       Today's Vitals   07/18/23 0906  BP: 130/80  Pulse: 77  Temp: 98 F (36.7 C)  TempSrc: Oral  Weight: 161 lb 9.6 oz (73.3 kg)  Height: 5\' 1"  (1.549 m)  PainSc: 0-No pain   Body mass index is 30.53 kg/m.  Wt Readings from Last 3 Encounters:  07/18/23  161 lb 9.6 oz (73.3 kg)  03/22/23 166 lb (75.3 kg)  02/28/23 166 lb 6.4 oz (75.5 kg)     Objective:  Physical Exam Vitals and nursing note reviewed.  Constitutional:      General: She is not in acute distress.    Appearance: Normal appearance. She is well-developed.  HENT:     Head: Normocephalic and atraumatic.     Right Ear: Hearing, tympanic membrane, ear canal and external ear normal. There is no impacted cerumen.     Left Ear: Hearing, tympanic membrane, ear canal and external ear normal. There is no impacted cerumen.     Nose: Nose normal.     Mouth/Throat:     Mouth: Mucous membranes are moist.  Eyes:     General: Lids are normal.     Extraocular Movements: Extraocular movements intact.     Conjunctiva/sclera: Conjunctivae normal.     Pupils: Pupils are equal, round, and reactive to light.     Funduscopic exam:    Right eye: No papilledema.        Left eye: No papilledema.  Neck:     Thyroid : No thyroid  mass.     Vascular: No carotid bruit.  Cardiovascular:     Rate and Rhythm: Normal rate and regular rhythm.     Pulses: Normal pulses.     Heart sounds: Normal heart sounds. No murmur heard. Pulmonary:     Effort: Pulmonary effort is normal. No respiratory distress.     Breath sounds: Normal breath sounds. No wheezing.  Chest:     Chest wall: No mass.  Breasts:    Tanner Score is 5.     Right: Normal. No mass or tenderness.     Left: Normal. No mass or tenderness.  Abdominal:     General: Abdomen is flat. Bowel sounds are normal. There is no distension.     Palpations: Abdomen is soft.     Tenderness: There is no abdominal tenderness.  Genitourinary:    Comments: N/A Musculoskeletal:        General: No swelling or tenderness. Normal range of motion.     Cervical back: Full passive range of motion without pain, normal range of motion and neck supple.     Right knee: Normal range of motion. No tenderness.     Left knee: Normal range of motion. No tenderness.      Right lower leg: No edema.     Left lower leg: No edema.     Comments: Bilateral crepitus both knees  Lymphadenopathy:     Upper Body:     Right upper body: No supraclavicular, axillary or pectoral adenopathy.     Left upper body: No supraclavicular, axillary or pectoral adenopathy.  Skin:    General: Skin is warm and dry.     Capillary Refill: Capillary refill takes  less than 2 seconds.  Neurological:     General: No focal deficit present.     Mental Status: She is alert and oriented to person, place, and time.     Cranial Nerves: No cranial nerve deficit.     Sensory: No sensory deficit.     Motor: No weakness.  Psychiatric:        Mood and Affect: Mood normal.        Behavior: Behavior normal.        Thought Content: Thought content normal.        Judgment: Judgment normal.        Assessment And Plan:     Encounter for annual health examination Assessment & Plan: Behavior modifications discussed and diet history reviewed.   Pt will continue to exercise regularly and modify diet with low GI, plant based foods and decrease intake of processed foods.  Recommend intake of daily multivitamin, Vitamin D , and calcium .  Recommend mammogram for preventive screenings, as well as recommend immunizations that include influenza, TDAP, and Shingles    Age-related osteoporosis without current pathological fracture Assessment & Plan: Discussed importance of low impact walking to strengthen bones.   Essential hypertension Assessment & Plan: Blood pressure is better controlled, she is on amlodipine  but wants to stop due to " swelling to ankles"  Orders: -     POCT URINALYSIS DIP (CLINITEK) -     Microalbumin / creatinine urine ratio -     CMP14+EGFR  Mixed hyperlipidemia Assessment & Plan: Cholesterol levels are slightly elevated at last visit, will recheck lipid panel.   Orders: -     Lipid panel  Type 2 diabetes mellitus with hyperlipidemia (HCC) Assessment &  Plan: A1c remains at 6.6 she does not want to start a diabetes medication. Cholesterol levels were slightly increased at last visit. Discussed risk of not treating her diabetes, verbalizes understanding.  Orders: -     POCT URINALYSIS DIP (CLINITEK) -     Microalbumin / creatinine urine ratio -     Hemoglobin A1c -     Lipid panel  Chronic pain of both knees Assessment & Plan: Bilateral crepitus, she declines a pain cream or referral to orthopedics   Patient declines to take medication Assessment & Plan: She declines wanting to start diabetes medication. Discussed starting Jardiance as this can    Class 1 obesity due to excess calories with body mass index (BMI) of 30.0 to 30.9 in adult, unspecified whether serious comorbidity present Assessment & Plan: She is encouraged to strive for BMI less than 30 to decrease cardiac risk. Advised to aim for at least 150 minutes of exercise per week.    COVID-19 vaccination declined Assessment & Plan: Patient declined influenza vaccination at this time. Patient is aware that influenza vaccine prevents illness in 70% of healthy people, and reduces hospitalizations to 30-70% in elderly. This vaccine is recommended annually. Education has been provided regarding the importance of this vaccine but patient still declined. Advised may receive this vaccine at local pharmacy or Health Dept.or vaccine clinic. Aware to provide a copy of the vaccination record if obtained from local pharmacy or Health Dept.  Pt is willing to accept risk associated with refusing vaccination.    Herpes zoster vaccination declined Assessment & Plan: Declines shingrix , educated on disease process and is aware if he changes his mind to notify office    Other long term (current) drug therapy -     CBC with Differential/Platelet  Return for 1 year physical, 6 month bp check. Patient was given opportunity to ask questions. Patient verbalized understanding of the plan  and was able to repeat key elements of the plan. All questions were answered to their satisfaction.   Susanna Epley, FNP  I, Susanna Epley, FNP, have reviewed all documentation for this visit. The documentation on 07/18/23 for the exam, diagnosis, procedures, and orders are all accurate and complete.

## 2023-07-18 ENCOUNTER — Encounter: Payer: Self-pay | Admitting: Nurse Practitioner

## 2023-07-18 ENCOUNTER — Ambulatory Visit: Payer: Medicare Other | Admitting: Nurse Practitioner

## 2023-07-18 ENCOUNTER — Ambulatory Visit (INDEPENDENT_AMBULATORY_CARE_PROVIDER_SITE_OTHER): Payer: Self-pay | Admitting: Nurse Practitioner

## 2023-07-18 VITALS — BP 130/80 | HR 77 | Temp 98.0°F | Ht 61.0 in | Wt 161.6 lb

## 2023-07-18 DIAGNOSIS — E782 Mixed hyperlipidemia: Secondary | ICD-10-CM | POA: Insufficient documentation

## 2023-07-18 DIAGNOSIS — E6609 Other obesity due to excess calories: Secondary | ICD-10-CM | POA: Insufficient documentation

## 2023-07-18 DIAGNOSIS — E1169 Type 2 diabetes mellitus with other specified complication: Secondary | ICD-10-CM | POA: Diagnosis not present

## 2023-07-18 DIAGNOSIS — R7303 Prediabetes: Secondary | ICD-10-CM

## 2023-07-18 DIAGNOSIS — M810 Age-related osteoporosis without current pathological fracture: Secondary | ICD-10-CM | POA: Diagnosis not present

## 2023-07-18 DIAGNOSIS — E785 Hyperlipidemia, unspecified: Secondary | ICD-10-CM | POA: Diagnosis not present

## 2023-07-18 DIAGNOSIS — Z0000 Encounter for general adult medical examination without abnormal findings: Secondary | ICD-10-CM | POA: Diagnosis not present

## 2023-07-18 DIAGNOSIS — Z2821 Immunization not carried out because of patient refusal: Secondary | ICD-10-CM

## 2023-07-18 DIAGNOSIS — Z6830 Body mass index (BMI) 30.0-30.9, adult: Secondary | ICD-10-CM

## 2023-07-18 DIAGNOSIS — I10 Essential (primary) hypertension: Secondary | ICD-10-CM

## 2023-07-18 DIAGNOSIS — Z5320 Procedure and treatment not carried out because of patient's decision for unspecified reasons: Secondary | ICD-10-CM | POA: Insufficient documentation

## 2023-07-18 DIAGNOSIS — M25562 Pain in left knee: Secondary | ICD-10-CM

## 2023-07-18 DIAGNOSIS — E66811 Obesity, class 1: Secondary | ICD-10-CM

## 2023-07-18 DIAGNOSIS — Z79899 Other long term (current) drug therapy: Secondary | ICD-10-CM | POA: Diagnosis not present

## 2023-07-18 DIAGNOSIS — M25561 Pain in right knee: Secondary | ICD-10-CM

## 2023-07-18 DIAGNOSIS — G8929 Other chronic pain: Secondary | ICD-10-CM

## 2023-07-18 LAB — POCT URINALYSIS DIP (CLINITEK)
Bilirubin, UA: NEGATIVE
Blood, UA: NEGATIVE
Glucose, UA: NEGATIVE mg/dL
Ketones, POC UA: NEGATIVE mg/dL
Leukocytes, UA: NEGATIVE
Nitrite, UA: NEGATIVE
Spec Grav, UA: 1.025 (ref 1.010–1.025)
Urobilinogen, UA: 0.2 U/dL
pH, UA: 5.5 (ref 5.0–8.0)

## 2023-07-18 NOTE — Assessment & Plan Note (Signed)

## 2023-07-18 NOTE — Assessment & Plan Note (Signed)
 Bilateral crepitus, she declines a pain cream or referral to orthopedics

## 2023-07-18 NOTE — Assessment & Plan Note (Signed)
 Cholesterol levels are slightly elevated at last visit, will recheck lipid panel.

## 2023-07-18 NOTE — Assessment & Plan Note (Signed)
 A1c remains at 6.6 she does not want to start a diabetes medication. Cholesterol levels were slightly increased at last visit. Discussed risk of not treating her diabetes, verbalizes understanding.

## 2023-07-18 NOTE — Assessment & Plan Note (Signed)
Behavior modifications discussed and diet history reviewed.   Pt will continue to exercise regularly and modify diet with low GI, plant based foods and decrease intake of processed foods.  Recommend intake of daily multivitamin, Vitamin D, and calcium.  Recommend mammogram for preventive screenings, as well as recommend immunizations that include influenza, TDAP, and Shingles

## 2023-07-18 NOTE — Assessment & Plan Note (Signed)
 Blood pressure is better controlled, she is on amlodipine  but wants to stop due to " swelling to ankles"

## 2023-07-18 NOTE — Assessment & Plan Note (Signed)
 Declines shingrix, educated on disease process and is aware if he changes his mind to notify office

## 2023-07-18 NOTE — Assessment & Plan Note (Signed)
 She is encouraged to strive for BMI less than 30 to decrease cardiac risk. Advised to aim for at least 150 minutes of exercise per week.

## 2023-07-18 NOTE — Assessment & Plan Note (Signed)
 She declines wanting to start diabetes medication. Discussed starting Jardiance as this can

## 2023-07-19 LAB — MICROALBUMIN / CREATININE URINE RATIO
Creatinine, Urine: 193.6 mg/dL
Microalb/Creat Ratio: 8 mg/g{creat} (ref 0–29)
Microalbumin, Urine: 16.2 ug/mL

## 2023-07-19 LAB — CBC WITH DIFFERENTIAL/PLATELET

## 2023-07-19 LAB — HEMOGLOBIN A1C
Est. average glucose Bld gHb Est-mCnc: 148 mg/dL
Hgb A1c MFr Bld: 6.8 % — ABNORMAL HIGH (ref 4.8–5.6)

## 2023-07-19 LAB — CMP14+EGFR
ALT: 18 IU/L (ref 0–32)
AST: 22 IU/L (ref 0–40)
Albumin: 4.6 g/dL (ref 3.8–4.8)
Alkaline Phosphatase: 68 IU/L (ref 44–121)
BUN/Creatinine Ratio: 15 (ref 12–28)
BUN: 15 mg/dL (ref 8–27)
Bilirubin Total: 0.5 mg/dL (ref 0.0–1.2)
CO2: 22 mmol/L (ref 20–29)
Calcium: 9.7 mg/dL (ref 8.7–10.3)
Chloride: 102 mmol/L (ref 96–106)
Creatinine, Ser: 0.98 mg/dL (ref 0.57–1.00)
Globulin, Total: 2.6 g/dL (ref 1.5–4.5)
Glucose: 100 mg/dL — ABNORMAL HIGH (ref 70–99)
Potassium: 4.1 mmol/L (ref 3.5–5.2)
Sodium: 140 mmol/L (ref 134–144)
Total Protein: 7.2 g/dL (ref 6.0–8.5)
eGFR: 60 mL/min/{1.73_m2} (ref >59)

## 2023-07-19 LAB — LIPID PANEL
Chol/HDL Ratio: 3.4 ratio (ref 0.0–4.4)
Cholesterol, Total: 191 mg/dL (ref 100–199)
HDL: 57 mg/dL (ref >39)
LDL Chol Calc (NIH): 119 mg/dL — ABNORMAL HIGH (ref 0–99)
Triglycerides: 85 mg/dL (ref 0–149)
VLDL Cholesterol Cal: 15 mg/dL (ref 5–40)

## 2023-07-30 ENCOUNTER — Ambulatory Visit: Payer: Self-pay | Admitting: Nurse Practitioner

## 2023-07-30 DIAGNOSIS — M810 Age-related osteoporosis without current pathological fracture: Secondary | ICD-10-CM | POA: Insufficient documentation

## 2023-07-30 NOTE — Assessment & Plan Note (Signed)
 Discussed importance of low impact walking to strengthen bones.

## 2023-08-21 ENCOUNTER — Other Ambulatory Visit: Payer: Self-pay | Admitting: Nurse Practitioner

## 2023-08-21 DIAGNOSIS — E7800 Pure hypercholesterolemia, unspecified: Secondary | ICD-10-CM

## 2023-08-21 DIAGNOSIS — I10 Essential (primary) hypertension: Secondary | ICD-10-CM

## 2023-08-28 ENCOUNTER — Other Ambulatory Visit: Payer: Self-pay | Admitting: Nurse Practitioner

## 2023-08-28 DIAGNOSIS — I10 Essential (primary) hypertension: Secondary | ICD-10-CM

## 2023-08-28 DIAGNOSIS — E7800 Pure hypercholesterolemia, unspecified: Secondary | ICD-10-CM

## 2023-08-28 NOTE — Telephone Encounter (Unsigned)
 Copied from CRM [id number]. Topic: Clinical - Medication Refill >> Aug 28, 2023  2:48 PM Julia Gates wrote: Medication: spironolactone  (ALDACTONE ) 25 MG tablet  and atorvastatin  (LIPITOR) 20 MG tablet  Has the patient contacted their pharmacy? Yes  This is the patient's preferred pharmacy:  [facility] - [place], [place] - [address] [facility] [address] [place] [address] [place] [address] Phone: [phone] Fax: [phone]   Is this the correct pharmacy for this prescription? Yes   Has the prescription been filled recently? Yes. Last refilled on 08/22/23  Is the patient out of the medication? No. Patient has less 10 pills for both medicines  Has the patient been seen for an appointment in the last year OR does the patient have an upcoming appointment? Yes. Last ov was on 07/18/23 with [name]  Can we respond through MyChart? No. Contact patient by phone at [phone]  Agent: Please be advised that Rx refills may take up to 3 business days. We ask that you follow-up with your pharmacy.

## 2023-08-29 MED ORDER — ATORVASTATIN CALCIUM 20 MG PO TABS: 20.0000 mg | ORAL_TABLET | Freq: Every day | ORAL | 1 refills | Status: DC

## 2023-08-29 MED ORDER — SPIRONOLACTONE 25 MG PO TABS: 25.0000 mg | ORAL_TABLET | Freq: Every day | ORAL | 1 refills | Status: DC

## 2023-08-31 ENCOUNTER — Other Ambulatory Visit

## 2023-08-31 DIAGNOSIS — Z79899 Other long term (current) drug therapy: Secondary | ICD-10-CM

## 2023-08-31 LAB — CBC
Hematocrit: 39.2 % (ref 34.0–46.6)
Hemoglobin: 13.1 g/dL (ref 11.1–15.9)
MCH: 31.4 pg (ref 26.6–33.0)
MCHC: 33.4 g/dL (ref 31.5–35.7)
MCV: 94 fL (ref 79–97)
Platelets: 181 10*3/uL (ref 150–450)
RBC: 4.17 x10E6/uL (ref 3.77–5.28)
RDW: 13 % (ref 11.7–15.4)
WBC: 5.1 10*3/uL (ref 3.4–10.8)

## 2023-11-18 ENCOUNTER — Encounter: Payer: Self-pay | Admitting: *Deleted

## 2023-11-18 LAB — AMB RESULTS CONSOLE CBG: Glucose: 150

## 2024-01-18 ENCOUNTER — Encounter: Payer: Self-pay | Admitting: Nurse Practitioner

## 2024-01-18 ENCOUNTER — Ambulatory Visit: Payer: Self-pay | Admitting: Nurse Practitioner

## 2024-01-18 VITALS — BP 140/80 | HR 73 | Temp 97.7°F | Ht 61.0 in | Wt 155.0 lb

## 2024-01-18 DIAGNOSIS — Z1211 Encounter for screening for malignant neoplasm of colon: Secondary | ICD-10-CM

## 2024-01-18 DIAGNOSIS — E782 Mixed hyperlipidemia: Secondary | ICD-10-CM | POA: Diagnosis not present

## 2024-01-18 DIAGNOSIS — E1169 Type 2 diabetes mellitus with other specified complication: Secondary | ICD-10-CM

## 2024-01-18 DIAGNOSIS — M25561 Pain in right knee: Secondary | ICD-10-CM

## 2024-01-18 DIAGNOSIS — I10 Essential (primary) hypertension: Secondary | ICD-10-CM

## 2024-01-18 DIAGNOSIS — W19XXXA Unspecified fall, initial encounter: Secondary | ICD-10-CM

## 2024-01-18 DIAGNOSIS — G8929 Other chronic pain: Secondary | ICD-10-CM

## 2024-01-18 DIAGNOSIS — E785 Hyperlipidemia, unspecified: Secondary | ICD-10-CM | POA: Diagnosis not present

## 2024-01-18 DIAGNOSIS — I8393 Asymptomatic varicose veins of bilateral lower extremities: Secondary | ICD-10-CM

## 2024-01-18 DIAGNOSIS — Z2821 Immunization not carried out because of patient refusal: Secondary | ICD-10-CM

## 2024-01-18 LAB — LIPID PANEL
Chol/HDL Ratio: 2.4 ratio (ref 0.0–4.4)
Cholesterol, Total: 132 mg/dL (ref 100–199)
HDL: 54 mg/dL (ref >39)
LDL Chol Calc (NIH): 68 mg/dL (ref 0–99)
Triglycerides: 41 mg/dL (ref 0–149)
VLDL Cholesterol Cal: 10 mg/dL (ref 5–40)

## 2024-01-18 LAB — BMP8+EGFR
BUN/Creatinine Ratio: 14 (ref 12–28)
BUN: 15 mg/dL (ref 8–27)
CO2: 24 mmol/L (ref 20–29)
Calcium: 10 mg/dL (ref 8.7–10.3)
Chloride: 98 mmol/L (ref 96–106)
Creatinine, Ser: 1.09 mg/dL — ABNORMAL HIGH (ref 0.57–1.00)
Glucose: 90 mg/dL (ref 70–99)
Potassium: 4 mmol/L (ref 3.5–5.2)
Sodium: 136 mmol/L (ref 134–144)
eGFR: 52 mL/min/1.73 — ABNORMAL LOW (ref >59)

## 2024-01-18 LAB — HEMOGLOBIN A1C
Est. average glucose Bld gHb Est-mCnc: 134 mg/dL
Hgb A1c MFr Bld: 6.3 % — ABNORMAL HIGH (ref 4.8–5.6)

## 2024-01-18 NOTE — Assessment & Plan Note (Addendum)
 Blood pressure elevated at 140/90 mmHg. Reports non-adherence to medication regimen. Weight loss may aid in control. - Rechecked blood pressure before leaving clinic. - Advised setting alarm for medication reminder. - Suggested taking medication at 7 PM. - Encouraged contacting Dr. Ladona for potential medication adjustments.  She is missing her evening dose of medications and encouraged to set an alarm to take or put medications by bed.

## 2024-01-18 NOTE — Assessment & Plan Note (Addendum)
 She has a keloid area to her right knee, I do not feels

## 2024-01-18 NOTE — Assessment & Plan Note (Addendum)
 Cholesterol levels are slightly improved at last visit, will recheck lipid panel.

## 2024-01-18 NOTE — Assessment & Plan Note (Addendum)
 Currently diet controlled.  On atorvastatin  for cholesterol management. - Continue atorvastatin  as prescribed.

## 2024-01-18 NOTE — Progress Notes (Signed)
 LILLETTE Kristeen JINNY Gladis, CMA,acting as a neurosurgeon for [name], FNP.,have documented all relevant documentation on the behalf of [name], FNP,as directed by  [name], FNP while in the presence of [name], FNP.  Subjective:  Patient ID: Julia Gates , female    DOB: [birth year] , 78 y.o.   MRN: [id number]  Chief Complaint  Patient presents with   Hypertension    Patient presents today for a bp and dm follow up, Patient reports compliance with medication. Patient denies any chest pain, SOB, or headaches.    Fall    Patient reports she fell about 6 months ago outside and now has a mark on her right knee, she reports she feels like something is in it.  Patient reports she is also still having pain the knee.     HPI  Discussed the use of AI scribe software for clinical note transcription with the patient, who gave verbal consent to proceed.  History of Present Illness Julia Gates is a 78 year old female with hypertension who presents for a follow-up visit.  Her blood pressure has been elevated for the past six to seven weeks, with readings generally in the 150s. She forgets to take her medication at night, attributing it to her early rising and naps that extend into late night. She has not contacted her doctor about the elevated readings. She receives her medications, including atorvastatin , from [facility] and is unsure if she needs a refill for her blood pressure medication.  She has lost six pounds since her last visit and ten pounds since January by cutting out carbohydrates and sweet teas, which she believes is helping her blood pressure.  She mentions a fall that occurred about six months ago, resulting in knee pain that she describes as intense and tender to the touch, though it does not last long. She did not seek medical attention after the fall. She is concerned there might be something in her knee, as it feels like a keloid has formed.  She has not been to the eye doctor  since her previous doctor retired in July, and her next appointment is scheduled for February 2026. She has received messages to make an appointment but has not done so yet.  She notes that her veins are more visible and describes them as 'popping everywhere.' She does not currently use compression socks.  Past Medical History:  Diagnosis Date   Abnormal glucose level    High cholesterol    Hypertension    Lumbago    Sciatica    Vitamin D  deficiency      Family History  Problem Relation Age of Onset   Heart disease Mother    Diabetes Mother    Diabetes Brother    Diabetes Sister      Current Outpatient Medications:    amLODipine  (NORVASC ) 5 MG tablet, Take 1 tablet (5 mg total) by mouth daily., Disp: 90 tablet, Rfl: 1   Ascorbic Acid (VITAMIN C) 100 MG tablet, Take 100 mg by mouth as needed., Disp: , Rfl:    atorvastatin  (LIPITOR) 20 MG tablet, Take 1 tablet (20 mg total) by mouth at bedtime., Disp: 90 tablet, Rfl: 1   cholecalciferol (VITAMIN D3) 25 MCG (1000 UT) tablet, Take 1,000 Units by mouth daily., Disp: , Rfl:    co-enzyme Q-10 30 MG capsule, Take 30 mg by mouth daily., Disp: , Rfl:    Multiple Vitamin (MULTIVITAMIN) tablet, Take 1 tablet by mouth daily., Disp: , Rfl:  NON FORMULARY, Green protein drink, Disp: , Rfl:    NON FORMULARY, Black seed drink, Disp: , Rfl:    Omega-3-6-9 CAPS, Take by mouth., Disp: , Rfl:    spironolactone  (ALDACTONE ) 25 MG tablet, Take 1 tablet (25 mg total) by mouth daily., Disp: 90 tablet, Rfl: 1   valsartan -hydrochlorothiazide  (DIOVAN -HCT) 320-25 MG tablet, Take 1 tablet by mouth daily., Disp: 90 tablet, Rfl: 1   Allergies  Allergen Reactions   Amlodipine  Besylate Swelling   Penicillins Rash     Review of Systems  Constitutional: Negative.   HENT: Negative.    Eyes: Negative.   Respiratory: Negative.    Cardiovascular: Negative.   Gastrointestinal: Negative.   Musculoskeletal: Negative.   Neurological: Negative.    Psychiatric/Behavioral: Negative.       Today's Vitals   01/18/24 0922 01/18/24 1011  BP: (!) 140/90 (!) 140/80  Pulse: 73   Temp: 97.7 F (36.5 C)   TempSrc: Oral   Weight: 155 lb (70.3 kg)   Height: 5' 1 (1.549 m)   PainSc: 0-No pain    Body mass index is 29.29 kg/m.  Wt Readings from Last 3 Encounters:  01/18/24 155 lb (70.3 kg)  07/18/23 161 lb 9.6 oz (73.3 kg)  03/22/23 166 lb (75.3 kg)      Objective:  Physical Exam Vitals reviewed.  Constitutional:      General: She is not in acute distress.    Appearance: Normal appearance.  Cardiovascular:     Rate and Rhythm: Normal rate and regular rhythm.     Pulses: Normal pulses.     Heart sounds: Normal heart sounds. No murmur heard. Pulmonary:     Effort: Pulmonary effort is normal. No respiratory distress.     Breath sounds: Normal breath sounds. No wheezing.  Skin:    General: Skin is warm and dry.     Capillary Refill: Capillary refill takes less than 2 seconds.  Neurological:     General: No focal deficit present.     Mental Status: She is alert and oriented to person, place, and time.     Cranial Nerves: No cranial nerve deficit.     Motor: No weakness.  Psychiatric:        Mood and Affect: Mood normal.        Behavior: Behavior normal.        Thought Content: Thought content normal.        Judgment: Judgment normal.       Diabetic foot exam was performed with the following findings:   No deformities, ulcerations, or other skin breakdown Normal sensation of 10g monofilament Intact posterior tibialis and dorsalis pedis pulses Decreased sensation left heel     Assessment And Plan:   Assessment & Plan Essential hypertension Blood pressure elevated at 140/90 mmHg. Reports non-adherence to medication regimen. Weight loss may aid in control. - Rechecked blood pressure before leaving clinic. - Advised setting alarm for medication reminder. - Suggested taking medication at 7 PM. - Encouraged  contacting Dr. Ladona for potential medication adjustments.  She is missing her evening dose of medications and encouraged to set an alarm to take or put medications by bed.  Type 2 diabetes mellitus with hyperlipidemia (HCC) Currently diet controlled.  On atorvastatin  for cholesterol management. - Continue atorvastatin  as prescribed. Mixed hyperlipidemia Cholesterol levels are slightly improved at last visit, will recheck lipid panel.  Influenza vaccination declined  Herpes zoster vaccination declined  Encounter for screening colonoscopy I have placed a referral  to Dr. Kristie was due for her colonoscopy in 2024. Patient is aware. Asymptomatic varicose veins of both lower extremities Visible varicose veins due to thinning skin, not using compression socks. - Recommended wearing compression socks. Chronic pain of right knee She has a keloid area to her right knee, I do not feels  Fall, initial encounter She experienced a fall outside on the gravel about 6 months ago, did not seek medical attention. She injured her knee and feels like there is something in the area. No palpable firm nodules/masses  Orders Placed This Encounter  Procedures   Hemoglobin A1c   Lipid panel   BMP8+eGFR   Ambulatory referral to Gastroenterology     Return for KEEP SAME NEXT.  Patient was given opportunity to ask questions. Patient verbalized understanding of the plan and was able to repeat key elements of the plan. All questions were answered to their satisfaction.    LILLETTE [name], FNP, have reviewed all documentation for this visit. The documentation on 01/18/24 for the exam, diagnosis, procedures, and orders are all accurate and complete.   IF YOU HAVE BEEN REFERRED TO A SPECIALIST, IT MAY TAKE 1-2 WEEKS TO SCHEDULE/PROCESS THE REFERRAL. IF YOU HAVE NOT HEARD FROM US /SPECIALIST IN TWO WEEKS, PLEASE GIVE US  A CALL AT [phone].

## 2024-01-18 NOTE — Assessment & Plan Note (Addendum)
 I have placed a referral to Dr. Kristie was due for her colonoscopy in 2024. Patient is aware.

## 2024-01-18 NOTE — Assessment & Plan Note (Addendum)
 She experienced a fall outside on the gravel about 6 months ago, did not seek medical attention. She injured her knee and feels like there is something in the area. No palpable firm nodules/masses

## 2024-01-18 NOTE — Assessment & Plan Note (Addendum)
 Visible varicose veins due to thinning skin, not using compression socks. - Recommended wearing compression socks.

## 2024-02-06 ENCOUNTER — Other Ambulatory Visit: Payer: Self-pay | Admitting: Nurse Practitioner

## 2024-02-06 DIAGNOSIS — I10 Essential (primary) hypertension: Secondary | ICD-10-CM

## 2024-02-06 NOTE — Telephone Encounter (Unsigned)
 Copied from CRM #[id number]. Topic: Clinical - Medication Refill >> Feb 06, 2024 11:17 AM Amber H wrote: Medication: valsartan -hydrochlorothiazide  (DIOVAN -HCT) 320-25 MG tablet   Has the patient contacted their pharmacy? Yes, [facility] advised her to contact her provider.  (Agent: If no, request that the patient contact the pharmacy for the refill. If patient does not wish to contact the pharmacy document the reason why and proceed with request.) (Agent: If yes, when and what did the pharmacy advise?)   [facility] [address] [place], [place] - [address] [place] [address] Phone: [phone] Fax: [phone]  Is this the correct pharmacy for this prescription? Yes If no, delete pharmacy and type the correct one.   Has the prescription been filled recently? No, 08/2023  Is the patient out of the medication? No, has 6 more left.   Has the patient been seen for an appointment in the last year OR does the patient have an upcoming appointment? Yes  Can we respond through MyChart? No, wants phone call.   Agent: Please be advised that Rx refills may take up to 3 business days. We ask that you follow-up with your pharmacy.

## 2024-02-08 MED ORDER — VALSARTAN-HYDROCHLOROTHIAZIDE 320-25 MG PO TABS: 1.0000 | ORAL_TABLET | Freq: Every day | ORAL | 1 refills | Status: DC

## 2024-03-13 NOTE — Progress Notes (Signed)
 " Cardiology Office Note:  .   Date:  03/14/2024  ID:  [name], DOB 20-Sep-1945, MRN [id number] PCP: Georgina Speaks, FNP   HeartCare Providers Cardiologist:  Gordy Bergamo, MD   History of Present Illness: .   Julia Gates is a 79 y.o.  African-American female with difficult control hypertension with hypertensive heart disease without heart failure, diet-controlled diabetes. She does not want to be on statins, she is also very particular about the medications. She has had a low risk nuclear stress test on 03/22/2023 without evidence of ischemia however her exercise tolerance was reduced at 4 minutes and 31 seconds  Echocardiogram on 07/31/2018 essentially normal with mild MR and mild to moderate TR without pulmonary hypertension.    Discussed the use of AI scribe software for clinical note transcription with the patient, who gave verbal consent to proceed.  History of Present Illness Julia Gates is a 79 year old female with hypertension who presents with elevated blood pressure.  Her blood pressure has been elevated for about three months with home readings between 150 and 190 mmHg. She often forgets her nighttime amlodipine  5 mg and stopped it after about two months because she did not feel any benefit. She is taking valsartan  HCT and spironolactone  regularly but is worried spironolactone  is depleting a mineral, though she cannot specify which.  She is not taking atorvastatin  and instead relies on dietary changes, including omega-3 intake, for cholesterol. She has lost 12 pounds recently and is trying to maintain this.  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 She has occasional chest pressure that she relates to high blood pressure. A recent stress test and cardiac perfusion scan were normal.  Cardiac Studies relevent.    MYOCARDIAL PERFUSION IMAGING 03/22/2023   A Bruce protocol  stress test was performed. Exercise capacity was mildly impaired. Patient exercised for 4 min and 31 sec. Maximum HR of 146 bpm. MPHR 101.0%. Peak METS 6.4. The patient experienced no angina during the test. The test was stopped because the patient experienced fatigue. The patient reported fatigue during the stress test. Elevated blood pressure response noted during stress, borderline for true hypertensive response (219/90 mmHg).   No ST deviation was noted. Arrhythmias during stress: rare PVCs. Arrhythmias during recovery: occasional PVCs. Right bundle branch block was seen throughout study. The ECG was negative for ischemia.   LV perfusion is normal. There is no evidence of ischemia. There is no evidence of infarction.   Left ventricular function is normal. Nuclear stress EF: 79%. The left ventricular ejection fraction is hyperdynamic (>65%). End diastolic cavity size is normal. End systolic cavity size is normal. No evidence of transient ischemic dilation (TID) noted.   Prior study not available for comparison.  EKG:   EKG Interpretation Date/Time:  Thursday March 14 2024 08:59:28 EST Ventricular Rate:  77 PR Interval:  176 QRS Duration:  140 QT Interval:  412 QTC Calculation: 466 R Axis:   -57  Text Interpretation: EKG 03/14/2024: Normal sinus rhythm at rate of 77 bpm, left anterior fascicular block, right bundle branch block.  Bifascicular block.  LVH.  Compared to 02/28/2023, left axis deviation is new. Confirmed by Shadia Larose, Jagadeesh [phone]) on 03/14/2024 9:27:32 AM  Labs   Lab Results  Component Value Date   CHOL 132 01/18/2024   HDL 54 01/18/2024   LDLCALC 68 01/18/2024   TRIG 41 01/18/2024  CHOLHDL 2.4 01/18/2024   No results found for: LIPOA  Recent Labs    07/18/23 1003 01/18/24 1004  NA 140 136  K 4.1 4.0  CL 102 98  CO2 22 24  GLUCOSE 100* 90  BUN 15 15  CREATININE 0.98 1.09*  CALCIUM  9.7 10.0    Lab Results  Component Value Date   ALT 18 07/18/2023   AST 22  07/18/2023   ALKPHOS 68 07/18/2023   BILITOT 0.5 07/18/2023      Latest Ref Rng & Units 08/31/2023    9:23 AM 07/18/2023   10:03 AM 07/14/2022   10:14 AM  CBC  WBC 3.4 - 10.8 x10E3/uL 5.1  CANCELED  4.9   Hemoglobin 11.1 - 15.9 g/dL 86.8  CANCELED  86.2   Hematocrit 34.0 - 46.6 % 39.2  CANCELED  39.6   Platelets 150 - 450 x10E3/uL 181  CANCELED  196    Lab Results  Component Value Date   HGBA1C 6.3 (H) 01/18/2024    Lab Results  Component Value Date   TSH 3.890 03/22/2022    ROS  Review of Systems  Cardiovascular:  Negative for chest pain, dyspnea on exertion and leg swelling.   Physical Exam:   VS:  BP (!) 178/90 (BP Location: Left Arm, Patient Position: Sitting, Cuff Size: Normal)   Pulse 79   Resp 16   Ht 5' 1 (1.549 m)   Wt 149 lb 14.4 oz (68 kg)   SpO2 97%   BMI 28.32 kg/m    Wt Readings from Last 3 Encounters:  03/14/24 149 lb 14.4 oz (68 kg)  01/18/24 155 lb (70.3 kg)  07/18/23 161 lb 9.6 oz (73.3 kg)    BP Readings from Last 3 Encounters:  03/14/24 (!) 178/90  01/18/24 (!) 140/80  07/18/23 130/80   Physical Exam Neck:     Vascular: No carotid bruit or JVD.  Cardiovascular:     Rate and Rhythm: Normal rate and regular rhythm.     Pulses: Intact distal pulses.     Heart sounds: Normal heart sounds. No murmur heard.    No gallop.  Pulmonary:     Effort: Pulmonary effort is normal.     Breath sounds: Normal breath sounds.  Abdominal:     General: Bowel sounds are normal.     Palpations: Abdomen is soft.  Musculoskeletal:     Right lower leg: No edema.     Left lower leg: No edema.    ASSESSMENT AND PLAN: .      ICD-10-CM   1. Primary hypertension  I10 EKG 12-Lead    amLODIPine -Valsartan -HCTZ 10-320-25 MG TABS    2. Type 2 diabetes mellitus without complication, without long-term current use of insulin (HCC)  E11.9     3. Pure hypercholesterolemia  E78.00     4. Essential hypertension  I10 spironolactone  (ALDACTONE ) 25 MG tablet      Assessment & Plan Primary hypertension Hypertension is poorly controlled with blood pressure readings ranging from 150 to 190 mmHg. Non-adherence to amlodipine  due to forgetfulness and perceived ineffectiveness. EKG changes suggest uncontrolled hypertension. No evidence of cardiac blockages. Recent stress test and cardiac perfusion scan were normal. Reports intermittent chest pressure, likely related to elevated blood pressure. - Prescribed combination pill containing amlodipine , valsartan , and hydrochlorothiazide  to improve adherence. - Continue spironolactone  at current dose, 25 mg daily. - Monitor blood pressure and symptoms. - Will consider clonidine  patch if blood pressure remains uncontrolled after two months. - Scheduled  follow-up with PA or nurse practitioner in two months to assess medication efficacy. - Medication compliance discussed with the patient  Pure hypercholesterolemia Cholesterol levels have decreased significantly, likely due to increased omega-3 supplementation. Atorvastatin  was not effective in lowering cholesterol levels. - Continue omega-3 supplementation. - She prefers not to be on Statins  Diet controlled diabetes mellitus without complications -A1c is well-controlled at 6.0. - Reinforced her diet.   Follow up: 2 months and if stable on a as needed basis  Signed,  Gordy Bergamo, MD, [facility] 03/14/2024, 12:25 PM [facility] [address] [place], [place] [address] Phone: [phone]. Fax:  [phone]  "

## 2024-03-14 ENCOUNTER — Ambulatory Visit: Attending: Cardiology | Admitting: Cardiology

## 2024-03-14 ENCOUNTER — Encounter: Payer: Self-pay | Admitting: Cardiology

## 2024-03-14 VITALS — BP 178/90 | HR 79 | Resp 16 | Ht 61.0 in | Wt 149.9 lb

## 2024-03-14 DIAGNOSIS — I10 Essential (primary) hypertension: Secondary | ICD-10-CM | POA: Diagnosis not present

## 2024-03-14 DIAGNOSIS — E7800 Pure hypercholesterolemia, unspecified: Secondary | ICD-10-CM | POA: Diagnosis not present

## 2024-03-14 DIAGNOSIS — E119 Type 2 diabetes mellitus without complications: Secondary | ICD-10-CM | POA: Diagnosis not present

## 2024-03-14 MED ORDER — AMLODIPINE-VALSARTAN-HCTZ 10-320-25 MG PO TABS: 1.0000 | ORAL_TABLET | ORAL | 0 refills | Status: DC

## 2024-03-14 MED ORDER — SPIRONOLACTONE 25 MG PO TABS: 25.0000 mg | ORAL_TABLET | Freq: Every day | ORAL | 3 refills | Status: AC

## 2024-03-14 NOTE — Patient Instructions (Addendum)
 Medication Instructions:  START amLODIPine -Valsartan -HCTZ 10-320-25 MG TABS         Take 1 tablet by mouth every morning   *If you need a refill on your cardiac medications before your next appointment, please call your pharmacy*    Follow-Up: At [facility], you and your health needs are our priority.  As part of our continuing mission to provide you with exceptional heart care, our providers are all part of one team.  This team includes your primary Cardiologist (physician) and Advanced Practice Providers or APPs (Physician Assistants and Nurse Practitioners) who all work together to provide you with the care you need, when you need it.  Your next appointment:   May 13, 2024 @ 9:15  Provider:   Damien Braver, NP         We recommend signing up for the patient portal called MyChart.  Patients are able to view lab/test results, encounter notes, upcoming appointments, etc.  Non-urgent messages can be sent to your provider as well, go to [email or web address].

## 2024-03-26 ENCOUNTER — Telehealth: Payer: Self-pay | Admitting: Cardiology

## 2024-03-26 DIAGNOSIS — I10 Essential (primary) hypertension: Secondary | ICD-10-CM

## 2024-03-26 MED ORDER — HYDROCHLOROTHIAZIDE 25 MG PO TABS: 25.0000 mg | ORAL_TABLET | Freq: Every day | ORAL | 1 refills | Status: AC

## 2024-03-26 MED ORDER — AMLODIPINE BESYLATE-VALSARTAN 10-320 MG PO TABS: 1.0000 | ORAL_TABLET | Freq: Every day | ORAL | 1 refills | Status: DC

## 2024-03-26 NOTE — Telephone Encounter (Signed)
"    ICD-10-CM   1. Primary hypertension  I10 amLODipine -valsartan  (EXFORGE ) 10-320 MG tablet    hydrochlorothiazide  (HYDRODIURIL ) 25 MG tablet     Meds ordered this encounter  Medications   amLODipine -valsartan  (EXFORGE ) 10-320 MG tablet    Sig: Take 1 tablet by mouth daily.    Dispense:  100 tablet    Refill:  1   hydrochlorothiazide  (HYDRODIURIL ) 25 MG tablet    Sig: Take 1 tablet (25 mg total) by mouth daily.    Dispense:  100 each    Refill:  1   Medications Discontinued During This Encounter  Medication Reason   amLODIPine -Valsartan -HCTZ 10-320-25 MG TABS Not covered by the pt's insurance    "

## 2024-03-26 NOTE — Addendum Note (Signed)
 Addended by: LADONA MILAN on: 03/26/2024 02:04 PM   Modules accepted: Orders

## 2024-03-26 NOTE — Telephone Encounter (Signed)
 Pt c/o medication issue:  1. Name of Medication: amLODIPine -Valsartan -HCTZ 10-320-25 MG TABS   2. How are you currently taking this medication (dosage and times per day)?   3. Are you having a reaction (difficulty breathing--STAT)? No   4. What is your medication issue? Pt states [facility] does not carry this medication as a combination. She would like to get them seperately. Please advise.

## 2024-04-03 ENCOUNTER — Ambulatory Visit

## 2024-04-03 ENCOUNTER — Other Ambulatory Visit: Payer: Self-pay | Admitting: Cardiology

## 2024-04-03 DIAGNOSIS — I10 Essential (primary) hypertension: Secondary | ICD-10-CM

## 2024-04-10 ENCOUNTER — Ambulatory Visit

## 2024-05-13 ENCOUNTER — Ambulatory Visit: Admitting: Nurse Practitioner

## 2024-07-18 ENCOUNTER — Encounter: Payer: Self-pay | Admitting: Nurse Practitioner
